# Patient Record
Sex: Female | Born: 1945
Health system: Southern US, Community
[De-identification: ages and names within clinical notes are randomized; demographics above are authoritative.]

## PROBLEM LIST (undated history)

## (undated) DIAGNOSIS — M5136 Other intervertebral disc degeneration, lumbar region: Secondary | ICD-10-CM

## (undated) DIAGNOSIS — E039 Hypothyroidism, unspecified: Secondary | ICD-10-CM

## (undated) DIAGNOSIS — G20 Parkinson's disease: Secondary | ICD-10-CM

## (undated) DIAGNOSIS — M51369 Other intervertebral disc degeneration, lumbar region without mention of lumbar back pain or lower extremity pain: Secondary | ICD-10-CM

## (undated) DIAGNOSIS — G20A1 Parkinson's disease without dyskinesia, without mention of fluctuations: Secondary | ICD-10-CM

## (undated) DIAGNOSIS — IMO0002 Reserved for concepts with insufficient information to code with codable children: Secondary | ICD-10-CM

## (undated) DIAGNOSIS — E785 Hyperlipidemia, unspecified: Secondary | ICD-10-CM

## (undated) DIAGNOSIS — R87629 Unspecified abnormal cytological findings in specimens from vagina: Secondary | ICD-10-CM

## (undated) DIAGNOSIS — I10 Essential (primary) hypertension: Secondary | ICD-10-CM

## (undated) DIAGNOSIS — K649 Unspecified hemorrhoids: Secondary | ICD-10-CM

## (undated) DIAGNOSIS — E079 Disorder of thyroid, unspecified: Secondary | ICD-10-CM

## (undated) DIAGNOSIS — R87619 Unspecified abnormal cytological findings in specimens from cervix uteri: Secondary | ICD-10-CM

## (undated) DIAGNOSIS — F419 Anxiety disorder, unspecified: Secondary | ICD-10-CM

## (undated) HISTORY — DX: Disorder of thyroid, unspecified: E07.9

## (undated) HISTORY — DX: Unspecified hemorrhoids: K64.9

## (undated) HISTORY — PX: OTHER SURGICAL HISTORY: SHX169

## (undated) HISTORY — PX: EYE SURGERY: SHX253

## (undated) HISTORY — DX: Reserved for concepts with insufficient information to code with codable children: IMO0002

## (undated) HISTORY — DX: Unspecified abnormal cytological findings in specimens from vagina: R87.629

## (undated) HISTORY — DX: Unspecified abnormal cytological findings in specimens from cervix uteri: R87.619

---

## 2000-10-20 ENCOUNTER — Encounter: Payer: Self-pay | Admitting: Obstetrics and Gynecology

## 2000-10-20 ENCOUNTER — Ambulatory Visit (HOSPITAL_COMMUNITY): Admission: RE | Admit: 2000-10-20 | Discharge: 2000-10-20 | Payer: Self-pay | Admitting: Obstetrics and Gynecology

## 2001-10-26 ENCOUNTER — Encounter: Payer: Self-pay | Admitting: Obstetrics and Gynecology

## 2001-10-26 ENCOUNTER — Ambulatory Visit (HOSPITAL_COMMUNITY): Admission: RE | Admit: 2001-10-26 | Discharge: 2001-10-26 | Payer: Self-pay | Admitting: Obstetrics and Gynecology

## 2002-11-01 ENCOUNTER — Encounter: Payer: Self-pay | Admitting: Obstetrics and Gynecology

## 2002-11-01 ENCOUNTER — Ambulatory Visit (HOSPITAL_COMMUNITY): Admission: RE | Admit: 2002-11-01 | Discharge: 2002-11-01 | Payer: Self-pay | Admitting: Obstetrics and Gynecology

## 2003-11-15 ENCOUNTER — Ambulatory Visit (HOSPITAL_COMMUNITY): Admission: RE | Admit: 2003-11-15 | Discharge: 2003-11-15 | Payer: Self-pay | Admitting: Obstetrics and Gynecology

## 2003-11-23 ENCOUNTER — Encounter: Admission: RE | Admit: 2003-11-23 | Discharge: 2003-11-23 | Payer: Self-pay | Admitting: Obstetrics and Gynecology

## 2004-11-26 ENCOUNTER — Encounter: Admission: RE | Admit: 2004-11-26 | Discharge: 2004-11-26 | Payer: Self-pay | Admitting: Obstetrics and Gynecology

## 2005-11-16 ENCOUNTER — Encounter (INDEPENDENT_AMBULATORY_CARE_PROVIDER_SITE_OTHER): Payer: Self-pay | Admitting: *Deleted

## 2005-11-16 ENCOUNTER — Ambulatory Visit (HOSPITAL_COMMUNITY): Admission: RE | Admit: 2005-11-16 | Discharge: 2005-11-16 | Payer: Self-pay | Admitting: General Surgery

## 2005-11-30 ENCOUNTER — Encounter: Admission: RE | Admit: 2005-11-30 | Discharge: 2005-11-30 | Payer: Self-pay | Admitting: Obstetrics and Gynecology

## 2006-01-19 HISTORY — PX: OTHER SURGICAL HISTORY: SHX169

## 2006-03-02 ENCOUNTER — Ambulatory Visit (HOSPITAL_COMMUNITY): Admission: RE | Admit: 2006-03-02 | Discharge: 2006-03-02 | Payer: Self-pay | Admitting: Obstetrics and Gynecology

## 2006-03-11 ENCOUNTER — Encounter: Admission: RE | Admit: 2006-03-11 | Discharge: 2006-03-11 | Payer: Self-pay | Admitting: Obstetrics and Gynecology

## 2006-05-03 ENCOUNTER — Ambulatory Visit: Payer: Self-pay | Admitting: Internal Medicine

## 2006-05-03 ENCOUNTER — Ambulatory Visit (HOSPITAL_COMMUNITY): Admission: RE | Admit: 2006-05-03 | Discharge: 2006-05-03 | Payer: Self-pay | Admitting: Internal Medicine

## 2006-11-08 ENCOUNTER — Encounter: Admission: RE | Admit: 2006-11-08 | Discharge: 2006-11-08 | Payer: Self-pay | Admitting: Obstetrics and Gynecology

## 2007-01-20 HISTORY — PX: COLONOSCOPY: SHX174

## 2007-09-23 ENCOUNTER — Other Ambulatory Visit: Admission: RE | Admit: 2007-09-23 | Discharge: 2007-09-23 | Payer: Self-pay | Admitting: Obstetrics and Gynecology

## 2007-11-09 ENCOUNTER — Encounter: Admission: RE | Admit: 2007-11-09 | Discharge: 2007-11-09 | Payer: Self-pay | Admitting: Obstetrics and Gynecology

## 2009-01-01 ENCOUNTER — Encounter: Admission: RE | Admit: 2009-01-01 | Discharge: 2009-01-01 | Payer: Self-pay | Admitting: Obstetrics and Gynecology

## 2009-04-26 ENCOUNTER — Other Ambulatory Visit: Admission: RE | Admit: 2009-04-26 | Discharge: 2009-04-26 | Payer: Self-pay | Admitting: Obstetrics and Gynecology

## 2010-01-31 ENCOUNTER — Encounter
Admission: RE | Admit: 2010-01-31 | Discharge: 2010-01-31 | Payer: Self-pay | Source: Home / Self Care | Attending: Obstetrics and Gynecology | Admitting: Obstetrics and Gynecology

## 2010-02-08 ENCOUNTER — Encounter: Payer: Self-pay | Admitting: Obstetrics and Gynecology

## 2010-02-08 ENCOUNTER — Encounter: Payer: Self-pay | Admitting: Internal Medicine

## 2010-02-09 ENCOUNTER — Encounter: Payer: Self-pay | Admitting: Obstetrics and Gynecology

## 2010-06-06 NOTE — Op Note (Signed)
NAMEJONAYA, Julie Farrell                 ACCOUNT NO.:  [id number]   MEDICAL RECORD NO.:  [id number]          PATIENT TYPE:  AMB   LOCATION:  DAY                           FACILITY:  [facility]   PHYSICIAN:  Lionel December, M.D.    DATE OF BIRTH:  [birth year]   DATE OF PROCEDURE:  05/03/2006  DATE OF DISCHARGE:                               OPERATIVE REPORT   PROCEDURE:  Colonoscopy.   INDICATIONS FOR PROCEDURE:  [name] is a 65 year old Caucasian female who  is undergoing evaluation screening colonoscopy.  Procedure risks were  reviewed with the patient and informed consent was obtained.   MEDICATIONS FOR CONSCIOUS SEDATION:  Demerol 50 mg IV and Versed 8 mg IV  in divided dose.   FINDINGS:  Procedure performed in endoscopy suite.  The patient's vital  signs and O2 sat were monitored during the procedure and remained  stable.  The patient was placed in the left lateral position and rectal  examination performed.  No abnormality noted on external or digital  exam.  The Pentax video scope was placed in the rectum and advanced  under direct vision into the sigmoid colon beyond.  Preparation was  excellent.  The scope was passed through the cecum which was identified  by the appendiceal orifice and ileocecal valve.  Short segment of TI was  also examined and was normal.  Colonic mucosa was examined carefully on  the way out.  No polyps or other mucosal abnormalities were noted.  Rectal mucosa similarly was normal.  The scope was retroflexed and  examined anorectal junction which is unremarkable.  The endoscope was  then withdrawn.  The patient tolerated the procedure well.   FINAL DIAGNOSIS:  Normal colonoscopy.   RECOMMENDATIONS:  1. Yearly Hemoccult.  2. Consider next screening exam in 10 years from now.      Lionel December, M.D.  Electronically Signed     NR/MEDQ  D:  05/03/2006  T:  05/03/2006  Job:  [id number]   cc:   Tilda Burrow, M.D.  Fax: [phone]

## 2010-06-06 NOTE — Op Note (Signed)
NAMESHATASHA, LAMBING                 ACCOUNT NO.:  [id number]   MEDICAL RECORD NO.:  [id number]          PATIENT TYPE:  AMB   LOCATION:  DAY                           FACILITY:  [facility]   PHYSICIAN:  Dalia Heading, M.D.  DATE OF BIRTH:  09-30-1945   DATE OF PROCEDURE:  11/16/2005  DATE OF DISCHARGE:                                 OPERATIVE REPORT   PREOPERATIVE DIAGNOSIS:  Neoplasm, left arm.   POSTOPERATIVE DIAGNOSIS:  Neoplasm, left arm.   PROCEDURE:  Excision of neoplasm, left arm.   SURGEON:  Dr. Franky Macho   ANESTHESIA:  General.   INDICATIONS:  The patient is a 65 year old white female who presents with an  enlarging subcutaneous mass in the left arm.  Risks and benefits of the  procedure including bleeding, infection, recurrence of the mass were fully  explained to the patient, who gave informed consent.   PROCEDURE NOTE:  The patient was placed in supine position.  The left arm  was prepped and draped in the usual sterile technique with Betadine.  Surgical site confirmation was performed.   A longitudinal incision was made on the lateral aspect of the left arm.  This was taken down to the mass.  The mass was excised without difficulty.  The appeared to be a lipoma, greater than 4 cm its greatest diameter.  The  specimen sent to pathology further examination.  Any bleeding was controlled  using Bovie electrocautery.  The subcutaneous layer was reapproximated using  a 3-0 Vicryl interrupted suture.  Skin was closed using a 4-0 Vicryl  subcuticular suture.  0.5% Sensorcaine was instilled in the surrounding  wound.  Dermabond was then applied.   All tape and needle counts correct the end the procedure.  The patient was  awakened and transferred to PACU in stable condition.   COMPLICATIONS:  None.   SPECIMEN:  Neoplasm, left arm.   BLOOD LOSS:  Minimal.      Dalia Heading, M.D.  Electronically Signed     MAJ/MEDQ  D:  11/16/2005  T:  11/16/2005  Job:   [id number]   cc:   Corrie Mckusick, M.D.  Fax: [phone]

## 2010-06-06 NOTE — H&P (Signed)
Julie Farrell, Julie Farrell                 ACCOUNT NO.:  [id number]   MEDICAL RECORD NO.:  [id number]          PATIENT TYPE:  AMB   LOCATION:                                FACILITY:  [facility]   PHYSICIAN:  Dalia Heading, M.D.  DATE OF BIRTH:  1945/09/02   DATE OF ADMISSION:  11/16/2005  DATE OF DISCHARGE:  LH                                HISTORY & PHYSICAL   CHIEF COMPLAINT:  Mass, left arm.   HISTORY OF PRESENT ILLNESS:  The patient is a 65 year old white female who  is referred for evaluation and treatment of a mass on the left arm.  It has  been present for some time, but is increasing in size and is causing her  discomfort.   PAST MEDICAL HISTORY:  Unremarkable.   PAST SURGICAL HISTORY:  Unremarkable.   CURRENT MEDICATIONS:  None.   ALLERGIES:  NO KNOWN DRUG ALLERGIES.   REVIEW OF SYSTEMS:  Noncontributory.   PHYSICAL EXAMINATION:  GENERAL:  The patient is a well-developed, well-  nourished white female in no acute distress.  LUNGS:  Clear to auscultation with equal breath sounds bilaterally.  HEART:  Examination reveals a regular rate and rhythm without S3, S4, or  murmurs.  EXTREMITIES:  Left arm exam reveals a 4-cm ovoid, deep, subcutaneous mass  present in the posterolateral aspect of the arm.   IMPRESSION:  Neoplasm, left arm, unspecified.   PLAN:  The patient is scheduled for an excision of the neoplasm, left arm,  on November 16, 2005.  The risks and benefits of the procedure including  bleeding, infection, and recurrence of the mass were fully explained to the  patient, who gave informed consent.      Dalia Heading, M.D.  Electronically Signed     MAJ/MEDQ  D:  11/10/2005  T:  11/11/2005  Job:  [id number]   cc:   Corrie Mckusick, M.D.  Fax: [phone]

## 2010-06-06 NOTE — Procedures (Signed)
NAMEJAKIYA, Julie Farrell                 ACCOUNT NO.:  [id number]   MEDICAL RECORD NO.:  [id number]          PATIENT TYPE:  AMB   LOCATION:  DAY                           FACILITY:  [facility]   PHYSICIAN:  Edward L. Juanetta Gosling, M.D.DATE OF BIRTH:  1945/01/29   DATE OF PROCEDURE:  11/12/2005  DATE OF DISCHARGE:                                EKG INTERPRETATION   The rhythm is sinus rhythm with a rate in the 80s.  There is left anterior  hemiblock and suggestion of a probable left bundle branch block.  There is  left atrial enlargement.  Q-waves were seen across the precordial leads that  is probably related to the bundle branch block which could indicate a  previous infarction.  Clinical correlation is suggested.  Abnormal  electrocardiogram.      Oneal Deputy. Juanetta Gosling, M.D.  Electronically Signed     ELH/MEDQ  D:  11/12/2005  T:  11/14/2005  Job:  [id number]

## 2010-08-21 ENCOUNTER — Other Ambulatory Visit (HOSPITAL_COMMUNITY)
Admission: RE | Admit: 2010-08-21 | Discharge: 2010-08-21 | Disposition: A | Discharge status: Home / Self Care | Payer: Self-pay | Source: Ambulatory Visit | Attending: Obstetrics and Gynecology | Admitting: Obstetrics and Gynecology

## 2010-08-21 ENCOUNTER — Other Ambulatory Visit: Payer: Self-pay | Admitting: Adult Health

## 2010-08-21 DIAGNOSIS — Z01419 Encounter for gynecological examination (general) (routine) without abnormal findings: Secondary | ICD-10-CM | POA: Insufficient documentation

## 2011-02-02 ENCOUNTER — Other Ambulatory Visit: Payer: Self-pay | Admitting: Obstetrics and Gynecology

## 2011-02-02 DIAGNOSIS — Z1231 Encounter for screening mammogram for malignant neoplasm of breast: Secondary | ICD-10-CM

## 2011-02-23 ENCOUNTER — Ambulatory Visit
Admission: RE | Admit: 2011-02-23 | Discharge: 2011-02-23 | Disposition: A | Discharge status: Home / Self Care | Payer: 59 | Source: Ambulatory Visit | Attending: Obstetrics and Gynecology | Admitting: Obstetrics and Gynecology

## 2011-02-23 DIAGNOSIS — Z1231 Encounter for screening mammogram for malignant neoplasm of breast: Secondary | ICD-10-CM

## 2011-11-10 ENCOUNTER — Other Ambulatory Visit (HOSPITAL_COMMUNITY): Payer: Self-pay | Admitting: Family Medicine

## 2011-11-10 DIAGNOSIS — Z139 Encounter for screening, unspecified: Secondary | ICD-10-CM

## 2011-11-13 ENCOUNTER — Other Ambulatory Visit (HOSPITAL_COMMUNITY): Payer: 59

## 2011-11-17 ENCOUNTER — Other Ambulatory Visit (HOSPITAL_COMMUNITY): Payer: 59

## 2011-11-24 ENCOUNTER — Ambulatory Visit (HOSPITAL_COMMUNITY)
Admission: RE | Admit: 2011-11-24 | Discharge: 2011-11-24 | Disposition: A | Discharge status: Home / Self Care | Payer: Medicare Other | Source: Ambulatory Visit | Attending: Family Medicine | Admitting: Family Medicine

## 2011-11-24 DIAGNOSIS — M899 Disorder of bone, unspecified: Secondary | ICD-10-CM | POA: Insufficient documentation

## 2011-11-24 DIAGNOSIS — Z780 Asymptomatic menopausal state: Secondary | ICD-10-CM | POA: Insufficient documentation

## 2011-11-24 DIAGNOSIS — Z139 Encounter for screening, unspecified: Secondary | ICD-10-CM

## 2011-11-24 DIAGNOSIS — M949 Disorder of cartilage, unspecified: Secondary | ICD-10-CM | POA: Insufficient documentation

## 2012-01-28 ENCOUNTER — Other Ambulatory Visit: Payer: Self-pay | Admitting: Obstetrics and Gynecology

## 2012-01-28 DIAGNOSIS — Z1231 Encounter for screening mammogram for malignant neoplasm of breast: Secondary | ICD-10-CM

## 2012-02-29 ENCOUNTER — Ambulatory Visit
Admission: RE | Admit: 2012-02-29 | Discharge: 2012-02-29 | Disposition: A | Discharge status: Home / Self Care | Payer: Medicare Other | Source: Ambulatory Visit | Attending: Obstetrics and Gynecology | Admitting: Obstetrics and Gynecology

## 2012-02-29 DIAGNOSIS — Z1231 Encounter for screening mammogram for malignant neoplasm of breast: Secondary | ICD-10-CM

## 2012-04-27 ENCOUNTER — Other Ambulatory Visit: Payer: Self-pay | Admitting: Adult Health

## 2012-05-10 ENCOUNTER — Encounter: Payer: Self-pay | Admitting: *Deleted

## 2012-05-10 ENCOUNTER — Other Ambulatory Visit: Payer: Self-pay

## 2012-05-10 ENCOUNTER — Encounter (HOSPITAL_COMMUNITY)
Admission: RE | Admit: 2012-05-10 | Discharge: 2012-05-10 | Disposition: A | Discharge status: Home / Self Care | Payer: Medicare Other | Source: Ambulatory Visit | Attending: Ophthalmology | Admitting: Ophthalmology

## 2012-05-10 ENCOUNTER — Encounter (HOSPITAL_COMMUNITY): Payer: Self-pay

## 2012-05-10 ENCOUNTER — Encounter (HOSPITAL_COMMUNITY): Payer: Self-pay | Admitting: Pharmacy Technician

## 2012-05-10 HISTORY — DX: Hyperlipidemia, unspecified: E78.5

## 2012-05-10 LAB — HEMOGLOBIN AND HEMATOCRIT, BLOOD: HCT: 39.3 % (ref 36.0–46.0)

## 2012-05-10 NOTE — Patient Instructions (Addendum)
Your procedure is scheduled on:  05/16/12  Report to [facility] at 8:00 AM.  Call this number if you have problems the morning of surgery: [phone]   Remember:   Do not eat or drink:After Midnight.  Take these medicines the morning of surgery with A SIP OF WATER: None   Do not wear jewelry, make-up or nail polish.  Do not wear lotions, powders, or perfumes. You may wear deodorant.  Do not shave 48 hours prior to surgery. Men may shave face and neck.  Do not bring valuables to the hospital.  Contacts, dentures or bridgework may not be worn into surgery.  Leave suitcase in the car. After surgery it may be brought to your room.  For patients admitted to the hospital, checkout time is 11:00 AM the day of discharge.   Patients discharged the day of surgery will not be allowed to drive home.    Special Instructions: Start using your eye drops before surgery as directed by your eye doctor.   Please read over the following fact sheets that you were given: Anesthesia Post-op Instructions    Cataract Surgery  A cataract is a clouding of the lens of the eye. When a lens becomes cloudy, vision is reduced based on the degree and nature of the clouding. Surgery may be needed to improve vision. Surgery removes the cloudy lens and usually replaces it with a substitute lens (intraocular lens, IOL). LET YOUR EYE DOCTOR KNOW ABOUT:  Allergies to food or medicine.  Medicines taken including herbs, eyedrops, over-the-counter medicines, and creams.  Use of steroids (by mouth or creams).  Previous problems with anesthetics or numbing medicine.  History of bleeding problems or blood clots.  Previous surgery.  Other health problems, including diabetes and kidney problems.  Possibility of pregnancy, if this applies. RISKS AND COMPLICATIONS  Infection.  Inflammation of the eyeball (endophthalmitis) that can spread to both eyes (sympathetic ophthalmia).  Poor wound healing.  If an IOL is  inserted, it can later fall out of proper position. This is very uncommon.  Clouding of the part of your eye that holds an IOL in place. This is called an "after-cataract." These are uncommon, but easily treated. BEFORE THE PROCEDURE  Do not eat or drink anything except small amounts of water for 8 to 12 before your surgery, or as directed by your caregiver.  Unless you are told otherwise, continue any eyedrops you have been prescribed.  Talk to your primary caregiver about all other medicines that you take (both prescription and non-prescription). In some cases, you may need to stop or change medicines near the time of your surgery. This is most important if you are taking blood-thinning medicine.Do not stop medicines unless you are told to do so.  Arrange for someone to drive you to and from the procedure.  Do not put contact lenses in either eye on the day of your surgery. PROCEDURE There is more than one method for safely removing a cataract. Your doctor can explain the differences and help determine which is best for you. Phacoemulsification surgery is the most common form of cataract surgery.  An injection is given behind the eye or eyedrops are given to make this a painless procedure.  A small cut (incision) is made on the edge of the clear, dome-shaped surface that covers the front of the eye (cornea).  A tiny probe is painlessly inserted into the eye. This device gives off ultrasound waves that soften and break up the cloudy  center of the lens. This makes it easier for the cloudy lens to be removed by suction.  An IOL may be implanted.  The normal lens of the eye is covered by a clear capsule. Part of that capsule is intentionally left in the eye to support the IOL.  Your surgeon may or may not use stitches to close the incision. There are other forms of cataract surgery that require a larger incision and stiches to close the eye. This approach is taken in cases where the  doctor feels that the cataract cannot be easily removed using phacoemulsification. AFTER THE PROCEDURE  When an IOL is implanted, it does not need care. It becomes a permanent part of your eye and cannot be seen or felt.  Your doctor will schedule follow-up exams to check on your progress.  Review your other medicines with your doctor to see which can be resumed after surgery.  Use eyedrops or take medicine as prescribed by your doctor. Document Released: 12/25/2010 Document Revised: 03/30/2011 Document Reviewed: 12/25/2010 [facility] Patient Information 2013 [place], [place].    PATIENT INSTRUCTIONS POST-ANESTHESIA  IMMEDIATELY FOLLOWING SURGERY:  Do not drive or operate machinery for the first twenty four hours after surgery.  Do not make any important decisions for twenty four hours after surgery or while taking narcotic pain medications or sedatives.  If you develop intractable nausea and vomiting or a severe headache please notify your doctor immediately.  FOLLOW-UP:  Please make an appointment with your surgeon as instructed. You do not need to follow up with anesthesia unless specifically instructed to do so.  WOUND CARE INSTRUCTIONS (if applicable):  Keep a dry clean dressing on the anesthesia/puncture wound site if there is drainage.  Once the wound has quit draining you may leave it open to air.  Generally you should leave the bandage intact for twenty four hours unless there is drainage.  If the epidural site drains for more than 36-48 hours please call the anesthesia department.  QUESTIONS?:  Please feel free to call your physician or the hospital operator if you have any questions, and they will be happy to assist you.

## 2012-05-11 ENCOUNTER — Other Ambulatory Visit (HOSPITAL_COMMUNITY)
Admission: RE | Admit: 2012-05-11 | Discharge: 2012-05-11 | Disposition: A | Discharge status: Home / Self Care | Payer: Medicare Other | Source: Ambulatory Visit | Attending: Adult Health | Admitting: Adult Health

## 2012-05-11 ENCOUNTER — Encounter (HOSPITAL_COMMUNITY): Payer: Self-pay | Admitting: Pharmacy Technician

## 2012-05-11 ENCOUNTER — Encounter: Payer: Self-pay | Admitting: Adult Health

## 2012-05-11 ENCOUNTER — Ambulatory Visit (INDEPENDENT_AMBULATORY_CARE_PROVIDER_SITE_OTHER): Payer: Medicare Other | Admitting: Adult Health

## 2012-05-11 VITALS — BP 130/82 | HR 76 | Ht 62.0 in | Wt 133.0 lb

## 2012-05-11 DIAGNOSIS — Z124 Encounter for screening for malignant neoplasm of cervix: Secondary | ICD-10-CM

## 2012-05-11 DIAGNOSIS — Z1212 Encounter for screening for malignant neoplasm of rectum: Secondary | ICD-10-CM

## 2012-05-11 DIAGNOSIS — Z01419 Encounter for gynecological examination (general) (routine) without abnormal findings: Secondary | ICD-10-CM | POA: Insufficient documentation

## 2012-05-11 DIAGNOSIS — Z0000 Encounter for general adult medical examination without abnormal findings: Secondary | ICD-10-CM

## 2012-05-11 DIAGNOSIS — R87810 Cervical high risk human papillomavirus (HPV) DNA test positive: Secondary | ICD-10-CM | POA: Insufficient documentation

## 2012-05-11 DIAGNOSIS — R5381 Other malaise: Secondary | ICD-10-CM

## 2012-05-11 DIAGNOSIS — Z1151 Encounter for screening for human papillomavirus (HPV): Secondary | ICD-10-CM | POA: Insufficient documentation

## 2012-05-11 DIAGNOSIS — H269 Unspecified cataract: Secondary | ICD-10-CM | POA: Insufficient documentation

## 2012-05-11 LAB — HEMOCCULT GUIAC POC 1CARD (OFFICE): Fecal Occult Blood, POC: NEGATIVE

## 2012-05-11 MED ORDER — PROVIDA OB 20-20-1.25 MG PO CAPS: 1.0000 | ORAL_CAPSULE | Freq: Every day | ORAL | Status: DC

## 2012-05-11 NOTE — Patient Instructions (Addendum)
Try vitamins mammogram yearly, colonoscopy in 2018 Follow up in 2 years  Sign up for my chart

## 2012-05-11 NOTE — Progress Notes (Signed)
Patient ID: Julie Farrell, female   DOB: [birth year], 67 y.o.   MRN: [id number] History of Present Illness: Julie Farrell is a 67 year old white female, divorced in for her pap and physical.She says she is tired, will get labs with Dr. Regino Schultze, declines today. Had pre op labs for cataracts to be done 05/16/12 at [facility], left eye.   Current Medications, Allergies, Past Medical History, Past Surgical History, Family History and Social History were reviewed in [employer] record.     Review of Systems:Patient denies any headaches, blurred vision, shortness of breath, chest pain, abdominal pain, problems with bowel movements, urination, she is not having sex. No musculoskeletal complaints or mood changes.She is tired at times and tried OTC geritol.    Physical Exam:Blood pressure 130/82, pulse 76, height 5\' 2"  (1.575 m), weight 133 lb (60.328 kg). General:  Well developed, well nourished, no acute distress Skin:  Warm and dry Neck:  Midline trachea, normal thyroid, no carotid bruits heard. Lungs; Clear to auscultation bilaterally Breast:  No dominant palpable mass, retraction, or nipple discharge(had 3D mammogram in [place]). Cardiovascular: Regular rate and rhythm Abdomen:  Soft, non tender, no hepatosplenomegaly Pelvic:  External genitalia is normal in appearance.  The vagina is atrophic. The cervix is atrophic. Pap done with HPV. Uterus is felt to be normal size, shape, and contour.  No adnexal masses or tenderness noted. Rectal: Good sphincter tone, no polyps, or hemorrhoids felt.  Hemoccult negative. Extremities:  No swelling or varicosities noted Psych:  No mood changes, alert and cooperative.   Impression: Yearly exam  Left cataract Fatigue  Plan: Try provida ob to see if feels better with vitamin Get labs with Dr. Regino Schultze Mammogram yearly Colonoscopy in 2018 Return in 2 years for pap and physical,prn problems

## 2012-05-13 MED ORDER — CYCLOPENTOLATE-PHENYLEPHRINE 0.2-1 % OP SOLN
OPHTHALMIC | Status: AC
  Filled 2012-05-13: qty 2

## 2012-05-13 MED ORDER — TETRACAINE HCL 0.5 % OP SOLN
OPHTHALMIC | Status: AC
  Filled 2012-05-13: qty 2

## 2012-05-13 MED ORDER — LIDOCAINE HCL 3.5 % OP GEL
OPHTHALMIC | Status: AC
  Filled 2012-05-13: qty 5

## 2012-05-13 MED ORDER — NEOMYCIN-POLYMYXIN-DEXAMETH 3.5-10000-0.1 OP OINT
TOPICAL_OINTMENT | OPHTHALMIC | Status: AC
  Filled 2012-05-13: qty 3.5

## 2012-05-13 MED ORDER — LIDOCAINE HCL (PF) 1 % IJ SOLN
INTRAMUSCULAR | Status: AC
  Filled 2012-05-13: qty 2

## 2012-05-13 MED ORDER — PHENYLEPHRINE HCL 2.5 % OP SOLN
OPHTHALMIC | Status: AC
  Filled 2012-05-13: qty 2

## 2012-05-16 ENCOUNTER — Encounter (HOSPITAL_COMMUNITY): Payer: Self-pay | Admitting: *Deleted

## 2012-05-16 ENCOUNTER — Encounter (HOSPITAL_COMMUNITY)
Admission: RE | Disposition: A | Discharge status: Home Health | Payer: Self-pay | Source: Ambulatory Visit | Attending: Ophthalmology

## 2012-05-16 ENCOUNTER — Encounter (HOSPITAL_COMMUNITY): Payer: Self-pay | Admitting: Anesthesiology

## 2012-05-16 ENCOUNTER — Ambulatory Visit (HOSPITAL_COMMUNITY): Payer: Medicare Other | Admitting: Anesthesiology

## 2012-05-16 ENCOUNTER — Ambulatory Visit (HOSPITAL_COMMUNITY)
Admission: RE | Admit: 2012-05-16 | Discharge: 2012-05-16 | Disposition: A | Discharge status: Home Health | Payer: Medicare Other | Source: Ambulatory Visit | Attending: Ophthalmology | Admitting: Ophthalmology

## 2012-05-16 DIAGNOSIS — H2510 Age-related nuclear cataract, unspecified eye: Secondary | ICD-10-CM | POA: Insufficient documentation

## 2012-05-16 DIAGNOSIS — Z01810 Encounter for preprocedural cardiovascular examination: Secondary | ICD-10-CM | POA: Insufficient documentation

## 2012-05-16 DIAGNOSIS — Z01812 Encounter for preprocedural laboratory examination: Secondary | ICD-10-CM | POA: Insufficient documentation

## 2012-05-16 DIAGNOSIS — H52209 Unspecified astigmatism, unspecified eye: Secondary | ICD-10-CM | POA: Insufficient documentation

## 2012-05-16 HISTORY — PX: CATARACT EXTRACTION W/PHACO: SHX586

## 2012-05-16 SURGERY — PHACOEMULSIFICATION, CATARACT, WITH IOL INSERTION
Anesthesia: Monitor Anesthesia Care | Site: Eye | Laterality: Left | Wound class: Clean

## 2012-05-16 MED ORDER — BSS IO SOLN
INTRAOCULAR | Status: DC | PRN
  Administered 2012-05-16: 15 mL via INTRAOCULAR

## 2012-05-16 MED ORDER — LACTATED RINGERS IV SOLN
INTRAVENOUS | Status: DC
  Administered 2012-05-16: 09:00:00 via INTRAVENOUS

## 2012-05-16 MED ORDER — PROVISC 10 MG/ML IO SOLN
INTRAOCULAR | Status: DC | PRN
  Administered 2012-05-16: 8.5 mg via INTRAOCULAR

## 2012-05-16 MED ORDER — MIDAZOLAM HCL 2 MG/2ML IJ SOLN
INTRAMUSCULAR | Status: AC
  Filled 2012-05-16: qty 2

## 2012-05-16 MED ORDER — MIDAZOLAM HCL 2 MG/2ML IJ SOLN
1.0000 mg | INTRAMUSCULAR | Status: DC | PRN
  Administered 2012-05-16 (×2): 2 mg via INTRAVENOUS

## 2012-05-16 MED ORDER — LACTATED RINGERS IV SOLN
INTRAVENOUS | Status: DC | PRN
  Administered 2012-05-16: 09:00:00 via INTRAVENOUS

## 2012-05-16 MED ORDER — LIDOCAINE HCL (PF) 1 % IJ SOLN
INTRAMUSCULAR | Status: DC | PRN
  Administered 2012-05-16: .7 mL

## 2012-05-16 MED ORDER — PHENYLEPHRINE HCL 2.5 % OP SOLN
1.0000 [drp] | OPHTHALMIC | Status: AC
  Administered 2012-05-16 (×3): 1 [drp] via OPHTHALMIC

## 2012-05-16 MED ORDER — POVIDONE-IODINE 5 % OP SOLN
OPHTHALMIC | Status: DC | PRN
  Administered 2012-05-16: 1 via OPHTHALMIC

## 2012-05-16 MED ORDER — CYCLOPENTOLATE-PHENYLEPHRINE 0.2-1 % OP SOLN
1.0000 [drp] | OPHTHALMIC | Status: AC
  Administered 2012-05-16 (×3): 1 [drp] via OPHTHALMIC

## 2012-05-16 MED ORDER — EPINEPHRINE HCL 1 MG/ML IJ SOLN
INTRAOCULAR | Status: DC | PRN
  Administered 2012-05-16: 10:00:00

## 2012-05-16 MED ORDER — EPINEPHRINE HCL 1 MG/ML IJ SOLN
INTRAMUSCULAR | Status: AC
  Filled 2012-05-16: qty 1

## 2012-05-16 MED ORDER — LIDOCAINE HCL 3.5 % OP GEL
1.0000 "application " | Freq: Once | OPHTHALMIC | Status: AC
  Administered 2012-05-16: 1 via OPHTHALMIC

## 2012-05-16 MED ORDER — TETRACAINE HCL 0.5 % OP SOLN
1.0000 [drp] | OPHTHALMIC | Status: AC
  Administered 2012-05-16 (×3): 1 [drp] via OPHTHALMIC

## 2012-05-16 MED ORDER — NEOMYCIN-POLYMYXIN-DEXAMETH 0.1 % OP OINT
TOPICAL_OINTMENT | OPHTHALMIC | Status: DC | PRN
  Administered 2012-05-16: 1 via OPHTHALMIC

## 2012-05-16 SURGICAL SUPPLY — 32 items
CAPSULAR TENSION RING-AMO (OPHTHALMIC RELATED) IMPLANT
CLOTH BEACON ORANGE TIMEOUT ST (SAFETY) ×2 IMPLANT
EYE SHIELD UNIVERSAL CLEAR (GAUZE/BANDAGES/DRESSINGS) ×2 IMPLANT
GLOVE BIO SURGEON STRL SZ 6.5 (GLOVE) IMPLANT
GLOVE BIOGEL PI IND STRL 6.5 (GLOVE) ×1 IMPLANT
GLOVE BIOGEL PI IND STRL 7.0 (GLOVE) ×1 IMPLANT
GLOVE BIOGEL PI IND STRL 7.5 (GLOVE) IMPLANT
GLOVE BIOGEL PI INDICATOR 6.5 (GLOVE) ×1
GLOVE BIOGEL PI INDICATOR 7.0 (GLOVE) ×1
GLOVE BIOGEL PI INDICATOR 7.5 (GLOVE)
GLOVE ECLIPSE 6.5 STRL STRAW (GLOVE) IMPLANT
GLOVE ECLIPSE 7.0 STRL STRAW (GLOVE) IMPLANT
GLOVE ECLIPSE 7.5 STRL STRAW (GLOVE) IMPLANT
GLOVE EXAM NITRILE LRG STRL (GLOVE) IMPLANT
GLOVE EXAM NITRILE MD LF STRL (GLOVE) IMPLANT
GLOVE SKINSENSE NS SZ6.5 (GLOVE)
GLOVE SKINSENSE NS SZ7.0 (GLOVE)
GLOVE SKINSENSE STRL SZ6.5 (GLOVE) IMPLANT
GLOVE SKINSENSE STRL SZ7.0 (GLOVE) IMPLANT
KIT VITRECTOMY (OPHTHALMIC RELATED) IMPLANT
LENS IOL ACRYSOF IQ TORIC 15.0 ×2 IMPLANT
PAD ARMBOARD 7.5X6 YLW CONV (MISCELLANEOUS) ×2 IMPLANT
PROC W NO LENS (INTRAOCULAR LENS)
PROC W SPEC LENS (INTRAOCULAR LENS) ×2
PROCESS W NO LENS (INTRAOCULAR LENS) IMPLANT
PROCESS W SPEC LENS (INTRAOCULAR LENS) ×1 IMPLANT
RING MALYGIN (MISCELLANEOUS) IMPLANT
SYR TB 1ML LL NO SAFETY (SYRINGE) ×2 IMPLANT
TAPE SURG TRANSPORE 1 IN (GAUZE/BANDAGES/DRESSINGS) IMPLANT
TAPE SURGICAL TRANSPORE 1 IN (GAUZE/BANDAGES/DRESSINGS) ×1
VISCOELASTIC ADDITIONAL (OPHTHALMIC RELATED) IMPLANT
WATER STERILE IRR 250ML POUR (IV SOLUTION) ×2 IMPLANT

## 2012-05-16 NOTE — Anesthesia Postprocedure Evaluation (Signed)
  Anesthesia Post-op Note  Patient: Julie Farrell  Procedure(s) Performed: Procedure(s) with comments: CATARACT EXTRACTION PHACO AND INTRAOCULAR LENS PLACEMENT (IOC) (Left) - CDE:15.20  Patient Location: Short Stay  Anesthesia Type:MAC  Level of Consciousness: awake, alert , oriented and patient cooperative  Airway and Oxygen Therapy: Patient Spontanous Breathing  Post-op Pain: none  Post-op Assessment: Post-op Vital signs reviewed, Patient's Cardiovascular Status Stable, Respiratory Function Stable, Patent Airway and No signs of Nausea or vomiting  Post-op Vital Signs: Reviewed and stable  Complications: No apparent anesthesia complications

## 2012-05-16 NOTE — Op Note (Signed)
Date of Admission: 05/16/12  Date of Surgery: 05/16/12  Pre-Op Dx: Cataract  Left Eye, Astigmatism 367.21  Post-Op Dx: Nuclear Cataract Left Eye, Dx Code 366.16, Astigmatism 367.21  Surgeon: Gemma Payor, M.D.  Assistants: None  Anesthesia: Topical with MAC  Indications: Painless, progressive loss of vision with compromise of daily activities.  Surgery: Cataract Extraction with Intraocular lens Implant Left Eye  Discription: The patient had dilating drops and viscous lidocaine placed into the left eye in the pre-op holding area. Marking of the horizontal meridian was performed. After transfer to the operating room, a time out was performed. The patient was then prepped and draped. Beginning with a 75 degree blade a paracentesis port was made at the surgeon's 2 o'clock position. The anterior chamber was then filled with 2% non-preserved lidocaine. This was followed by filling the anterior chamber with Provisc. A bent cystatome needle was used to create a continuous tear capsulotomy. Hydrodissection was performed with balanced salt solution on a Fine canula. The lens nucleus was then removed using the phacoemulsification handpiece. Residual cortex was removed with the I&A handpiece. The anterior chamber and capsular bag were refilled with Provisc. Marking on the 87/267 degree meridians were made. A posterior chamber intraocular lens was placed into the capsular bag with it's injector. The implant was positioned on the 87/267 meridians with the Kuglan hook. The Provisc was then removed from the anterior chamber and capsular bag with the I&A handpiece. Stromal hydration of the main incision and paracentesis port was performed with BSS on a Fine canula. The wounds were tested for leak which was negative. The patient tolerated the procedure well. There were no operative complications. The patient was then transferred to the recovery room in stable condition.  Prosthetic device:  STAAR Toric, [id number],  power 23.0/2.0D.  Specimen: None  EBL: None  Complications: None

## 2012-05-16 NOTE — H&P (Signed)
I have reviewed the H&P, the patient was re-examined, and I have identified no interval changes in medical condition and plan of care since the history and physical of record  

## 2012-05-16 NOTE — Anesthesia Procedure Notes (Signed)
Procedure Name: MAC Performed by: ANDRAZA, AMY L Pre-anesthesia Checklist: Patient identified, Timeout performed, Emergency Drugs available, Suction available and Patient being monitored Oxygen Delivery Method: Nasal cannula     

## 2012-05-16 NOTE — Preoperative (Signed)
Beta Blockers   Reason not to administer Beta Blockers:Not Applicable 

## 2012-05-16 NOTE — Transfer of Care (Signed)
Immediate Anesthesia Transfer of Care Note  Patient: Julie Farrell  Procedure(s) Performed: Procedure(s) with comments: CATARACT EXTRACTION PHACO AND INTRAOCULAR LENS PLACEMENT (IOC) (Left) - CDE:15.20  Patient Location: Short Stay  Anesthesia Type:MAC  Level of Consciousness: awake, alert , oriented and patient cooperative  Airway & Oxygen Therapy: Patient Spontanous Breathing  Post-op Assessment: Report given to PACU RN and Post -op Vital signs reviewed and stable  Post vital signs: Reviewed and stable  Complications: No apparent anesthesia complications

## 2012-05-16 NOTE — Anesthesia Preprocedure Evaluation (Signed)
Anesthesia Evaluation  Patient identified by MRN, date of birth, ID band Patient awake    Reviewed: Allergy & Precautions, H&P , NPO status , Patient's Chart, lab work & pertinent test results  History of Anesthesia Complications Negative for: history of anesthetic complications  Airway Mallampati: II TM Distance: >3 FB     Dental  (+) Teeth Intact   Pulmonary neg pulmonary ROS,  breath sounds clear to auscultation        Cardiovascular negative cardio ROS  Rhythm:Regular Rate:Normal     Neuro/Psych    GI/Hepatic negative GI ROS,   Endo/Other    Renal/GU      Musculoskeletal   Abdominal   Peds  Hematology   Anesthesia Other Findings   Reproductive/Obstetrics                           Anesthesia Physical Anesthesia Plan  ASA: I  Anesthesia Plan: MAC   Post-op Pain Management:    Induction: Intravenous  Airway Management Planned: Nasal Cannula  Additional Equipment:   Intra-op Plan:   Post-operative Plan:   Informed Consent: I have reviewed the patients History and Physical, chart, labs and discussed the procedure including the risks, benefits and alternatives for the proposed anesthesia with the patient or authorized representative who has indicated his/her understanding and acceptance.     Plan Discussed with:   Anesthesia Plan Comments:         Anesthesia Quick Evaluation

## 2012-05-19 ENCOUNTER — Encounter (HOSPITAL_COMMUNITY): Payer: Self-pay | Admitting: Ophthalmology

## 2012-05-19 ENCOUNTER — Telehealth: Payer: Self-pay | Admitting: Adult Health

## 2012-05-19 NOTE — Telephone Encounter (Signed)
Pt aware pap negative but +HPV will repeat pap next year

## 2012-05-20 ENCOUNTER — Telehealth: Payer: Self-pay | Admitting: Adult Health

## 2012-05-20 NOTE — Telephone Encounter (Signed)
Pt called back and wants to get colpo instead of waiting for pap repeat next year, Had + HPV, appt made for 05/23/12 with Dr. Despina Hidden

## 2012-05-23 ENCOUNTER — Encounter: Payer: Medicare Other | Admitting: Obstetrics & Gynecology

## 2012-05-30 ENCOUNTER — Encounter: Payer: Self-pay | Admitting: Obstetrics & Gynecology

## 2012-05-30 ENCOUNTER — Ambulatory Visit (INDEPENDENT_AMBULATORY_CARE_PROVIDER_SITE_OTHER): Payer: Medicare Other | Admitting: Obstetrics & Gynecology

## 2012-05-30 VITALS — BP 120/70 | Ht 63.0 in | Wt 133.0 lb

## 2012-05-30 DIAGNOSIS — R87810 Cervical high risk human papillomavirus (HPV) DNA test positive: Secondary | ICD-10-CM

## 2012-05-30 DIAGNOSIS — N879 Dysplasia of cervix uteri, unspecified: Secondary | ICD-10-CM

## 2012-05-30 NOTE — Progress Notes (Signed)
Patient ID: Julie Farrell, female   DOB: Sep 20, 1945, 67 y.o.   MRN: [id number] Patient had pap which was negative for cytology and positive for HR HPV  HPV id done today  Colposcopy was performed using acetic acid and was normal  No lesions No biopsy done  Follow up pap 1 year

## 2012-06-21 NOTE — Addendum Note (Signed)
Addended by: Criss Alvine on: 06/21/2012 04:52 PM   Modules accepted: Orders

## 2013-03-21 ENCOUNTER — Other Ambulatory Visit: Payer: Self-pay

## 2013-03-21 DIAGNOSIS — Z1231 Encounter for screening mammogram for malignant neoplasm of breast: Secondary | ICD-10-CM

## 2013-04-10 ENCOUNTER — Ambulatory Visit
Admission: RE | Admit: 2013-04-10 | Discharge: 2013-04-10 | Disposition: A | Discharge status: Home / Self Care | Payer: Self-pay | Source: Ambulatory Visit

## 2013-04-10 DIAGNOSIS — Z1231 Encounter for screening mammogram for malignant neoplasm of breast: Secondary | ICD-10-CM

## 2013-05-17 ENCOUNTER — Ambulatory Visit (INDEPENDENT_AMBULATORY_CARE_PROVIDER_SITE_OTHER): Payer: Commercial Managed Care - HMO | Admitting: Adult Health

## 2013-05-17 ENCOUNTER — Other Ambulatory Visit (HOSPITAL_COMMUNITY)
Admission: RE | Admit: 2013-05-17 | Discharge: 2013-05-17 | Disposition: A | Discharge status: Home / Self Care | Payer: Medicare Other | Source: Ambulatory Visit | Attending: Adult Health | Admitting: Adult Health

## 2013-05-17 ENCOUNTER — Encounter: Payer: Self-pay | Admitting: Adult Health

## 2013-05-17 ENCOUNTER — Other Ambulatory Visit (INDEPENDENT_AMBULATORY_CARE_PROVIDER_SITE_OTHER): Payer: Commercial Managed Care - HMO | Admitting: Adult Health

## 2013-05-17 VITALS — BP 144/88 | Ht 63.25 in | Wt 133.0 lb

## 2013-05-17 DIAGNOSIS — R87619 Unspecified abnormal cytological findings in specimens from cervix uteri: Secondary | ICD-10-CM | POA: Insufficient documentation

## 2013-05-17 DIAGNOSIS — R87810 Cervical high risk human papillomavirus (HPV) DNA test positive: Secondary | ICD-10-CM | POA: Insufficient documentation

## 2013-05-17 DIAGNOSIS — Z1151 Encounter for screening for human papillomavirus (HPV): Secondary | ICD-10-CM | POA: Insufficient documentation

## 2013-05-17 DIAGNOSIS — IMO0002 Reserved for concepts with insufficient information to code with codable children: Secondary | ICD-10-CM | POA: Insufficient documentation

## 2013-05-17 DIAGNOSIS — Z124 Encounter for screening for malignant neoplasm of cervix: Secondary | ICD-10-CM

## 2013-05-17 DIAGNOSIS — B977 Papillomavirus as the cause of diseases classified elsewhere: Secondary | ICD-10-CM

## 2013-05-17 HISTORY — DX: Reserved for concepts with insufficient information to code with codable children: IMO0002

## 2013-05-17 NOTE — Progress Notes (Signed)
Subjective:     Patient ID: Julie Farrell, female   DOB: 02-06-1945, 68 y.o.   MRN: [id number]  HPI Julie Farrell is a 68 year old white female,divoreced in for a  Pap.Her pap last year was negative but +HPV.  Review of Systems See HPI Reviewed past medical,surgical, social and family history. Reviewed medications and allergies.     Objective:   Physical Exam BP 144/88  Ht 5' 3.25" (1.607 m)  Wt 133 lb (60.328 kg)  BMI 23.36 kg/m2   Skin warm and dry.Pelvic: external genitalia is normal in appearance for age, vagina:atrophic with loss of moisture,color and rugae, cervix:smooth and atrophic,pap with HPV performed, uterus: normal size, shape and contour, non tender, no masses felt, adnexa: no masses or tenderness noted.   Assessment:    Pap smear only History of +HPV,    Plan:     Follow up in  1 year for pap   Physical and labs with PCP Call prn gyn problems

## 2013-05-17 NOTE — Patient Instructions (Signed)
Follow up in 1 year for pap

## 2013-05-24 ENCOUNTER — Telehealth: Payer: Self-pay | Admitting: Adult Health

## 2013-05-24 NOTE — Telephone Encounter (Signed)
Pt aware pap negative but +HPV repeat pap in 1 year

## 2013-11-20 ENCOUNTER — Encounter: Payer: Self-pay | Admitting: Adult Health

## 2013-11-27 ENCOUNTER — Other Ambulatory Visit (HOSPITAL_COMMUNITY): Payer: Self-pay | Admitting: Family Medicine

## 2013-11-27 ENCOUNTER — Ambulatory Visit (HOSPITAL_COMMUNITY)
Admission: RE | Admit: 2013-11-27 | Discharge: 2013-11-27 | Disposition: A | Discharge status: Home / Self Care | Payer: Medicare HMO | Source: Ambulatory Visit | Attending: Family Medicine | Admitting: Family Medicine

## 2013-11-27 DIAGNOSIS — R0781 Pleurodynia: Secondary | ICD-10-CM | POA: Diagnosis not present

## 2014-01-29 DIAGNOSIS — H2511 Age-related nuclear cataract, right eye: Secondary | ICD-10-CM | POA: Diagnosis not present

## 2014-01-29 DIAGNOSIS — H25811 Combined forms of age-related cataract, right eye: Secondary | ICD-10-CM | POA: Diagnosis not present

## 2014-01-29 DIAGNOSIS — Z961 Presence of intraocular lens: Secondary | ICD-10-CM | POA: Diagnosis not present

## 2014-02-06 DIAGNOSIS — H25811 Combined forms of age-related cataract, right eye: Secondary | ICD-10-CM | POA: Diagnosis not present

## 2014-02-06 DIAGNOSIS — Z961 Presence of intraocular lens: Secondary | ICD-10-CM | POA: Diagnosis not present

## 2014-03-06 ENCOUNTER — Other Ambulatory Visit: Payer: Self-pay

## 2014-03-06 DIAGNOSIS — Z1231 Encounter for screening mammogram for malignant neoplasm of breast: Secondary | ICD-10-CM

## 2014-04-16 ENCOUNTER — Ambulatory Visit
Admission: RE | Admit: 2014-04-16 | Discharge: 2014-04-16 | Disposition: A | Discharge status: Home / Self Care | Payer: Medicare HMO | Source: Ambulatory Visit

## 2014-04-16 DIAGNOSIS — Z1231 Encounter for screening mammogram for malignant neoplasm of breast: Secondary | ICD-10-CM

## 2014-04-30 DIAGNOSIS — E063 Autoimmune thyroiditis: Secondary | ICD-10-CM | POA: Diagnosis not present

## 2014-04-30 DIAGNOSIS — E782 Mixed hyperlipidemia: Secondary | ICD-10-CM | POA: Diagnosis not present

## 2014-04-30 DIAGNOSIS — Z6821 Body mass index (BMI) 21.0-21.9, adult: Secondary | ICD-10-CM | POA: Diagnosis not present

## 2014-04-30 DIAGNOSIS — I10 Essential (primary) hypertension: Secondary | ICD-10-CM | POA: Diagnosis not present

## 2014-04-30 DIAGNOSIS — F419 Anxiety disorder, unspecified: Secondary | ICD-10-CM | POA: Diagnosis not present

## 2014-05-21 DIAGNOSIS — N342 Other urethritis: Secondary | ICD-10-CM | POA: Diagnosis not present

## 2014-05-21 DIAGNOSIS — Z6822 Body mass index (BMI) 22.0-22.9, adult: Secondary | ICD-10-CM | POA: Diagnosis not present

## 2014-06-05 DIAGNOSIS — E039 Hypothyroidism, unspecified: Secondary | ICD-10-CM | POA: Diagnosis not present

## 2014-06-05 DIAGNOSIS — D649 Anemia, unspecified: Secondary | ICD-10-CM | POA: Diagnosis not present

## 2014-06-05 DIAGNOSIS — Z6822 Body mass index (BMI) 22.0-22.9, adult: Secondary | ICD-10-CM | POA: Diagnosis not present

## 2014-06-05 DIAGNOSIS — N342 Other urethritis: Secondary | ICD-10-CM | POA: Diagnosis not present

## 2014-06-12 ENCOUNTER — Other Ambulatory Visit (HOSPITAL_COMMUNITY)
Admission: RE | Admit: 2014-06-12 | Discharge: 2014-06-12 | Disposition: A | Discharge status: Home / Self Care | Payer: Commercial Managed Care - HMO | Source: Ambulatory Visit | Attending: Obstetrics and Gynecology | Admitting: Obstetrics and Gynecology

## 2014-06-12 ENCOUNTER — Ambulatory Visit (INDEPENDENT_AMBULATORY_CARE_PROVIDER_SITE_OTHER): Payer: Commercial Managed Care - HMO | Admitting: Adult Health

## 2014-06-12 ENCOUNTER — Encounter: Payer: Self-pay | Admitting: Adult Health

## 2014-06-12 VITALS — BP 140/78 | HR 68 | Ht 62.0 in | Wt 126.0 lb

## 2014-06-12 DIAGNOSIS — Z1151 Encounter for screening for human papillomavirus (HPV): Secondary | ICD-10-CM | POA: Insufficient documentation

## 2014-06-12 DIAGNOSIS — Z1212 Encounter for screening for malignant neoplasm of rectum: Secondary | ICD-10-CM

## 2014-06-12 DIAGNOSIS — IMO0002 Reserved for concepts with insufficient information to code with codable children: Secondary | ICD-10-CM

## 2014-06-12 DIAGNOSIS — Z124 Encounter for screening for malignant neoplasm of cervix: Secondary | ICD-10-CM | POA: Diagnosis not present

## 2014-06-12 DIAGNOSIS — Z139 Encounter for screening, unspecified: Secondary | ICD-10-CM

## 2014-06-12 DIAGNOSIS — R87810 Cervical high risk human papillomavirus (HPV) DNA test positive: Secondary | ICD-10-CM | POA: Insufficient documentation

## 2014-06-12 LAB — HEMOCCULT GUIAC POC 1CARD (OFFICE): Fecal Occult Blood, POC: NEGATIVE

## 2014-06-12 NOTE — Progress Notes (Signed)
Subjective:     Patient ID: Julie Farrell, female   DOB: 03-01-1945, 69 y.o.   MRN: [id number]  HPI Julie Farrell is a 69 year old white female in for pap, her last one was 05/17/13 and it was normal but had +HPV.She [facility] physical [facility]  Review of Systems Patient denies any headaches, hearing loss, fatigue, blurred vision, shortness of breath, chest pain, abdominal pain, problems with bowel movements(uses metamucil), urination, or intercourse. No joint pain or mood swings. Reviewed past medical,surgical, social and family history. Reviewed medications and allergies.     Objective:   Physical Exam BP 140/78 mmHg  Pulse 68  Ht 5\' 2"  (1.575 m)  Wt 126 lb (57.153 kg)  BMI 23.04 kg/m2   Skin warm and dry.Pelvic: external genitalia is normal in appearance no lesions, vagina: has decreased color, moisture and rugae,urethra has no lesions or masses noted, cervix:smooth and atrophic and stenotic at os, pap with HPV performed, uterus: normal size, shape and contour, non tender, no masses felt, adnexa: no masses or tenderness noted. Bladder is non tender and no masses felt, On rectal exam has good sphincter tone, no polyps or hemorrhoids felt and hemoccult was negative.  Assessment:     Pap smear History of +HPV    Plan:    OK to use miralax  Pap in 2-3 years if normal Physical and labs with PCP   mammogram yearly Colonoscopy as per GI

## 2014-06-12 NOTE — Patient Instructions (Signed)
Pap in 2 years

## 2014-06-15 LAB — CYTOLOGY - PAP

## 2014-06-19 ENCOUNTER — Telehealth: Payer: Self-pay | Admitting: Adult Health

## 2014-06-19 DIAGNOSIS — N390 Urinary tract infection, site not specified: Secondary | ICD-10-CM | POA: Diagnosis not present

## 2014-06-19 DIAGNOSIS — R319 Hematuria, unspecified: Secondary | ICD-10-CM | POA: Diagnosis not present

## 2014-06-19 NOTE — Telephone Encounter (Signed)
Pt aware of pap and +HPV will repeat pain in 1 year

## 2014-07-17 DIAGNOSIS — E063 Autoimmune thyroiditis: Secondary | ICD-10-CM | POA: Diagnosis not present

## 2014-10-10 DIAGNOSIS — Z1389 Encounter for screening for other disorder: Secondary | ICD-10-CM | POA: Diagnosis not present

## 2014-10-10 DIAGNOSIS — F419 Anxiety disorder, unspecified: Secondary | ICD-10-CM | POA: Diagnosis not present

## 2014-10-10 DIAGNOSIS — Z6821 Body mass index (BMI) 21.0-21.9, adult: Secondary | ICD-10-CM | POA: Diagnosis not present

## 2014-10-10 DIAGNOSIS — M546 Pain in thoracic spine: Secondary | ICD-10-CM | POA: Diagnosis not present

## 2014-11-01 DIAGNOSIS — Z23 Encounter for immunization: Secondary | ICD-10-CM | POA: Diagnosis not present

## 2014-12-05 DIAGNOSIS — R5383 Other fatigue: Secondary | ICD-10-CM | POA: Diagnosis not present

## 2014-12-05 DIAGNOSIS — R634 Abnormal weight loss: Secondary | ICD-10-CM | POA: Diagnosis not present

## 2014-12-05 DIAGNOSIS — Z79899 Other long term (current) drug therapy: Secondary | ICD-10-CM | POA: Diagnosis not present

## 2014-12-05 DIAGNOSIS — E063 Autoimmune thyroiditis: Secondary | ICD-10-CM | POA: Diagnosis not present

## 2014-12-05 DIAGNOSIS — Z1389 Encounter for screening for other disorder: Secondary | ICD-10-CM | POA: Diagnosis not present

## 2014-12-05 DIAGNOSIS — Z6820 Body mass index (BMI) 20.0-20.9, adult: Secondary | ICD-10-CM | POA: Diagnosis not present

## 2014-12-25 DIAGNOSIS — Z6820 Body mass index (BMI) 20.0-20.9, adult: Secondary | ICD-10-CM | POA: Diagnosis not present

## 2014-12-25 DIAGNOSIS — Z1389 Encounter for screening for other disorder: Secondary | ICD-10-CM | POA: Diagnosis not present

## 2014-12-25 DIAGNOSIS — N3281 Overactive bladder: Secondary | ICD-10-CM | POA: Diagnosis not present

## 2014-12-25 DIAGNOSIS — R3129 Other microscopic hematuria: Secondary | ICD-10-CM | POA: Diagnosis not present

## 2014-12-25 DIAGNOSIS — F419 Anxiety disorder, unspecified: Secondary | ICD-10-CM | POA: Diagnosis not present

## 2014-12-25 DIAGNOSIS — E063 Autoimmune thyroiditis: Secondary | ICD-10-CM | POA: Diagnosis not present

## 2014-12-25 DIAGNOSIS — I10 Essential (primary) hypertension: Secondary | ICD-10-CM | POA: Diagnosis not present

## 2015-01-01 DIAGNOSIS — R8290 Unspecified abnormal findings in urine: Secondary | ICD-10-CM | POA: Diagnosis not present

## 2015-01-07 DIAGNOSIS — I10 Essential (primary) hypertension: Secondary | ICD-10-CM | POA: Diagnosis not present

## 2015-01-07 DIAGNOSIS — E063 Autoimmune thyroiditis: Secondary | ICD-10-CM | POA: Diagnosis not present

## 2015-01-07 DIAGNOSIS — N390 Urinary tract infection, site not specified: Secondary | ICD-10-CM | POA: Diagnosis not present

## 2015-01-07 DIAGNOSIS — E782 Mixed hyperlipidemia: Secondary | ICD-10-CM | POA: Diagnosis not present

## 2015-01-07 DIAGNOSIS — Z6820 Body mass index (BMI) 20.0-20.9, adult: Secondary | ICD-10-CM | POA: Diagnosis not present

## 2015-02-17 ENCOUNTER — Emergency Department (HOSPITAL_COMMUNITY): Payer: Commercial Managed Care - HMO

## 2015-02-17 ENCOUNTER — Emergency Department (HOSPITAL_COMMUNITY)
Admission: EM | Admit: 2015-02-17 | Discharge: 2015-02-17 | Disposition: A | Discharge status: Home / Self Care | Payer: Commercial Managed Care - HMO | Attending: Emergency Medicine | Admitting: Emergency Medicine

## 2015-02-17 ENCOUNTER — Encounter (HOSPITAL_COMMUNITY): Payer: Self-pay

## 2015-02-17 DIAGNOSIS — Z79899 Other long term (current) drug therapy: Secondary | ICD-10-CM | POA: Insufficient documentation

## 2015-02-17 DIAGNOSIS — Z8619 Personal history of other infectious and parasitic diseases: Secondary | ICD-10-CM | POA: Diagnosis not present

## 2015-02-17 DIAGNOSIS — M549 Dorsalgia, unspecified: Secondary | ICD-10-CM | POA: Diagnosis not present

## 2015-02-17 DIAGNOSIS — E079 Disorder of thyroid, unspecified: Secondary | ICD-10-CM | POA: Insufficient documentation

## 2015-02-17 DIAGNOSIS — R0602 Shortness of breath: Secondary | ICD-10-CM | POA: Diagnosis not present

## 2015-02-17 DIAGNOSIS — R1013 Epigastric pain: Secondary | ICD-10-CM | POA: Insufficient documentation

## 2015-02-17 DIAGNOSIS — R079 Chest pain, unspecified: Secondary | ICD-10-CM | POA: Diagnosis not present

## 2015-02-17 DIAGNOSIS — R109 Unspecified abdominal pain: Secondary | ICD-10-CM | POA: Diagnosis not present

## 2015-02-17 DIAGNOSIS — Z7982 Long term (current) use of aspirin: Secondary | ICD-10-CM | POA: Diagnosis not present

## 2015-02-17 DIAGNOSIS — R911 Solitary pulmonary nodule: Secondary | ICD-10-CM | POA: Insufficient documentation

## 2015-02-17 DIAGNOSIS — E785 Hyperlipidemia, unspecified: Secondary | ICD-10-CM | POA: Insufficient documentation

## 2015-02-17 DIAGNOSIS — R1012 Left upper quadrant pain: Secondary | ICD-10-CM | POA: Diagnosis not present

## 2015-02-17 DIAGNOSIS — Z8719 Personal history of other diseases of the digestive system: Secondary | ICD-10-CM | POA: Insufficient documentation

## 2015-02-17 LAB — HEPATIC FUNCTION PANEL
ALK PHOS: 62 U/L (ref 38–126)
ALT: 20 U/L (ref 14–54)
AST: 24 U/L (ref 15–41)
Albumin: 4.6 g/dL (ref 3.5–5.0)
BILIRUBIN DIRECT: 0.1 mg/dL (ref 0.1–0.5)
BILIRUBIN INDIRECT: 0.5 mg/dL (ref 0.3–0.9)
Total Bilirubin: 0.6 mg/dL (ref 0.3–1.2)
Total Protein: 7.5 g/dL (ref 6.5–8.1)

## 2015-02-17 LAB — CBC
HEMATOCRIT: 38.8 % (ref 36.0–46.0)
HEMOGLOBIN: 13.3 g/dL (ref 12.0–15.0)
MCH: 31 pg (ref 26.0–34.0)
MCHC: 34.3 g/dL (ref 30.0–36.0)
MCV: 90.4 fL (ref 78.0–100.0)
Platelets: 273 10*3/uL (ref 150–400)
RBC: 4.29 MIL/uL (ref 3.87–5.11)
RDW: 12.3 % (ref 11.5–15.5)
WBC: 6.7 10*3/uL (ref 4.0–10.5)

## 2015-02-17 LAB — BASIC METABOLIC PANEL
ANION GAP: 7 (ref 5–15)
BUN: 20 mg/dL (ref 6–20)
CHLORIDE: 104 mmol/L (ref 101–111)
CO2: 28 mmol/L (ref 22–32)
Calcium: 9.9 mg/dL (ref 8.9–10.3)
Creatinine, Ser: 0.8 mg/dL (ref 0.44–1.00)
GFR calc non Af Amer: >60 mL/min (ref >60)
Glucose, Bld: 98 mg/dL (ref 65–99)
POTASSIUM: 3.6 mmol/L (ref 3.5–5.1)
SODIUM: 139 mmol/L (ref 135–145)

## 2015-02-17 LAB — LIPASE, BLOOD: Lipase: 31 U/L (ref 11–51)

## 2015-02-17 MED ORDER — SODIUM CHLORIDE 0.9 % IV SOLN
INTRAVENOUS | Status: DC
  Administered 2015-02-17: 13:00:00 via INTRAVENOUS

## 2015-02-17 MED ORDER — SODIUM CHLORIDE 0.9 % IV BOLUS (SEPSIS)
500.0000 mL | Freq: Once | INTRAVENOUS | Status: AC
  Administered 2015-02-17: 500 mL via INTRAVENOUS

## 2015-02-17 MED ORDER — FENTANYL CITRATE (PF) 100 MCG/2ML IJ SOLN
50.0000 ug | Freq: Once | INTRAMUSCULAR | Status: DC
  Filled 2015-02-17: qty 2

## 2015-02-17 MED ORDER — DIATRIZOATE MEGLUMINE & SODIUM 66-10 % PO SOLN
ORAL | Status: AC
  Administered 2015-02-17: 30 mL
  Filled 2015-02-17: qty 30

## 2015-02-17 MED ORDER — ONDANSETRON HCL 4 MG/2ML IJ SOLN
4.0000 mg | Freq: Once | INTRAMUSCULAR | Status: DC
  Filled 2015-02-17: qty 2

## 2015-02-17 MED ORDER — IOHEXOL 300 MG/ML  SOLN
100.0000 mL | Freq: Once | INTRAMUSCULAR | Status: AC | PRN
Start: 2015-02-17 — End: 2015-02-17
  Administered 2015-02-17: 100 mL via INTRAVENOUS

## 2015-02-17 MED ORDER — TRAMADOL HCL 50 MG PO TABS
50.0000 mg | ORAL_TABLET | Freq: Four times a day (QID) | ORAL | Status: DC | PRN
Start: 2015-02-17 — End: 2015-07-31

## 2015-02-17 NOTE — Discharge Instructions (Signed)
Workup without any significant findings. Recommend you make an appointment with your regular doctor for further evaluation of the abdominal pain. No evidence of any cardiac process here today. Chest x-ray did show evidence of a pulmonary nodule which will require close follow-up in your regular doctor can do that for you. Take the tramadol as needed for the abdominal pain. Return for any new or worse symptoms.

## 2015-02-17 NOTE — ED Notes (Signed)
Drinking po contrast. Tolerating well. 

## 2015-02-17 NOTE — ED Notes (Addendum)
Troponin i-stat result : 0.00

## 2015-02-17 NOTE — ED Provider Notes (Addendum)
CSN: [id number]     Arrival date & time 02/17/15  1049 History  By signing my name below, I, Gonzella Lex, attest that this documentation has been prepared under the direction and in the presence of Vanetta Mulders, MD. Electronically Signed: Gonzella Lex, Scribe. 02/17/2015. 12:25 PM.   Chief Complaint  Patient presents with  . Chest Pain   The history is provided by the patient and a relative. No language interpreter was used.   HPI Comments: Julie Farrell is a 70 y.o. female who presents to the Emergency Department complaining of gradualy worsening, intermittent, epigastric, LUQ abdominal pain with radiation into her back, which has persisted for the past two months. She states her pain that began today is worse than normal and she also reports associated SOB today. She states that her pain is currently a 5/10 but at its worst it is a 10/10. Pt notes that she mostly experiences this pain while lifting 35-40 lb boxes while at work. Once the pain begins at work, it is constant until she is able to apply a heating pad at home which relieves her pain within an hour. Two months ago pt was prescribed muscle relaxer which she states she does not take. Her oxygen saturation level is currently 100% on RA. Pt denies nasuea, vomiting, fever chills, cough, rhinorrhea, sore throat, diaphoresis, chest pain, leg swelling, nausea, vomiting, diarrhea, dysuria, rash, HA, and hx of bleeding easily.   Past Medical History  Diagnosis Date  . Hyperlipidemia   . Hemorrhoids   . Abnormal pap   . HPV test positive 05/17/2013  . Thyroid disease    Past Surgical History  Procedure Laterality Date  . Fatty tissue excision Left 2008    [facility]-Dr. Hilda Lias  . Colonoscopy  2009    [facility]-Dr. Karilyn Cota  . Right ankle Right   . Cataract extraction w/phaco Left 05/16/2012    Procedure: CATARACT EXTRACTION PHACO AND INTRAOCULAR LENS PLACEMENT (IOC);  Surgeon: Gemma Payor, MD;  Location: [facility];  Service:  Ophthalmology;  Laterality: Left;  CDE:15.20   Family History  Problem Relation Age of Onset  . Heart attack Mother   . Heart disease Mother     CAD  . Heart disease Brother   . Heart disease Brother   . Hypertension Father   . Stroke Father    Social History  Substance Use Topics  . Smoking status: Never Smoker   . Smokeless tobacco: Never Used  . Alcohol Use: No   OB History    Gravida Para Term Preterm AB TAB SAB Ectopic Multiple Living   1 1        1      Review of Systems  Constitutional: Negative for fever and chills.  HENT: Negative for rhinorrhea and sore throat.   Eyes: Negative for visual disturbance.  Respiratory: Positive for shortness of breath. Negative for cough.   Cardiovascular: Negative for chest pain and leg swelling.  Gastrointestinal: Positive for abdominal pain. Negative for nausea, vomiting and diarrhea.  Genitourinary: Negative for dysuria.  Musculoskeletal: Positive for back pain.  Skin: Negative for rash.  Neurological: Negative for headaches.  Hematological: Does not bruise/bleed easily.  Psychiatric/Behavioral: Negative for confusion.  All other systems reviewed and are negative.   Allergies  Review of patient's allergies indicates no known allergies.  Home Medications   Prior to Admission medications   Medication Sig Start Date End Date Taking? Authorizing Provider  amLODipine (NORVASC) 5 MG tablet Take 1 tablet by  mouth daily. 01/26/15  Yes Historical Provider, MD  aspirin EC 81 MG tablet Take 81 mg by mouth daily.   Yes Historical Provider, MD  ibuprofen (ADVIL,MOTRIN) 200 MG tablet Take 600 mg by mouth every 6 (six) hours as needed for moderate pain.   Yes Historical Provider, MD  levothyroxine (SYNTHROID, LEVOTHROID) 50 MCG tablet Take 50 mcg by mouth daily. 06/06/14  Yes Historical Provider, MD  simvastatin (ZOCOR) 20 MG tablet Take 20 mg by mouth at bedtime.   Yes Historical Provider, MD  traMADol (ULTRAM) 50 MG tablet Take 1 tablet  (50 mg total) by mouth every 6 (six) hours as needed. 02/17/15   Vanetta Mulders, MD   BP 124/56 mmHg  Pulse 78  Temp(Src) 98.3 F (36.8 C) (Oral)  Resp 12  Ht 5\' 2"  (1.575 m)  Wt 56.7 kg  BMI 22.86 kg/m2  SpO2 95% Physical Exam  Constitutional: She is oriented to person, place, and time. She appears well-developed and well-nourished. No distress.  HENT:  Head: Normocephalic and atraumatic.  Mouth/Throat: Oropharynx is clear and moist.  Mucous membranes are moist  Eyes: Conjunctivae and EOM are normal. Pupils are equal, round, and reactive to light. No scleral icterus.  Cardiovascular: Normal rate, regular rhythm and normal heart sounds.   Pulmonary/Chest: Effort normal and breath sounds normal.  Lungs clear bilaterally  Abdominal: Soft. Bowel sounds are normal. She exhibits no distension. There is no tenderness.  Non tender to palpation  Musculoskeletal: She exhibits no edema.  No ankle swelling  Neurological: She is alert and oriented to person, place, and time. She has normal reflexes. No cranial nerve deficit. She exhibits normal muscle tone. Coordination normal.  Skin: Skin is warm and dry.  Psychiatric: She has a normal mood and affect.  Nursing note and vitals reviewed.   ED Course  Procedures  DIAGNOSTIC STUDIES:    Oxygen Saturation is 100% on RA, normal by my interpretation.   COORDINATION OF CARE:  11:14 AM Will order CT scan of abdomen. Discussed treatment plan with pt at bedside and pt agreed to plan.   Results for orders placed or performed during the hospital encounter of 02/17/15  Basic metabolic panel  Result Value Ref Range   Sodium 139 135 - 145 mmol/L   Potassium 3.6 3.5 - 5.1 mmol/L   Chloride 104 101 - 111 mmol/L   CO2 28 22 - 32 mmol/L   Glucose, Bld 98 65 - 99 mg/dL   BUN 20 6 - 20 mg/dL   Creatinine, Ser 1.61 0.44 - 1.00 mg/dL   Calcium 9.9 8.9 - 09.6 mg/dL   GFR calc non Af Amer >60 >60 mL/min   GFR calc Af Amer >60 >60 mL/min   Anion  gap 7 5 - 15  CBC  Result Value Ref Range   WBC 6.7 4.0 - 10.5 K/uL   RBC 4.29 3.87 - 5.11 MIL/uL   Hemoglobin 13.3 12.0 - 15.0 g/dL   HCT 04.5 40.9 - 81.1 %   MCV 90.4 78.0 - 100.0 fL   MCH 31.0 26.0 - 34.0 pg   MCHC 34.3 30.0 - 36.0 g/dL   RDW 91.4 78.2 - 95.6 %   Platelets 273 150 - 400 K/uL  Hepatic function panel  Result Value Ref Range   Total Protein 7.5 6.5 - 8.1 g/dL   Albumin 4.6 3.5 - 5.0 g/dL   AST 24 15 - 41 U/L   ALT 20 14 - 54 U/L   Alkaline Phosphatase  62 38 - 126 U/L   Total Bilirubin 0.6 0.3 - 1.2 mg/dL   Bilirubin, Direct 0.1 0.1 - 0.5 mg/dL   Indirect Bilirubin 0.5 0.3 - 0.9 mg/dL  Lipase, blood  Result Value Ref Range   Lipase 31 11 - 51 U/L    Results for orders placed or performed during the hospital encounter of 02/17/15  Basic metabolic panel  Result Value Ref Range   Sodium 139 135 - 145 mmol/L   Potassium 3.6 3.5 - 5.1 mmol/L   Chloride 104 101 - 111 mmol/L   CO2 28 22 - 32 mmol/L   Glucose, Bld 98 65 - 99 mg/dL   BUN 20 6 - 20 mg/dL   Creatinine, Ser 0.45 0.44 - 1.00 mg/dL   Calcium 9.9 8.9 - 40.9 mg/dL   GFR calc non Af Amer >60 >60 mL/min   GFR calc Af Amer >60 >60 mL/min   Anion gap 7 5 - 15  CBC  Result Value Ref Range   WBC 6.7 4.0 - 10.5 K/uL   RBC 4.29 3.87 - 5.11 MIL/uL   Hemoglobin 13.3 12.0 - 15.0 g/dL   HCT 81.1 91.4 - 78.2 %   MCV 90.4 78.0 - 100.0 fL   MCH 31.0 26.0 - 34.0 pg   MCHC 34.3 30.0 - 36.0 g/dL   RDW 95.6 21.3 - 08.6 %   Platelets 273 150 - 400 K/uL  Hepatic function panel  Result Value Ref Range   Total Protein 7.5 6.5 - 8.1 g/dL   Albumin 4.6 3.5 - 5.0 g/dL   AST 24 15 - 41 U/L   ALT 20 14 - 54 U/L   Alkaline Phosphatase 62 38 - 126 U/L   Total Bilirubin 0.6 0.3 - 1.2 mg/dL   Bilirubin, Direct 0.1 0.1 - 0.5 mg/dL   Indirect Bilirubin 0.5 0.3 - 0.9 mg/dL  Lipase, blood  Result Value Ref Range   Lipase 31 11 - 51 U/L      Dg Chest 2 View  02/17/2015  CLINICAL DATA:  Left-sided chest pain started  about a month ago when is now bathroom worse. EXAM: CHEST  2 VIEW COMPARISON:  None. FINDINGS: Lungs are hyperexpanded. The lungs are clear wiithout focal pneumonia, edema, pneumothorax or pleural effusion. Nodular density projecting over the right lung base is probably a nipple shadow. The cardiopericardial silhouette is within normal limits for size. The visualized bony structures of the thorax are intact. Telemetry leads overlie the chest. IMPRESSION: Hyperexpansion suggests emphysema. 10 mm nodule projecting over the right lung base may be a nipple shadow. Repeat frontal radiograph with nipple markers recommended to confirm. Electronically Signed   By: Kennith Center M.D.   On: 02/17/2015 11:38   Ct Abdomen Pelvis W Contrast  02/17/2015  CLINICAL DATA:  Left upper quadrant intermittent abdominal pain tracking in the back over the last 2 months. Shortness of breath. EXAM: CT ABDOMEN AND PELVIS WITH CONTRAST TECHNIQUE: Multidetector CT imaging of the abdomen and pelvis was performed using the standard protocol following bolus administration of intravenous contrast. CONTRAST:  OMNIPAQUE IOHEXOL 300 MG/ML  SOLN COMPARISON:  Chest radiograph 02/17/2015 FINDINGS: Lower chest: Linear subsegmental atelectasis or scarring in the posterior basal segments of both lower lobes. Mild calcification of the visualized portion of the aortic valve. Hepatobiliary: Hypodense 5 mm lesion on image 22 series 2 in the right hepatic lobe, appears to persist as a sharply defined hypodensity on delayed images, likely a cyst, although technically nonspecific  due to small size. Gallbladder unremarkable. Pancreas: Unremarkable Spleen: Unremarkable Adrenals/Urinary Tract: Adrenal glands normal. Bilateral extrarenal pelvis. Punctate calcification near but not within the left distal ureter on image 68 series 2. Mild proximal right hydroureter extending down to the iliac vessel cross over region. Symmetric renal enhancement and excretion.  Urinary bladder mildly distended although this may be incidental. Stomach/Bowel: Prominent stool throughout the colon favors constipation. Prominence of stool in the rectal vault. Appendix normal. Vascular/Lymphatic: Aortoiliac atherosclerotic vascular disease. Reproductive: Unremarkable Other: No supplemental non-categorized findings. Musculoskeletal: Bony demineralization. 6 mm of degenerative anterolisthesis at L4-5 with disc uncovering, facet arthropathy, and right foraminal disc protrusion contributing to right greater than left foraminal stenosis and at least mild central narrowing of the thecal sac. IMPRESSION: 1. Prominent stool throughout the colon favors constipation. Prominence of stool in the rectal vault. 2. Bilateral extrarenal pelvis with mild right hydroureter extending down to the iliac vessel cross over, but no stones or obvious cause for obstruction identified. Symmetric enhancement and excretion of the kidneys argues against a high-grade obstruction. The urinary bladder is mildly distended although this may be incidental. 3. Other imaging findings of potential clinical significance: Bibasilar subsegmental atelectasis. Faintly calcified aortic valve. Aortoiliac atherosclerotic vascular disease. Impingement at L4-5 due to degenerative subluxation, degenerative disc disease, and facet arthropathy. Electronically Signed   By: Gaylyn Rong M.D.   On: 02/17/2015 15:14    Medications  0.9 %  sodium chloride infusion ( Intravenous New Bag/Given 02/17/15 1240)  ondansetron (ZOFRAN) injection 4 mg (4 mg Intravenous Not Given 02/17/15 1211)  fentaNYL (SUBLIMAZE) injection 50 mcg (50 mcg Intravenous Not Given 02/17/15 1211)  sodium chloride 0.9 % bolus 500 mL (0 mLs Intravenous Stopped 02/17/15 1240)  diatrizoate meglumine-sodium (GASTROGRAFIN) 66-10 % solution (30 mLs  Given 02/17/15 1442)  iohexol (OMNIPAQUE) 300 MG/ML solution 100 mL (100 mLs Intravenous Contrast Given 02/17/15 1442)   ED ECG  REPORT   Date: 02/17/2015  Rate: 92  Rhythm: normal sinus rhythm  QRS Axis: left  Intervals: normal  ST/T Wave abnormalities: nonspecific ST/T changes  Conduction Disutrbances:left bundle branch block  Narrative Interpretation:   Old EKG Reviewed: unchanged  I have personally reviewed the EKG tracing and agree with the computerized printout as noted.   MDM   Final diagnoses:  Left upper quadrant pain  Pulmonary nodule    Patient of presenting with a two-month history of intermittent left upper quadrant abdominal pain some epigastric pain. No true chest pain. It only occurs while at work when lifting objects that are about 40 pounds or heavier. She works at [employer]. Today had an episode that occurred started 2 hours prior to arrival. These typically last most of the day when they occur. Today's was associated with some shortness of breath feeling and also more intense pain than usual but otherwise it was exactly the same. Never associated with nausea vomiting or diarrhea. Has not had a workup for this.   Usually goes away with rest at home only to reoccur when having to lift heavy objects at work again.  Workup here negative for any acute cardiac findings.   Chest x-ray shows evidence of a nodule that'll need follow-up chest x-rays. Epigastric and abdominal pain labs no leukocytosis liver functions are normal lipase is normal basic chemistries are normal.  In addition point care troponin documented in the nursing notes is 0.00 showed no elevation.  Disposition will be based on the patient's CT scan which is pending and should be back  shortly. If CT scan does not show any significant abnormalities is most likely musculoskeletal in nature. May think looking for on CAT scan would be a hernia.  I personally performed the services described in this documentation, which was scribed in my presence. The recorded information has been reviewed and is accurate.      Vanetta Mulders,  MD 02/17/15 1508  CT scan without any acute processes. Patient made aware of the pulmonary nodule and close follow-up requirement. Will treat with tramadol have her follow-up with her primary care doctor. Seems to be a musculoskeletal type pain that is exacerbated by lifting. Patient given a work note.  Vanetta Mulders, MD 02/17/15 [phone]

## 2015-02-17 NOTE — ED Notes (Signed)
Pt reports that she has chest pain epigastric area that goes under left breast. Started 2 hrs ago. No N,V,D. Had similar episode on Thursday and it lasted 4 hrs

## 2015-02-17 NOTE — ED Notes (Signed)
Patient ambulatory to restroom with family at side without difficulty.

## 2015-02-18 DIAGNOSIS — R911 Solitary pulmonary nodule: Secondary | ICD-10-CM | POA: Diagnosis not present

## 2015-02-18 DIAGNOSIS — E063 Autoimmune thyroiditis: Secondary | ICD-10-CM | POA: Diagnosis not present

## 2015-02-18 DIAGNOSIS — Z6820 Body mass index (BMI) 20.0-20.9, adult: Secondary | ICD-10-CM | POA: Diagnosis not present

## 2015-02-18 DIAGNOSIS — Z1389 Encounter for screening for other disorder: Secondary | ICD-10-CM | POA: Diagnosis not present

## 2015-02-18 DIAGNOSIS — I10 Essential (primary) hypertension: Secondary | ICD-10-CM | POA: Diagnosis not present

## 2015-02-18 LAB — I-STAT TROPONIN, ED: Troponin i, poc: 0 ng/mL (ref 0.00–0.08)

## 2015-02-19 ENCOUNTER — Other Ambulatory Visit (HOSPITAL_COMMUNITY): Payer: Self-pay | Admitting: Internal Medicine

## 2015-02-19 DIAGNOSIS — I739 Peripheral vascular disease, unspecified: Secondary | ICD-10-CM

## 2015-02-20 ENCOUNTER — Ambulatory Visit (HOSPITAL_COMMUNITY)
Admission: RE | Admit: 2015-02-20 | Discharge: 2015-02-20 | Disposition: A | Discharge status: Home / Self Care | Payer: Commercial Managed Care - HMO | Source: Ambulatory Visit | Attending: Internal Medicine | Admitting: Internal Medicine

## 2015-02-20 ENCOUNTER — Other Ambulatory Visit (HOSPITAL_COMMUNITY): Payer: Self-pay | Admitting: Internal Medicine

## 2015-02-20 DIAGNOSIS — I739 Peripheral vascular disease, unspecified: Secondary | ICD-10-CM

## 2015-02-20 DIAGNOSIS — IMO0002 Reserved for concepts with insufficient information to code with codable children: Secondary | ICD-10-CM

## 2015-02-20 DIAGNOSIS — R918 Other nonspecific abnormal finding of lung field: Secondary | ICD-10-CM | POA: Diagnosis not present

## 2015-02-20 DIAGNOSIS — J439 Emphysema, unspecified: Secondary | ICD-10-CM | POA: Insufficient documentation

## 2015-02-20 DIAGNOSIS — I700 Atherosclerosis of aorta: Secondary | ICD-10-CM | POA: Diagnosis not present

## 2015-03-08 ENCOUNTER — Other Ambulatory Visit: Payer: Self-pay

## 2015-03-08 DIAGNOSIS — Z1231 Encounter for screening mammogram for malignant neoplasm of breast: Secondary | ICD-10-CM

## 2015-03-15 DIAGNOSIS — Z0000 Encounter for general adult medical examination without abnormal findings: Secondary | ICD-10-CM | POA: Diagnosis not present

## 2015-03-15 DIAGNOSIS — N342 Other urethritis: Secondary | ICD-10-CM | POA: Diagnosis not present

## 2015-03-15 DIAGNOSIS — Z0001 Encounter for general adult medical examination with abnormal findings: Secondary | ICD-10-CM | POA: Diagnosis not present

## 2015-03-15 DIAGNOSIS — Z6821 Body mass index (BMI) 21.0-21.9, adult: Secondary | ICD-10-CM | POA: Diagnosis not present

## 2015-03-15 DIAGNOSIS — Z1389 Encounter for screening for other disorder: Secondary | ICD-10-CM | POA: Diagnosis not present

## 2015-04-19 ENCOUNTER — Ambulatory Visit
Admission: RE | Admit: 2015-04-19 | Discharge: 2015-04-19 | Disposition: A | Discharge status: Home / Self Care | Payer: Commercial Managed Care - HMO | Source: Ambulatory Visit

## 2015-04-19 DIAGNOSIS — Z1231 Encounter for screening mammogram for malignant neoplasm of breast: Secondary | ICD-10-CM | POA: Diagnosis not present

## 2015-05-24 DIAGNOSIS — I10 Essential (primary) hypertension: Secondary | ICD-10-CM | POA: Diagnosis not present

## 2015-05-24 DIAGNOSIS — S39012S Strain of muscle, fascia and tendon of lower back, sequela: Secondary | ICD-10-CM | POA: Diagnosis not present

## 2015-05-24 DIAGNOSIS — Z6821 Body mass index (BMI) 21.0-21.9, adult: Secondary | ICD-10-CM | POA: Diagnosis not present

## 2015-05-24 DIAGNOSIS — Z1389 Encounter for screening for other disorder: Secondary | ICD-10-CM | POA: Diagnosis not present

## 2015-07-25 DIAGNOSIS — M47816 Spondylosis without myelopathy or radiculopathy, lumbar region: Secondary | ICD-10-CM | POA: Diagnosis not present

## 2015-07-25 DIAGNOSIS — M545 Low back pain: Secondary | ICD-10-CM | POA: Diagnosis not present

## 2015-07-25 DIAGNOSIS — Z6821 Body mass index (BMI) 21.0-21.9, adult: Secondary | ICD-10-CM | POA: Diagnosis not present

## 2015-07-25 DIAGNOSIS — E063 Autoimmune thyroiditis: Secondary | ICD-10-CM | POA: Diagnosis not present

## 2015-07-25 DIAGNOSIS — E782 Mixed hyperlipidemia: Secondary | ICD-10-CM | POA: Diagnosis not present

## 2015-07-25 DIAGNOSIS — I10 Essential (primary) hypertension: Secondary | ICD-10-CM | POA: Diagnosis not present

## 2015-07-31 ENCOUNTER — Encounter: Payer: Self-pay | Admitting: Adult Health

## 2015-07-31 ENCOUNTER — Ambulatory Visit (INDEPENDENT_AMBULATORY_CARE_PROVIDER_SITE_OTHER): Payer: Commercial Managed Care - HMO | Admitting: Adult Health

## 2015-07-31 ENCOUNTER — Other Ambulatory Visit (HOSPITAL_COMMUNITY)
Admission: RE | Admit: 2015-07-31 | Discharge: 2015-07-31 | Disposition: A | Discharge status: Home / Self Care | Payer: Commercial Managed Care - HMO | Source: Ambulatory Visit | Attending: Adult Health | Admitting: Adult Health

## 2015-07-31 VITALS — BP 132/80 | HR 78 | Ht 62.0 in | Wt 118.0 lb

## 2015-07-31 DIAGNOSIS — Z139 Encounter for screening, unspecified: Secondary | ICD-10-CM

## 2015-07-31 DIAGNOSIS — Z1151 Encounter for screening for human papillomavirus (HPV): Secondary | ICD-10-CM | POA: Insufficient documentation

## 2015-07-31 DIAGNOSIS — Z124 Encounter for screening for malignant neoplasm of cervix: Secondary | ICD-10-CM | POA: Diagnosis not present

## 2015-07-31 DIAGNOSIS — Z1211 Encounter for screening for malignant neoplasm of colon: Secondary | ICD-10-CM | POA: Diagnosis not present

## 2015-07-31 DIAGNOSIS — IMO0002 Reserved for concepts with insufficient information to code with codable children: Secondary | ICD-10-CM

## 2015-07-31 DIAGNOSIS — Z01419 Encounter for gynecological examination (general) (routine) without abnormal findings: Secondary | ICD-10-CM | POA: Diagnosis not present

## 2015-07-31 DIAGNOSIS — M545 Low back pain: Secondary | ICD-10-CM

## 2015-07-31 LAB — HEMOCCULT GUIAC POC 1CARD (OFFICE): FECAL OCCULT BLD: NEGATIVE

## 2015-07-31 NOTE — Progress Notes (Signed)
Subjective:     Patient ID: Julie Farrell, female   DOB: [birth year], 70 y.o.   MRN: [id number]  HPI Julie Farrell is a 70 year old white female in for pap and pelvic.Her last pap was 06/12/14 and it was normal but had +HPV. She is still working about 28 hours a week at [employer], but her back is bothering her now and she is thinking of cutting back her hours. PCP is Dr Sherwood GamblerFusco.   Review of Systems Patient denies any headaches, hearing loss, fatigue, blurred vision, shortness of breath, chest pain, abdominal pain, problems with bowel movements, urination, or intercourse(not having sex). No joint pain or mood swings.Has pain in back, and it affects left foot, and has seen Dr Lovell SheehanJenkins in [place].  Reviewed past medical,surgical, social and family history. Reviewed medications and allergies.     Objective:   Physical Exam BP 132/80 mmHg  Pulse 78  Ht 5\' 2"  (1.575 m)  Wt 118 lb (53.524 kg)  BMI 21.58 kg/m2   Skin warm and dry.Pelvic: external genitalia is normal in appearance no lesions, vagina;pale,thin and dry, and introitus is tiny,used pediatric speculum,urethra has no lesions or masses noted, cervix:atrophic and stenotic, uterus: normal size, shape and contour, non tender, no masses felt, adnexa: no masses or tenderness noted. Bladder is non tender and no masses felt. On rectal exam has good tone, no polyps or hemorrhoids felt, and hemoccult negative.Abdomen is soft and non tender. Face time 15 minutes.  Assessment:       Screening for malignant neoplasm of cervix Hx +HPV on pap    Plan:      Pap with HPV sent Pap in 3 years if normal Follow up prn

## 2015-07-31 NOTE — Patient Instructions (Signed)
Pap in 3 years if normal  

## 2015-08-01 LAB — CYTOLOGY - PAP

## 2015-09-02 ENCOUNTER — Ambulatory Visit (HOSPITAL_COMMUNITY)
Admission: RE | Admit: 2015-09-02 | Discharge: 2015-09-02 | Disposition: A | Discharge status: Home / Self Care | Payer: Commercial Managed Care - HMO | Source: Ambulatory Visit | Attending: Internal Medicine | Admitting: Internal Medicine

## 2015-09-02 ENCOUNTER — Other Ambulatory Visit (HOSPITAL_COMMUNITY): Payer: Self-pay | Admitting: Internal Medicine

## 2015-09-02 DIAGNOSIS — M549 Dorsalgia, unspecified: Secondary | ICD-10-CM | POA: Insufficient documentation

## 2015-09-02 DIAGNOSIS — M5440 Lumbago with sciatica, unspecified side: Secondary | ICD-10-CM

## 2015-09-02 DIAGNOSIS — E782 Mixed hyperlipidemia: Secondary | ICD-10-CM | POA: Diagnosis not present

## 2015-09-02 DIAGNOSIS — I10 Essential (primary) hypertension: Secondary | ICD-10-CM | POA: Diagnosis not present

## 2015-09-02 DIAGNOSIS — M546 Pain in thoracic spine: Secondary | ICD-10-CM | POA: Diagnosis not present

## 2015-09-02 DIAGNOSIS — Z6821 Body mass index (BMI) 21.0-21.9, adult: Secondary | ICD-10-CM | POA: Diagnosis not present

## 2015-09-06 ENCOUNTER — Encounter (HOSPITAL_COMMUNITY): Payer: Self-pay

## 2015-09-06 ENCOUNTER — Emergency Department (HOSPITAL_COMMUNITY)
Admission: EM | Admit: 2015-09-06 | Discharge: 2015-09-06 | Disposition: A | Discharge status: Home / Self Care | Payer: Commercial Managed Care - HMO | Attending: Emergency Medicine | Admitting: Emergency Medicine

## 2015-09-06 DIAGNOSIS — Z79899 Other long term (current) drug therapy: Secondary | ICD-10-CM | POA: Diagnosis not present

## 2015-09-06 DIAGNOSIS — M7061 Trochanteric bursitis, right hip: Secondary | ICD-10-CM | POA: Diagnosis not present

## 2015-09-06 DIAGNOSIS — Y939 Activity, unspecified: Secondary | ICD-10-CM | POA: Diagnosis not present

## 2015-09-06 DIAGNOSIS — Z7982 Long term (current) use of aspirin: Secondary | ICD-10-CM | POA: Diagnosis not present

## 2015-09-06 DIAGNOSIS — M25551 Pain in right hip: Secondary | ICD-10-CM | POA: Diagnosis present

## 2015-09-06 MED ORDER — NAPROXEN 250 MG PO TABS: ORAL_TABLET | ORAL | 0 refills | Status: DC

## 2015-09-06 MED ORDER — DEXAMETHASONE SODIUM PHOSPHATE 10 MG/ML IJ SOLN
10.0000 mg | Freq: Once | INTRAMUSCULAR | Status: AC
  Administered 2015-09-06: 10 mg via INTRAMUSCULAR
  Filled 2015-09-06: qty 1

## 2015-09-06 NOTE — ED Notes (Signed)
Pt alert & oriented x4, stable gait. Patient given discharge instructions, paperwork & prescription(s). Patient verbalized understanding. Pt left department in wheelchair escorted by staff. Pt left w/ no further questions. 

## 2015-09-06 NOTE — ED Provider Notes (Signed)
[facility] Provider Note   CSN: [id number] Arrival date & time: 09/06/15  0524     History   Chief Complaint Chief Complaint  Patient presents with  . Hip Pain   Time Seen 05:40 AM  HPI Julie Farrell is a 70 y.o. female.  HPI patient reports about 3 days ago she started getting pain on the outer aspect of her right hip that goes down to her knee. She states the pain is sharp and comes and goes and when it comes it last about 30 minutes. She denies any known injury. She states she had something similar a year ago and was diagnosed with sciatica. She denies any back pain today. Took 1 tramadol and hour ago without relief. She denies diabetes.  PCP Dr Sherwood Gambler   Past Medical History:  Diagnosis Date  . Abnormal pap   . Hemorrhoids   . HPV test positive 05/17/2013  . Hyperlipidemia   . Thyroid disease     Patient Active Problem List   Diagnosis Date Noted  . HPV test positive 05/17/2013  . Cataract 05/11/2012    Past Surgical History:  Procedure Laterality Date  . CATARACT EXTRACTION W/PHACO Left 05/16/2012   Procedure: CATARACT EXTRACTION PHACO AND INTRAOCULAR LENS PLACEMENT (IOC);  Surgeon: Gemma Payor, MD;  Location: [facility];  Service: Ophthalmology;  Laterality: Left;  CDE:15.20  . COLONOSCOPY  2009   [facility]-Dr. Karilyn Cota  . Fatty Tissue Excision Left 2008   [facility]-Dr. Hilda Lias  . right ankle Right     OB History    Gravida Para Term Preterm AB Living   1 1       1    SAB TAB Ectopic Multiple Live Births                   Home Medications    Prior to Admission medications   Medication Sig Start Date End Date Taking? Authorizing Provider  amLODipine (NORVASC) 5 MG tablet Take 1 tablet by mouth daily. 01/26/15  Yes Historical Provider, MD  aspirin EC 81 MG tablet Take 81 mg by mouth daily.   Yes Historical Provider, MD  Aspirin-Acetaminophen-Caffeine (GOODY HEADACHE PO) Take by mouth as needed.   Yes Historical Provider, MD  ibuprofen (ADVIL,MOTRIN) 200 MG tablet  Take 600 mg by mouth every 6 (six) hours as needed for moderate pain.   Yes Historical Provider, MD  levothyroxine (SYNTHROID, LEVOTHROID) 50 MCG tablet Take 50 mcg by mouth daily. 06/06/14  Yes Historical Provider, MD  simvastatin (ZOCOR) 20 MG tablet Take 20 mg by mouth at bedtime.   Yes Historical Provider, MD  traMADol (ULTRAM) 50 MG tablet Take by mouth every 6 (six) hours as needed.   Yes Historical Provider, MD  naproxen (NAPROSYN) 250 MG tablet Take 1 po BID with food prn pain 09/06/15   Devoria Albe, MD    Family History Family History  Problem Relation Age of Onset  . Heart attack Mother   . Heart disease Mother     CAD  . Heart disease Brother   . Heart disease Brother   . Hypertension Father   . Stroke Father     Social History Social History  Substance Use Topics  . Smoking status: Never Smoker  . Smokeless tobacco: Never Used  . Alcohol use No  employed   Allergies   Review of patient's allergies indicates no known allergies.   Review of Systems Review of Systems  All other systems reviewed and are negative.  Physical Exam Updated Vital Signs BP 155/63 (BP Location: Left Arm)   Pulse 95   Temp 98.6 F (37 C) (Oral)   Resp 18   Ht 5\' 2"  (1.575 m)   Wt 120 lb (54.4 kg)   SpO2 100%   BMI 21.95 kg/m   Vital signs normal except hypertension   Physical Exam  Constitutional: She is oriented to person, place, and time. She appears well-developed and well-nourished.  Non-toxic appearance. She does not appear ill. No distress.  Speaks softly, very hard to understand  HENT:  Head: Normocephalic and atraumatic.  Right Ear: External ear normal.  Left Ear: External ear normal.  Nose: Nose normal. No mucosal edema or rhinorrhea.  Mouth/Throat: Oropharynx is clear and moist and mucous membranes are normal. No dental abscesses or uvula swelling.  Eyes: Conjunctivae and EOM are normal. Pupils are equal, round, and reactive to light.  Neck: Normal range of  motion and full passive range of motion without pain. Neck supple.  Cardiovascular: Normal rate, regular rhythm and normal heart sounds.  Exam reveals no gallop and no friction rub.   No murmur heard. Pulmonary/Chest: Effort normal and breath sounds normal. No respiratory distress. She has no wheezes. She has no rhonchi. She has no rales. She exhibits no tenderness and no crepitus.  Abdominal: Normal appearance.  Musculoskeletal: Normal range of motion. She exhibits no edema or tenderness.       Legs: Moves all extremities well wiithout pain. Patient's whole spine is nontender to palpation, her SI joints are nontender bilaterally. She has some  tenderness to palpation over the greater trochanter of the right hip. This reproduces her complaints of pain.  Neurological: She is alert and oriented to person, place, and time. She has normal strength. No cranial nerve deficit.  Skin: Skin is warm, dry and intact. No rash noted. No erythema. No pallor.  Psychiatric: She has a normal mood and affect. Her behavior is normal. Her mood appears not anxious.  Nursing note and vitals reviewed.   Right Hip Exam   Tenderness  The patient is experiencing tenderness in the greater trochanter.  Range of Motion  Extension: normal  Flexion: normal   Other  Erythema: absent Pulse: present      ED Treatments / Results   Procedures Procedures (including critical care time)  Medications Ordered in ED Medications  dexamethasone (DECADRON) injection 10 mg (not administered)     Initial Impression / Assessment and Plan / ED Course  I have reviewed the triage vital signs and the nursing notes.  Pertinent labs & imaging results that were available during my care of the patient were reviewed by me and considered in my medical decision making (see chart for details).  Clinical Course   Patient was given Decadron IM for her bursitis. Her BUN and creatinine in January were 20 and 0.8 respectively. She  was discharged home on naproxen. She has a bottle of tramadol she can take at home. She is seeing Dr. Hilda LiasKeeling in the past, she was referred back to him if her pain is not improving.  Review of the [place] database shows patient got #30 tramadol on May 5 from her PCP, she still has almost a full bottle.   Final Clinical Impressions(s) / ED Diagnoses   Final diagnoses:  Trochanteric bursitis of right hip    New Prescriptions New Prescriptions   NAPROXEN (NAPROSYN) 250 MG TABLET    Take 1 po BID with food prn pain  Plan discharge  Devoria AlbeIva Edie Darley, MD, Concha PyoFACEP    Waymond Meador, MD 09/06/15 [phone]

## 2015-09-06 NOTE — ED Triage Notes (Signed)
Pt reports pain to right hip, states it is her "sciatic"   States she has had this pain before.  Pt denies injury or trauma

## 2015-09-06 NOTE — ED Notes (Signed)
Pain in right hip for the past few days, worse this morning when up moving. Pt says she took a pain pill before coming to the ER.

## 2015-09-06 NOTE — Discharge Instructions (Signed)
Use ice and heat on your hip for comfort. Take the naproxen for pain. Recheck with either your doctor or Dr Hilda LiasKeeling if you continue to have pain in your right hip.

## 2015-09-09 ENCOUNTER — Emergency Department (HOSPITAL_COMMUNITY)
Admission: EM | Admit: 2015-09-09 | Discharge: 2015-09-09 | Disposition: A | Discharge status: Home / Self Care | Payer: Commercial Managed Care - HMO | Attending: Emergency Medicine | Admitting: Emergency Medicine

## 2015-09-09 ENCOUNTER — Emergency Department (HOSPITAL_COMMUNITY)
Admission: EM | Admit: 2015-09-09 | Discharge: 2015-09-09 | Disposition: A | Discharge status: Home / Self Care | Payer: Commercial Managed Care - HMO | Source: Home / Self Care | Attending: Emergency Medicine | Admitting: Emergency Medicine

## 2015-09-09 ENCOUNTER — Encounter (HOSPITAL_COMMUNITY): Payer: Self-pay | Admitting: *Deleted

## 2015-09-09 ENCOUNTER — Encounter (HOSPITAL_COMMUNITY): Payer: Self-pay | Admitting: Emergency Medicine

## 2015-09-09 ENCOUNTER — Emergency Department (HOSPITAL_COMMUNITY): Payer: Commercial Managed Care - HMO

## 2015-09-09 DIAGNOSIS — M25559 Pain in unspecified hip: Secondary | ICD-10-CM

## 2015-09-09 DIAGNOSIS — R109 Unspecified abdominal pain: Secondary | ICD-10-CM | POA: Insufficient documentation

## 2015-09-09 DIAGNOSIS — M7061 Trochanteric bursitis, right hip: Secondary | ICD-10-CM | POA: Insufficient documentation

## 2015-09-09 DIAGNOSIS — Z6821 Body mass index (BMI) 21.0-21.9, adult: Secondary | ICD-10-CM | POA: Diagnosis not present

## 2015-09-09 DIAGNOSIS — Z7982 Long term (current) use of aspirin: Secondary | ICD-10-CM | POA: Insufficient documentation

## 2015-09-09 DIAGNOSIS — Z79899 Other long term (current) drug therapy: Secondary | ICD-10-CM

## 2015-09-09 DIAGNOSIS — M25551 Pain in right hip: Secondary | ICD-10-CM | POA: Diagnosis not present

## 2015-09-09 DIAGNOSIS — M79604 Pain in right leg: Secondary | ICD-10-CM | POA: Diagnosis not present

## 2015-09-09 DIAGNOSIS — E860 Dehydration: Secondary | ICD-10-CM | POA: Diagnosis not present

## 2015-09-09 DIAGNOSIS — M5136 Other intervertebral disc degeneration, lumbar region: Secondary | ICD-10-CM | POA: Diagnosis not present

## 2015-09-09 DIAGNOSIS — Y939 Activity, unspecified: Secondary | ICD-10-CM | POA: Insufficient documentation

## 2015-09-09 HISTORY — DX: Other intervertebral disc degeneration, lumbar region without mention of lumbar back pain or lower extremity pain: M51.369

## 2015-09-09 HISTORY — DX: Other intervertebral disc degeneration, lumbar region: M51.36

## 2015-09-09 LAB — BASIC METABOLIC PANEL
ANION GAP: 7 (ref 5–15)
BUN: 28 mg/dL — ABNORMAL HIGH (ref 6–20)
CALCIUM: 9.8 mg/dL (ref 8.9–10.3)
CO2: 27 mmol/L (ref 22–32)
Chloride: 102 mmol/L (ref 101–111)
Creatinine, Ser: 0.98 mg/dL (ref 0.44–1.00)
GFR calc Af Amer: >60 mL/min (ref >60)
GFR, EST NON AFRICAN AMERICAN: 58 mL/min — AB (ref >60)
GLUCOSE: 146 mg/dL — AB (ref 65–99)
POTASSIUM: 4 mmol/L (ref 3.5–5.1)
SODIUM: 136 mmol/L (ref 135–145)

## 2015-09-09 LAB — CBC WITH DIFFERENTIAL/PLATELET
BASOS ABS: 0 10*3/uL (ref 0.0–0.1)
BASOS PCT: 0 %
EOS PCT: 0 %
Eosinophils Absolute: 0 10*3/uL (ref 0.0–0.7)
HEMATOCRIT: 40.4 % (ref 36.0–46.0)
Hemoglobin: 13.8 g/dL (ref 12.0–15.0)
LYMPHS PCT: 4 %
Lymphs Abs: 0.3 10*3/uL — ABNORMAL LOW (ref 0.7–4.0)
MCH: 30.7 pg (ref 26.0–34.0)
MCHC: 34.2 g/dL (ref 30.0–36.0)
MCV: 89.8 fL (ref 78.0–100.0)
MONO ABS: 0 10*3/uL — AB (ref 0.1–1.0)
Monocytes Relative: 1 %
NEUTROS ABS: 5.9 10*3/uL (ref 1.7–7.7)
Neutrophils Relative %: 95 %
Platelets: 284 10*3/uL (ref 150–400)
RBC: 4.5 MIL/uL (ref 3.87–5.11)
RDW: 12.3 % (ref 11.5–15.5)
WBC: 6.2 10*3/uL (ref 4.0–10.5)

## 2015-09-09 LAB — URINE MICROSCOPIC-ADD ON
SQUAMOUS EPITHELIAL / LPF: NONE SEEN
WBC, UA: NONE SEEN WBC/hpf (ref 0–5)

## 2015-09-09 LAB — URINALYSIS, ROUTINE W REFLEX MICROSCOPIC
Bilirubin Urine: NEGATIVE
GLUCOSE, UA: NEGATIVE mg/dL
KETONES UR: 15 mg/dL — AB
Leukocytes, UA: NEGATIVE
Nitrite: NEGATIVE
PROTEIN: NEGATIVE mg/dL
Specific Gravity, Urine: 1.02 (ref 1.005–1.030)
pH: 5.5 (ref 5.0–8.0)

## 2015-09-09 MED ORDER — PREDNISONE 20 MG PO TABS: ORAL_TABLET | ORAL | 0 refills | Status: DC

## 2015-09-09 MED ORDER — OXYCODONE-ACETAMINOPHEN 5-325 MG PO TABS
2.0000 | ORAL_TABLET | Freq: Once | ORAL | Status: AC
  Administered 2015-09-09: 2 via ORAL
  Filled 2015-09-09: qty 2

## 2015-09-09 MED ORDER — FENTANYL CITRATE (PF) 100 MCG/2ML IJ SOLN
50.0000 ug | Freq: Once | INTRAMUSCULAR | Status: AC
  Administered 2015-09-09: 50 ug via INTRAMUSCULAR
  Filled 2015-09-09: qty 2

## 2015-09-09 MED ORDER — DEXAMETHASONE SODIUM PHOSPHATE 10 MG/ML IJ SOLN
10.0000 mg | Freq: Once | INTRAMUSCULAR | Status: AC
  Administered 2015-09-09: 10 mg via INTRAMUSCULAR
  Filled 2015-09-09: qty 1

## 2015-09-09 MED ORDER — OXYCODONE-ACETAMINOPHEN 5-325 MG PO TABS: ORAL_TABLET | ORAL | 0 refills | Status: DC

## 2015-09-09 NOTE — ED Triage Notes (Signed)
PT C/O right hip and leg pain that started a few days ago, was seen in er 09/06/2015, states that she is not getting any better, denies any injury,

## 2015-09-09 NOTE — Discharge Instructions (Signed)
Use ice and heat on the painful area. Take the prednisone as prescribed until gone. Please follow the instructions you were given last time.

## 2015-09-09 NOTE — ED Notes (Signed)
Pt alert & oriented x4. Patient given discharge instructions, paperwork & prescription(s). Patient verbalized understanding. Pt left department in wheelchair escorted by staff. Pt left w/ no further questions. 

## 2015-09-09 NOTE — ED Notes (Signed)
Pt taken to ct. nad 

## 2015-09-09 NOTE — ED Provider Notes (Signed)
[facility] Provider Note   CSN: [id number] Arrival date & time: 09/09/15  0035  Time Seen 01:42 AM   History   Chief Complaint Chief Complaint  Patient presents with  . Hip Pain    HPI Barbie HaggisBonnie F Dogan is a 70 y.o. female.  HPI patient was seen by me on August 18 for same complaint. She reports about 5 days ago she started getting pain in her right lateral hip that goes down to her knee. She states now the pain is a burning pain. She continues to deny low back pain. She has not had urinary or rectal incontinence except that she wet her self because she couldn't get to the bathroom in time because her leg hurt to move. She now states her feet started swelling a couple days ago. She states she's had swelling in her left foot before but did not see a doctor about it. She denies any abdominal swelling or distention, shortness of breath, chest pain, or dark urine. She has tramadol at home to take for pain. She was prescribed naproxen. She reports her pain is not improving. She was referred to orthopedics however she has not called to make that appointment. Patient does not have diabetes  [facility]   Past Medical History:  Diagnosis Date  . Abnormal pap   . Hemorrhoids   . HPV test positive 05/17/2013  . Hyperlipidemia   . Thyroid disease     Patient Active Problem List   Diagnosis Date Noted  . HPV test positive 05/17/2013  . Cataract 05/11/2012    Past Surgical History:  Procedure Laterality Date  . CATARACT EXTRACTION W/PHACO Left 05/16/2012   Procedure: CATARACT EXTRACTION PHACO AND INTRAOCULAR LENS PLACEMENT (IOC);  Surgeon: Gemma PayorKerry Hunt, MD;  Location: [facility];  Service: Ophthalmology;  Laterality: Left;  CDE:15.20  . COLONOSCOPY  2009   [facility]-Dr. Karilyn Cotaehman  . Fatty Tissue Excision Left 2008   [facility]-Dr. Hilda LiasKeeling  . right ankle Right     OB History    Gravida Para Term Preterm AB Living   1 1       1    SAB TAB Ectopic Multiple Live Births                    Home Medications    Prior to Admission medications   Medication Sig Start Date End Date Taking? Authorizing Provider  amLODipine (NORVASC) 5 MG tablet Take 1 tablet by mouth daily. 01/26/15  Yes Historical Provider, MD  aspirin EC 81 MG tablet Take 81 mg by mouth daily.   Yes Historical Provider, MD  Aspirin-Acetaminophen-Caffeine (GOODY HEADACHE PO) Take by mouth as needed.   Yes Historical Provider, MD  ibuprofen (ADVIL,MOTRIN) 200 MG tablet Take 600 mg by mouth every 6 (six) hours as needed for moderate pain.   Yes Historical Provider, MD  levothyroxine (SYNTHROID, LEVOTHROID) 50 MCG tablet Take 50 mcg by mouth daily. 06/06/14  Yes Historical Provider, MD  naproxen (NAPROSYN) 250 MG tablet Take 1 po BID with food prn pain 09/06/15  Yes Devoria AlbeIva Moody Robben, MD  simvastatin (ZOCOR) 20 MG tablet Take 20 mg by mouth at bedtime.   Yes Historical Provider, MD  traMADol (ULTRAM) 50 MG tablet Take by mouth every 6 (six) hours as needed.   Yes Historical Provider, MD  predniSONE (DELTASONE) 20 MG tablet Take 3 po QD x 3d , then 2 po QD x 3d then 1 po QD x 3d 09/09/15   Devoria AlbeIva Sirius Woodford, MD  Family History Family History  Problem Relation Age of Onset  . Heart attack Mother   . Heart disease Mother     CAD  . Heart disease Brother   . Heart disease Brother   . Hypertension Father   . Stroke Father     Social History Social History  Substance Use Topics  . Smoking status: Never Smoker  . Smokeless tobacco: Never Used  . Alcohol use No  employed   Allergies   Review of patient's allergies indicates no known allergies.   Review of Systems Review of Systems  All other systems reviewed and are negative.    Physical Exam Updated Vital Signs BP 147/67   Pulse 90   Temp 97.8 F (36.6 C) (Oral)   Resp 20   Ht 5\' 2"  (1.575 m)   Wt 120 lb (54.4 kg)   SpO2 98%   BMI 21.95 kg/m   Vital signs normal    Physical Exam  Constitutional: She is oriented to person, place, and time. She  appears well-developed and well-nourished.  Non-toxic appearance. She does not appear ill. No distress.  Patient speaks softly, and speaks very fast and rambles and mumbles making it hard to understand her.  HENT:  Head: Normocephalic and atraumatic.  Right Ear: External ear normal.  Left Ear: External ear normal.  Nose: Nose normal. No mucosal edema or rhinorrhea.  Mouth/Throat: Mucous membranes are normal. No dental abscesses or uvula swelling.  Eyes: Conjunctivae and EOM are normal.  Neck: Normal range of motion and full passive range of motion without pain.  Cardiovascular: Normal rate.   Pulmonary/Chest: Effort normal. No respiratory distress. She has no rhonchi. She exhibits no crepitus.  Abdominal: Normal appearance.  Musculoskeletal: Normal range of motion. She exhibits tenderness. She exhibits no edema.  Patient has possibly mild swelling of the dorsum of the left foot however there is no pain edema. Her right foot does not appear swollen and this is also verified by her daughter. Patient is very tender to palpation over the greater trochanter of her right hip. She has no pain in her back.  Neurological: She is alert and oriented to person, place, and time. She has normal strength. No cranial nerve deficit.  Skin: Skin is warm, dry and intact. No rash noted. No erythema. No pallor.  Psychiatric: She has a normal mood and affect. Her speech is normal and behavior is normal. Her mood appears not anxious.  Nursing note and vitals reviewed.     Procedures Procedures (including critical care time)  Medications Ordered in ED Medications  fentaNYL (SUBLIMAZE) injection 50 mcg (not administered)  dexamethasone (DECADRON) injection 10 mg (10 mg Intramuscular Given 09/09/15 0203)     Initial Impression / Assessment and Plan / ED Course  I have reviewed the triage vital signs and the nursing notes.  Pertinent labs & imaging results that were available during my care of the patient  were reviewed by me and considered in my medical decision making (see chart for details).  Clinical Course   We again discussed this appears to be trochanteric bursitis. She needs to follow-up with orthopedics which she has not done yet. Prednisone was added to her medical regimen. Should he has naproxen and tramadol for pain to take at home.    Final Clinical Impressions(s) / ED Diagnoses   Final diagnoses:  Trochanteric bursitis of right hip   New Prescriptions New Prescriptions   PREDNISONE (DELTASONE) 20 MG TABLET    Take 3  po QD x 3d , then 2 po QD x 3d then 1 po QD x 3d   Plan discharge  Devoria Albe, MD, Concha Pyo, MD 09/09/15 [phone]

## 2015-09-09 NOTE — ED Provider Notes (Signed)
[facility] Provider Note   CSN: [id number] Arrival date & time: 09/09/15  0806     History   Chief Complaint Chief Complaint  Patient presents with  . Hip Pain    HPI Julie Farrell is a 70 y.o. female.  HPI  Pt was seen at 0820. Per pt, c/o gradual onset and persistence of constant right hip "pain" for the past 1 week. Pt was evaluated in the ED x2 in the past 3 days, dx bursitis, rx steroid and tramadol. Pt states the medications are not helping her pain. States she has been sleeping in a chair and not drinking much "so I don't have to get up and go to the commode and sit down again because my hip hurts." Pain worsens with palpation of the area and body position changes. Pt states she is scheduled to see [place] next month and "get a MRI" of her lower back. Pt called her PMD today and was told to come back to the ED "for some xrays."  Denies dysuria/hematuria, no back/flank pain, no abd pain, no N/V/D, no fevers, no rash, no injury, no focal motor weakness, no tingling/numbness in extremities, no saddle anesthesia, no incont/retention of bowel or bladder.     Past Medical History:  Diagnosis Date  . Abnormal pap   . DDD (degenerative disc disease), lumbar   . Hemorrhoids   . HPV test positive 05/17/2013  . Hyperlipidemia   . Thyroid disease     Patient Active Problem List   Diagnosis Date Noted  . HPV test positive 05/17/2013  . Cataract 05/11/2012    Past Surgical History:  Procedure Laterality Date  . CATARACT EXTRACTION W/PHACO Left 05/16/2012   Procedure: CATARACT EXTRACTION PHACO AND INTRAOCULAR LENS PLACEMENT (IOC);  Surgeon: Gemma PayorKerry Hunt, MD;  Location: [facility];  Service: Ophthalmology;  Laterality: Left;  CDE:15.20  . COLONOSCOPY  2009   [facility]-Dr. Karilyn Cotaehman  . Fatty Tissue Excision Left 2008   [facility]-Dr. Hilda LiasKeeling  . right ankle Right     OB History    Gravida Para Term Preterm AB Living   1 1       1    SAB TAB Ectopic Multiple Live  Births                   Home Medications    Prior to Admission medications   Medication Sig Start Date End Date Taking? Authorizing Provider  amLODipine (NORVASC) 5 MG tablet Take 1 tablet by mouth daily. 01/26/15   Historical Provider, MD  aspirin EC 81 MG tablet Take 81 mg by mouth daily.    Historical Provider, MD  Aspirin-Acetaminophen-Caffeine (GOODY HEADACHE PO) Take by mouth as needed.    Historical Provider, MD  ibuprofen (ADVIL,MOTRIN) 200 MG tablet Take 600 mg by mouth every 6 (six) hours as needed for moderate pain.    Historical Provider, MD  levothyroxine (SYNTHROID, LEVOTHROID) 50 MCG tablet Take 50 mcg by mouth daily. 06/06/14   Historical Provider, MD  naproxen (NAPROSYN) 250 MG tablet Take 1 po BID with food prn pain 09/06/15   Devoria AlbeIva Knapp, MD  predniSONE (DELTASONE) 20 MG tablet Take 3 po QD x 3d , then 2 po QD x 3d then 1 po QD x 3d 09/09/15   Devoria AlbeIva Knapp, MD  simvastatin (ZOCOR) 20 MG tablet Take 20 mg by mouth at bedtime.    Historical Provider, MD  traMADol (ULTRAM) 50 MG tablet Take by mouth every 6 (six) hours  as needed.    Historical Provider, MD    Family History Family History  Problem Relation Age of Onset  . Heart attack Mother   . Heart disease Mother     CAD  . Heart disease Brother   . Heart disease Brother   . Hypertension Father   . Stroke Father     Social History Social History  Substance Use Topics  . Smoking status: Never Smoker  . Smokeless tobacco: Never Used  . Alcohol use No     Allergies   Review of patient's allergies indicates no known allergies.   Review of Systems Review of Systems ROS: Statement: All systems negative except as marked or noted in the HPI; Constitutional: Negative for fever and chills. ; ; Eyes: Negative for eye pain, redness and discharge. ; ; ENMT: Negative for ear pain, hoarseness, nasal congestion, sinus pressure and sore throat. ; ; Cardiovascular: Negative for chest pain, palpitations, diaphoresis, dyspnea  and peripheral edema. ; ; Respiratory: Negative for cough, wheezing and stridor. ; ; Gastrointestinal: Negative for nausea, vomiting, diarrhea, abdominal pain, blood in stool, hematemesis, jaundice and rectal bleeding. . ; ; Genitourinary: Negative for dysuria, flank pain and hematuria. ; ; Musculoskeletal: +right hip pain. Negative for back pain and neck pain. Negative for swelling and trauma.; ; Skin: Negative for pruritus, rash, abrasions, blisters, bruising and skin lesion.; ; Neuro: Negative for headache, lightheadedness and neck stiffness. Negative for weakness, altered level of consciousness, altered mental status, extremity weakness, paresthesias, involuntary movement, seizure and syncope.       Physical Exam Updated Vital Signs BP 148/60 (BP Location: Right Arm)   Pulse 96   Temp 98.4 F (36.9 C) (Oral)   Resp 18   Ht 5\' 2"  (1.575 m)   Wt 120 lb (54.4 kg)   SpO2 100%   BMI 21.95 kg/m   Physical Exam 0825: Physical examination:  Nursing notes reviewed; Vital signs and O2 SAT reviewed;  Constitutional: Well developed, Well nourished, Well hydrated, In no acute distress; Head:  Normocephalic, atraumatic; Eyes: EOMI, PERRL, No scleral icterus; ENMT: Mouth and pharynx normal, Mucous membranes moist; Neck: Supple, Full range of motion, No lymphadenopathy; Cardiovascular: Regular rate and rhythm, No gallop; Respiratory: Breath sounds clear & equal bilaterally, No wheezes.  Speaking full sentences with ease, Normal respiratory effort/excursion; Chest: Nontender, Movement normal; Abdomen: Soft, Nontender, Nondistended, Normal bowel sounds; Genitourinary: No CVA tenderness; Spine:  No midline CS, TS, LS tenderness.;; Extremities: Pulses normal, Pelvis stable. +right hip tenderness to palp. NT right knee/ankle/foot. No edema, No calf edema or asymmetry.; Neuro: AA&Ox3, Major CN grossly intact.  Speech soft and mumbling. No gross focal motor or sensory deficits in extremities. Strength 5/5 equal  bilat UE's and LE's, including great toe dorsiflexion.  DTR 2/4 equal bilat UE's and LE's.  No gross sensory deficits.  Neg straight leg raises bilat.; Skin: Color normal, Warm, Dry.   ED Treatments / Results  Labs (all labs ordered are listed, but only abnormal results are displayed)   EKG  EKG Interpretation None       Radiology   Procedures Procedures (including critical care time)  Medications Ordered in ED Medications  fentaNYL (SUBLIMAZE) injection 50 mcg (not administered)     Initial Impression / Assessment and Plan / ED Course  I have reviewed the triage vital signs and the nursing notes.  Pertinent labs & imaging results that were available during my care of the patient were reviewed by me and considered in  my medical decision making (see chart for details).  MDM Reviewed: previous chart, nursing note and vitals Reviewed previous: labs and CT scan Interpretation: labs, CT scan and x-ray   Results for orders placed or performed during the hospital encounter of 09/09/15  Urinalysis, Routine w reflex microscopic  Result Value Ref Range   Color, Urine YELLOW YELLOW   APPearance CLEAR CLEAR   Specific Gravity, Urine 1.020 1.005 - 1.030   pH 5.5 5.0 - 8.0   Glucose, UA NEGATIVE NEGATIVE mg/dL   Hgb urine dipstick MODERATE (A) NEGATIVE   Bilirubin Urine NEGATIVE NEGATIVE   Ketones, ur 15 (A) NEGATIVE mg/dL   Protein, ur NEGATIVE NEGATIVE mg/dL   Nitrite NEGATIVE NEGATIVE   Leukocytes, UA NEGATIVE NEGATIVE  Basic metabolic panel  Result Value Ref Range   Sodium 136 135 - 145 mmol/L   Potassium 4.0 3.5 - 5.1 mmol/L   Chloride 102 101 - 111 mmol/L   CO2 27 22 - 32 mmol/L   Glucose, Bld 146 (H) 65 - 99 mg/dL   BUN 28 (H) 6 - 20 mg/dL   Creatinine, Ser 1.61 0.44 - 1.00 mg/dL   Calcium 9.8 8.9 - 09.6 mg/dL   GFR calc non Af Amer 58 (L) >60 mL/min   GFR calc Af Amer >60 >60 mL/min   Anion gap 7 5 - 15  CBC with Differential  Result Value Ref Range   WBC  6.2 4.0 - 10.5 K/uL   RBC 4.50 3.87 - 5.11 MIL/uL   Hemoglobin 13.8 12.0 - 15.0 g/dL   HCT 04.5 40.9 - 81.1 %   MCV 89.8 78.0 - 100.0 fL   MCH 30.7 26.0 - 34.0 pg   MCHC 34.2 30.0 - 36.0 g/dL   RDW 91.4 78.2 - 95.6 %   Platelets 284 150 - 400 K/uL   Neutrophils Relative % 95 %   Neutro Abs 5.9 1.7 - 7.7 K/uL   Lymphocytes Relative 4 %   Lymphs Abs 0.3 (L) 0.7 - 4.0 K/uL   Monocytes Relative 1 %   Monocytes Absolute 0.0 (L) 0.1 - 1.0 K/uL   Eosinophils Relative 0 %   Eosinophils Absolute 0.0 0.0 - 0.7 K/uL   Basophils Relative 0 %   Basophils Absolute 0.0 0.0 - 0.1 K/uL  Urine microscopic-add on  Result Value Ref Range   Squamous Epithelial / LPF NONE SEEN NONE SEEN   WBC, UA NONE SEEN 0 - 5 WBC/hpf   RBC / HPF 6-30 0 - 5 RBC/hpf   Bacteria, UA RARE (A) NONE SEEN    Ct Renal Stone Study Result Date: 09/09/2015 CLINICAL DATA:  Right-sided flank pain for 6 days. EXAM: CT ABDOMEN AND PELVIS WITHOUT CONTRAST TECHNIQUE: Multidetector CT imaging of the abdomen and pelvis was performed following the standard protocol without IV contrast. COMPARISON:  CT scan 02/17/2015 FINDINGS: Lower chest: The lung bases are clear of acute process. No pleural effusion or pulmonary lesions. The heart is normal in size. No pericardial effusion. The distal esophagus and aorta are unremarkable. Hepatobiliary: Stable small low-attenuation right hepatic lobe lesion, likely benign cysts. No worrisome hepatic lesions or intrahepatic biliary dilatation. The gallbladder is normal. No common bile duct dilatation. Pancreas: No mass, inflammation or ductal dilatation. Spleen: Normal size.  No focal lesions. Adrenals/Urinary Tract: The adrenal glands are normal in stable. No renal, ureteral or bladder calculi or mass. Stomach/Bowel: The stomach, duodenum, small bowel and colon are grossly normal without oral contrast. No inflammatory changes, mass lesions  or obstructive findings. The terminal ileum is normal. The appendix is  normal. Vascular/Lymphatic: Scattered aortic calcifications. No aneurysm. No mesenteric or retroperitoneal mass or adenopathy. Reproductive: The uterus and ovaries are unremarkable. Small amount of free pelvic fluid is noted. Other: No pelvic mass or adenopathy. No inguinal mass or adenopathy. No abdominal wall hernia or subcutaneous lesions. Musculoskeletal: Non isthmic spondylolisthesis at L4. There is a broad-based disc protrusion along with uncovering of the disc. There is significant spinal and bilateral foraminal stenosis which could be symptomatic. MRI lumbar spine may be helpful for further evaluation. IMPRESSION: 1. No acute abdominal/pelvic findings, mass lesions or adenopathy. 2. No renal or obstructing ureteral calculi. 3. Stable small low-attenuation liver lesion consistent with a benign cyst. 4. Non isthmic spondylolisthesis at L4 with a broad-based disc protrusion, uncovered disc and facet disease contributing to significant spinal and bilateral foraminal stenosis. Electronically Signed   By: Rudie MeyerP.  Gallerani M.D.   On: 09/09/2015 09:52   Dg Hip Unilat With Pelvis 2-3 Views Right Result Date: 09/09/2015 CLINICAL DATA:  Right hip pain EXAM: DG HIP (WITH OR WITHOUT PELVIS) 2-3V RIGHT COMPARISON:  None. FINDINGS: Degenerative changes lumbar spine both hips. Diffuse osteopenia. No acute soft tissue bony abnormality identified. IMPRESSION: Diffuse osteopenia and degenerative change. No acute abnormality. No evidence of fracture or dislocation. Electronically Signed   By: Maisie Fushomas  Register   On: 09/09/2015 09:32    1035:  Per Rads MD:  LS on CT today appears similar to CT scan 02/17/2015. EPIC chart reviewed: pt with normal ABI's on 02/20/2015 Arterial Vascular study. [facility] to [facility], case discussed, including:  HPI, pertinent PM/SHx, VS/PE, dx testing, ED course and treatment:  Pt was seen in office 3 months ago, c/o LBP, MRI not deemed necessary at that time, and referral was  given to Neurosurgeon Dr. Lovell SheehanJenkins. No indication for MRI LS today (CT scan similar to 7 months ago, neuro exam intact) or repeat vascular study (strong pedal pulses on exam). Pt requesting a dose of percocet; will order.   1100:  Pt has climbed off the stretcher by herself and ambulated with a walker without distress, resps easy, gait steady. Pt's family in the exam room on the telephone, states they have talked with [place] "[place]" and "they [facility] No indication for this given pt's unchanged CT scan from 7 months ago and reassuring dx testing today, intact neurological exam, and she is able to ambulate.  T/C to Neurosurgeon Dr. Bevely Palmeritty, case discussed, including:  HPI, pertinent PM/SHx, VS/PE, dx testing, ED course and treatment:  He has viewed the CT images, states no acute neurosurgical emergency at this time, no need to [facility], pt can be seen in office next week as scheduled with Dr. Lovell SheehanJenkins, agrees with prednisone rx, pain control, walker.  Dx and testing, as well as d/w Neurosurgeon, d/w pt and family.  Questions answered.  Verb understanding, agreeable to d/c home with outpt f/u.       Final Clinical Impressions(s) / ED Diagnoses   Final diagnoses:  Hip pain  DDD (degenerative disc disease), lumbar    New Prescriptions New Prescriptions   No medications on file     Samuel JesterKathleen Jaydn Moscato, DO 09/13/15 1458

## 2015-09-09 NOTE — ED Triage Notes (Signed)
Patient sent by Dr Phillips OdorGolding. Complaining of right hip pain x 6 days. States she was seen here x 2 this weekend. States patient is now not eating or drinking and has little urine output.

## 2015-09-09 NOTE — ED Notes (Signed)
Pt states burning pain down right leg. Was seen in the ER for the same 3 days ago. Says she is taking medications w/o relief.

## 2015-09-09 NOTE — ED Notes (Signed)
Pt ambulated weill with steady gait wit hwalker. Pt reuqested walker and percocet rx. edp aware. No obivous ss of pain noted.

## 2015-09-09 NOTE — Discharge Instructions (Signed)
Take the prescription as directed. Continue to take the prednisone prescription given to you during your last ED visit.  Apply moist heat or ice to the area(s) of discomfort, for 15 minutes at a time, several times per day for the next few days.  Do not fall asleep on a heating or ice pack.  Call your regular medical doctor today to schedule a follow up appointment this week. Call the Neurosurgeon today to confirm your previously scheduled appointment for next week. Return to the Emergency Department immediately if worsening.

## 2015-09-09 NOTE — ED Notes (Signed)
Fentanyl 50mg  wasted in needle box. Witnessed by Drinda Buttsindy Bortz, RN

## 2015-09-10 DIAGNOSIS — M4316 Spondylolisthesis, lumbar region: Secondary | ICD-10-CM | POA: Diagnosis not present

## 2015-09-10 DIAGNOSIS — M5416 Radiculopathy, lumbar region: Secondary | ICD-10-CM | POA: Diagnosis not present

## 2015-09-10 DIAGNOSIS — R292 Abnormal reflex: Secondary | ICD-10-CM | POA: Diagnosis not present

## 2015-09-10 DIAGNOSIS — M4806 Spinal stenosis, lumbar region: Secondary | ICD-10-CM | POA: Diagnosis not present

## 2015-09-11 LAB — URINE CULTURE

## 2015-09-20 DIAGNOSIS — R292 Abnormal reflex: Secondary | ICD-10-CM | POA: Diagnosis not present

## 2015-09-20 DIAGNOSIS — M4316 Spondylolisthesis, lumbar region: Secondary | ICD-10-CM | POA: Diagnosis not present

## 2015-09-20 DIAGNOSIS — M47812 Spondylosis without myelopathy or radiculopathy, cervical region: Secondary | ICD-10-CM | POA: Diagnosis not present

## 2015-09-20 DIAGNOSIS — M4806 Spinal stenosis, lumbar region: Secondary | ICD-10-CM | POA: Diagnosis not present

## 2015-09-27 DIAGNOSIS — M4806 Spinal stenosis, lumbar region: Secondary | ICD-10-CM | POA: Diagnosis not present

## 2015-09-27 DIAGNOSIS — M4316 Spondylolisthesis, lumbar region: Secondary | ICD-10-CM | POA: Diagnosis not present

## 2015-09-30 ENCOUNTER — Other Ambulatory Visit: Payer: Self-pay | Admitting: Neurosurgery

## 2015-10-18 ENCOUNTER — Other Ambulatory Visit (HOSPITAL_COMMUNITY): Payer: Self-pay | Admitting: *Deleted

## 2015-10-18 ENCOUNTER — Encounter (HOSPITAL_COMMUNITY): Payer: Self-pay

## 2015-10-18 ENCOUNTER — Encounter (HOSPITAL_COMMUNITY)
Admission: RE | Admit: 2015-10-18 | Discharge: 2015-10-18 | Disposition: A | Discharge status: Home / Self Care | Payer: Commercial Managed Care - HMO | Source: Ambulatory Visit | Attending: Neurosurgery | Admitting: Neurosurgery

## 2015-10-18 DIAGNOSIS — Z01818 Encounter for other preprocedural examination: Secondary | ICD-10-CM | POA: Diagnosis not present

## 2015-10-18 HISTORY — DX: Anxiety disorder, unspecified: F41.9

## 2015-10-18 HISTORY — DX: Essential (primary) hypertension: I10

## 2015-10-18 HISTORY — DX: Hypothyroidism, unspecified: E03.9

## 2015-10-18 LAB — BASIC METABOLIC PANEL
Anion gap: 5 (ref 5–15)
BUN: 17 mg/dL (ref 6–20)
CALCIUM: 10.2 mg/dL (ref 8.9–10.3)
CO2: 26 mmol/L (ref 22–32)
CREATININE: 0.89 mg/dL (ref 0.44–1.00)
Chloride: 110 mmol/L (ref 101–111)
GFR calc non Af Amer: >60 mL/min (ref >60)
GLUCOSE: 110 mg/dL — AB (ref 65–99)
Potassium: 4.1 mmol/L (ref 3.5–5.1)
Sodium: 141 mmol/L (ref 135–145)

## 2015-10-18 LAB — CBC
HCT: 42.5 % (ref 36.0–46.0)
Hemoglobin: 13.7 g/dL (ref 12.0–15.0)
MCH: 29.9 pg (ref 26.0–34.0)
MCHC: 32.2 g/dL (ref 30.0–36.0)
MCV: 92.8 fL (ref 78.0–100.0)
PLATELETS: 280 10*3/uL (ref 150–400)
RBC: 4.58 MIL/uL (ref 3.87–5.11)
RDW: 12.7 % (ref 11.5–15.5)
WBC: 6.6 10*3/uL (ref 4.0–10.5)

## 2015-10-18 LAB — TYPE AND SCREEN
ABO/RH(D): A POS
Antibody Screen: NEGATIVE

## 2015-10-18 LAB — SURGICAL PCR SCREEN
MRSA, PCR: NEGATIVE
Staphylococcus aureus: NEGATIVE

## 2015-10-18 LAB — ABO/RH: ABO/RH(D): A POS

## 2015-10-18 NOTE — Pre-Procedure Instructions (Addendum)
Barbie HaggisBonnie F Hauk  10/18/2015      [id number] - [place], [place] - [id number] [place] [id number] Phone: [phone] Fax: [phone]    Your procedure is scheduled on Thursday, October 31, 2015  AM.   [facility] at 5:30 AM.   Call this number if you have problems the morning of surgery: [phone]   Questions prior to day of surgery, please call [phone] between 8 & 4 PM.   Remember:  Do not eat food or drink liquids after midnight Wednesday, 10/30/15.  Take these medicines the morning of surgery with A SIP OF WATER: Amlodipine (Norvasc), Levothyroxine (Synthroid), Alprazolam (Xanax) - if needed, oxycodone if needed  Stop all Aspirin products and NSAIDS (Ibuprofen, Naprosyn, Aleve, etc.) 7 days prior to surgery(10/24/15).also goody's, vitamins   Do not wear jewelry, make-up or nail polish.  Do not wear lotions, powders, or perfumes.  Do not shave 48 hours prior to surgery.   Do not bring valuables to the hospital.  [facility] is not responsible for any belongings or valuables.  Contacts, dentures or bridgework may not be worn into surgery.  Leave your suitcase in the car.  After surgery it may be brought to your room.  For patients admitted to the hospital, discharge time will be determined by your treatment team.  Special instructions:  [place] - Preparing for Surgery  Before surgery, you can play an important role.  Because skin is not sterile, your skin needs to be as free of germs as possible.  You can reduce the number of germs on you skin by washing with CHG (chlorahexidine gluconate) soap before surgery.  CHG is an antiseptic cleaner which kills germs and bonds with the skin to continue killing germs even after washing.  Please DO NOT use if you have an allergy to CHG or antibacterial soaps.  If your skin becomes reddened/irritated stop using the CHG and inform  your nurse when you arrive at Short Stay.  Do not shave (including legs and underarms) for at least 48 hours prior to the first CHG shower.  You may shave your face.  Please follow these instructions carefully:   1.  Shower with CHG Soap the night before surgery and the                    morning of Surgery.  2.  If you choose to wash your hair, wash your hair first as usual with your       normal shampoo.  3.  After you shampoo, rinse your hair and body thoroughly to remove the shampoo.  4.  Use CHG as you would any other liquid soap.  You can apply chg directly       to the skin and wash gently with scrungie or a clean washcloth.  5.  Apply the CHG Soap to your body ONLY FROM THE NECK DOWN.        Do not use on open wounds or open sores.  Avoid contact with your eyes, ears, mouth and genitals (private parts).  Wash genitals (private parts) with your normal soap.  6.  Wash thoroughly, paying special attention to the area where your surgery        will be performed.  7.  Thoroughly rinse your body with warm water from the neck down.  8.  DO NOT shower/wash with your normal soap after using  and rinsing off       the CHG Soap.  9.  Pat yourself dry with a clean towel.            10.  Wear clean pajamas.            11.  Place clean sheets on your bed the night of your first shower and do not        sleep with pets.  Day of Surgery  Do not apply any lotions the morning of surgery.  Please wear clean clothes to the hospital.   Please read over the  fact sheets that you were given.

## 2015-10-20 HISTORY — PX: BACK SURGERY: SHX140

## 2015-10-29 ENCOUNTER — Encounter (HOSPITAL_COMMUNITY): Payer: Self-pay | Admitting: Emergency Medicine

## 2015-10-29 ENCOUNTER — Emergency Department (HOSPITAL_COMMUNITY): Payer: Commercial Managed Care - HMO

## 2015-10-29 ENCOUNTER — Emergency Department (HOSPITAL_COMMUNITY)
Admission: EM | Admit: 2015-10-29 | Discharge: 2015-10-29 | Disposition: A | Discharge status: Home / Self Care | Payer: Commercial Managed Care - HMO | Source: Home / Self Care | Attending: Emergency Medicine | Admitting: Emergency Medicine

## 2015-10-29 DIAGNOSIS — R1110 Vomiting, unspecified: Secondary | ICD-10-CM | POA: Insufficient documentation

## 2015-10-29 DIAGNOSIS — E039 Hypothyroidism, unspecified: Secondary | ICD-10-CM | POA: Diagnosis present

## 2015-10-29 DIAGNOSIS — Z8249 Family history of ischemic heart disease and other diseases of the circulatory system: Secondary | ICD-10-CM | POA: Diagnosis not present

## 2015-10-29 DIAGNOSIS — R1084 Generalized abdominal pain: Secondary | ICD-10-CM | POA: Diagnosis not present

## 2015-10-29 DIAGNOSIS — M4326 Fusion of spine, lumbar region: Secondary | ICD-10-CM | POA: Diagnosis not present

## 2015-10-29 DIAGNOSIS — Z823 Family history of stroke: Secondary | ICD-10-CM | POA: Diagnosis not present

## 2015-10-29 DIAGNOSIS — M5136 Other intervertebral disc degeneration, lumbar region: Secondary | ICD-10-CM | POA: Diagnosis present

## 2015-10-29 DIAGNOSIS — I10 Essential (primary) hypertension: Secondary | ICD-10-CM

## 2015-10-29 DIAGNOSIS — Z961 Presence of intraocular lens: Secondary | ICD-10-CM | POA: Diagnosis present

## 2015-10-29 DIAGNOSIS — E785 Hyperlipidemia, unspecified: Secondary | ICD-10-CM | POA: Diagnosis present

## 2015-10-29 DIAGNOSIS — Z79899 Other long term (current) drug therapy: Secondary | ICD-10-CM | POA: Insufficient documentation

## 2015-10-29 DIAGNOSIS — M79606 Pain in leg, unspecified: Secondary | ICD-10-CM | POA: Diagnosis present

## 2015-10-29 DIAGNOSIS — M4316 Spondylolisthesis, lumbar region: Secondary | ICD-10-CM | POA: Diagnosis present

## 2015-10-29 DIAGNOSIS — Z7982 Long term (current) use of aspirin: Secondary | ICD-10-CM

## 2015-10-29 DIAGNOSIS — R079 Chest pain, unspecified: Secondary | ICD-10-CM

## 2015-10-29 DIAGNOSIS — K5900 Constipation, unspecified: Secondary | ICD-10-CM

## 2015-10-29 DIAGNOSIS — Z791 Long term (current) use of non-steroidal anti-inflammatories (NSAID): Secondary | ICD-10-CM | POA: Insufficient documentation

## 2015-10-29 DIAGNOSIS — M48062 Spinal stenosis, lumbar region with neurogenic claudication: Secondary | ICD-10-CM | POA: Diagnosis not present

## 2015-10-29 DIAGNOSIS — M5416 Radiculopathy, lumbar region: Secondary | ICD-10-CM | POA: Diagnosis present

## 2015-10-29 DIAGNOSIS — Z9842 Cataract extraction status, left eye: Secondary | ICD-10-CM | POA: Diagnosis not present

## 2015-10-29 DIAGNOSIS — R109 Unspecified abdominal pain: Secondary | ICD-10-CM | POA: Diagnosis not present

## 2015-10-29 LAB — URINALYSIS, ROUTINE W REFLEX MICROSCOPIC
Bilirubin Urine: NEGATIVE
GLUCOSE, UA: NEGATIVE mg/dL
Ketones, ur: NEGATIVE mg/dL
LEUKOCYTES UA: NEGATIVE
Nitrite: NEGATIVE
PH: 6 (ref 5.0–8.0)
Protein, ur: NEGATIVE mg/dL
Specific Gravity, Urine: 1.01 (ref 1.005–1.030)

## 2015-10-29 LAB — COMPREHENSIVE METABOLIC PANEL
ALT: 19 U/L (ref 14–54)
ANION GAP: 7 (ref 5–15)
AST: 24 U/L (ref 15–41)
Albumin: 4.4 g/dL (ref 3.5–5.0)
Alkaline Phosphatase: 52 U/L (ref 38–126)
BUN: 9 mg/dL (ref 6–20)
CHLORIDE: 103 mmol/L (ref 101–111)
CO2: 27 mmol/L (ref 22–32)
Calcium: 10.3 mg/dL (ref 8.9–10.3)
Creatinine, Ser: 0.81 mg/dL (ref 0.44–1.00)
Glucose, Bld: 154 mg/dL — ABNORMAL HIGH (ref 65–99)
POTASSIUM: 3.6 mmol/L (ref 3.5–5.1)
Sodium: 137 mmol/L (ref 135–145)
Total Bilirubin: 0.5 mg/dL (ref 0.3–1.2)
Total Protein: 7.2 g/dL (ref 6.5–8.1)

## 2015-10-29 LAB — CBC
HEMATOCRIT: 40.5 % (ref 36.0–46.0)
HEMOGLOBIN: 13.8 g/dL (ref 12.0–15.0)
MCH: 31.2 pg (ref 26.0–34.0)
MCHC: 34.1 g/dL (ref 30.0–36.0)
MCV: 91.6 fL (ref 78.0–100.0)
PLATELETS: 286 10*3/uL (ref 150–400)
RBC: 4.42 MIL/uL (ref 3.87–5.11)
RDW: 12.7 % (ref 11.5–15.5)
WBC: 7.8 10*3/uL (ref 4.0–10.5)

## 2015-10-29 LAB — LIPASE, BLOOD: LIPASE: 19 U/L (ref 11–51)

## 2015-10-29 LAB — URINE MICROSCOPIC-ADD ON

## 2015-10-29 MED ORDER — LACTATED RINGERS IV BOLUS (SEPSIS)
1000.0000 mL | Freq: Once | INTRAVENOUS | Status: AC
  Administered 2015-10-29: 1000 mL via INTRAVENOUS

## 2015-10-29 MED ORDER — LACTATED RINGERS IV BOLUS (SEPSIS)
1000.0000 mL | Freq: Once | INTRAVENOUS | Status: AC
Start: 2015-10-29 — End: 2015-10-29
  Administered 2015-10-29: 1000 mL via INTRAVENOUS

## 2015-10-29 MED ORDER — ONDANSETRON 4 MG PO TBDP: 4.0000 mg | ORAL_TABLET | Freq: Three times a day (TID) | ORAL | 0 refills | Status: DC | PRN

## 2015-10-29 MED ORDER — LORAZEPAM 2 MG/ML IJ SOLN
1.0000 mg | Freq: Once | INTRAMUSCULAR | Status: AC
  Administered 2015-10-29: 1 mg via INTRAVENOUS

## 2015-10-29 MED ORDER — LORAZEPAM 2 MG/ML IJ SOLN
INTRAMUSCULAR | Status: AC
  Filled 2015-10-29: qty 1

## 2015-10-29 NOTE — ED Provider Notes (Signed)
[facility]-EMER[facility]Y DEPT Provider Note   CSN: [id number] Arrival date & time: 10/29/15  1234  By signing my name below, I, Majel Homer, attest that this documentation has been prepared under the direction and in the presence of Gerhard Munch, MD . Electronically Signed: Majel Homer, Scribe. 10/29/2015. 2:00 PM.  History   Chief Complaint Chief Complaint  Patient presents with  . Abdominal Pain   The history is provided by the patient. No language interpreter was used.   HPI Comments: Julie Farrell is a 70 y.o. female with PMHx of HTN and HLD, who presents to the Emergency Department complaining of gradually worsening, constipation and upper abdominal pain that began 4 days ago. Pt reports she used an enema 4 days ago and had a small BM soon after but has not had another BM since then. She states she has not eaten any food due to pain for the past 4 days and experienced 1 episode of vomiting yesterday. She notes she has taken codeine for her pain with no relief. Pt also reports she is scheduled to have back surgery on 10/12 and is nervous for this. She states she experienced mild chest pain earlier today secondary to a "panic attack" which is normal for her. She denies any new weakness in her extremities, hx of cancer and PSHx to her abdomen.   Past Medical History:  Diagnosis Date  . Abnormal pap   . Anxiety   . DDD (degenerative disc disease), lumbar   . Hemorrhoids   . HPV test positive 05/17/2013  . Hyperlipidemia   . Hypertension   . Hypothyroidism   . Thyroid disease     Patient Active Problem List   Diagnosis Date Noted  . HPV test positive 05/17/2013  . Cataract 05/11/2012    Past Surgical History:  Procedure Laterality Date  . CATARACT EXTRACTION W/PHACO Left 05/16/2012   Procedure: CATARACT EXTRACTION PHACO AND INTRAOCULAR LENS PLACEMENT (IOC);  Surgeon: Gemma Payor, MD;  Location: [facility] ORS;  Service: Ophthalmology;  Laterality: Left;  CDE:15.20  . COLONOSCOPY  2009   [facility]-Dr. Karilyn Cota  . EYE SURGERY Bilateral 14,15   cataracts  . Fatty Tissue Excision Left 2008   [facility]-Dr. Hilda Lias arm  . right ankle Right    fx    OB History    Gravida Para Term Preterm AB Living   1 1       1    SAB TAB Ectopic Multiple Live Births                 Home Medications    Prior to Admission medications   Medication Sig Start Date End Date Taking? Authorizing Provider  ALPRAZolam Prudy Feeler) 0.5 MG tablet Take 0.25-0.5 mg by mouth 3 (three) times daily as needed for anxiety.  10/08/15   Historical Provider, MD  amLODipine (NORVASC) 5 MG tablet Take 5 mg by mouth daily.  01/26/15   Historical Provider, MD  aspirin EC 81 MG tablet Take 81 mg by mouth daily.    Historical Provider, MD  Aspirin-Acetaminophen-Caffeine (GOODY HEADACHE PO) Take 1 each by mouth as needed (for headache).     Historical Provider, MD  ibuprofen (ADVIL,MOTRIN) 200 MG tablet Take 200 mg by mouth every 6 (six) hours as needed for moderate pain.     Historical Provider, MD  levothyroxine (SYNTHROID, LEVOTHROID) 50 MCG tablet Take 50 mcg by mouth daily. 06/06/14   Historical Provider, MD  naproxen (NAPROSYN) 250 MG tablet Take 1 po BID with food prn  pain Patient not taking: Reported on 10/16/2015 09/06/15   Devoria AlbeIva Knapp, MD  oxyCODONE-acetaminophen (PERCOCET/ROXICET) 5-325 MG tablet 1 or 2 tabs PO q12h prn pain Patient taking differently: Take 1 tablet by mouth every 12 (twelve) hours as needed for moderate pain.  09/09/15   Samuel JesterKathleen McManus, DO  predniSONE (DELTASONE) 20 MG tablet Take 3 po QD x 3d , then 2 po QD x 3d then 1 po QD x 3d Patient not taking: Reported on 10/16/2015 09/09/15   Devoria AlbeIva Knapp, MD  simvastatin (ZOCOR) 20 MG tablet Take 20 mg by mouth at bedtime.    Historical Provider, MD    Family History Family History  Problem Relation Age of Onset  . Heart attack Mother   . Heart disease Mother     CAD  . Heart disease Brother   . Heart disease Brother   . Hypertension Father   . Stroke Father      Social History Social History  Substance Use Topics  . Smoking status: Never Smoker  . Smokeless tobacco: Never Used  . Alcohol use No     Allergies   Review of patient's allergies indicates no known allergies.   Review of Systems Review of Systems  Constitutional: Positive for appetite change.  Cardiovascular: Positive for chest pain.  Gastrointestinal: Positive for abdominal pain, constipation and vomiting.  Neurological: Negative for weakness.   Physical Exam Updated Vital Signs BP 143/80 (BP Location: Left Arm)   Pulse 87   Temp 98.2 F (36.8 C) (Oral)   Resp 20   Ht 5\' 2"  (1.575 m)   Wt 115 lb (52.2 kg)   SpO2 100%   BMI 21.03 kg/m   Physical Exam  Constitutional: She is oriented to person, place, and time. She has a sickly appearance. No distress.  HENT:  Head: Normocephalic and atraumatic.  Eyes: Conjunctivae and EOM are normal.  Cardiovascular: Normal rate and regular rhythm.   Pulmonary/Chest: Effort normal and breath sounds normal. No stridor. No respiratory distress.  Abdominal: She exhibits no distension.  Minimal discomfort anywhere with palpation  Musculoskeletal: She exhibits no edema.  Neurological: She is alert and oriented to person, place, and time. She displays atrophy. No cranial nerve deficit.  Skin: Skin is warm and dry.  Psychiatric: She has a normal mood and affect.  Nursing note and vitals reviewed.    ED Treatments / Results  Labs (all labs ordered are listed, but only abnormal results are displayed) Labs Reviewed  COMPREHENSIVE METABOLIC PANEL - Abnormal; Notable for the following:       Result Value   Glucose, Bld 154 (*)    All other components within normal limits  URINALYSIS, ROUTINE W REFLEX MICROSCOPIC (NOT AT [facility]) - Abnormal; Notable for the following:    Hgb urine dipstick SMALL (*)    All other components within normal limits  URINE MICROSCOPIC-ADD ON - Abnormal; Notable for the following:    Squamous Epithelial  / LPF 0-5 (*)    Bacteria, UA RARE (*)    All other components within normal limits  LIPASE, BLOOD  CBC    Radiology Dg Abd Acute W/chest  Result Date: 10/29/2015 CLINICAL DATA:  Abdominal pain. EXAM: DG ABDOMEN ACUTE W/ 1V CHEST COMPARISON:  09/09/2015 CT FINDINGS: Chronic borderline cardiomegaly. Mild aortic tortuosity. There is no edema, consolidation, effusion, or pneumothorax. Nonobstructive bowel gas pattern. No concerning gas collection or mass effect. No evidence of urolithiasis. Osteopenia. IMPRESSION: No acute finding in the chest or abdomen. Electronically  Signed   By: Marnee Spring M.D.   On: 10/29/2015 14:49    Procedures Procedures (including critical care time)  Medications Ordered in ED Medications  lactated ringers bolus 1,000 mL (0 mLs Intravenous Stopped 10/29/15 1517)  LORazepam (ATIVAN) injection 1 mg (1 mg Intravenous Given 10/29/15 1527)  lactated ringers bolus 1,000 mL (0 mLs Intravenous Stopped 10/29/15 1716)    DIAGNOSTIC STUDIES:  Oxygen Saturation is 100% on RA, normal by my interpretation.    COORDINATION OF CARE:  1:56 PM Discussed treatment plan with pt at bedside and pt agreed to plan.  Initial Impression / Assessment and Plan / ED Course  I have reviewed the triage vital signs and the nursing notes.  Pertinent labs & imaging results that were available during my care of the patient were reviewed by me and considered in my medical decision making (see chart for details).  Clinical Course    3:26 PM Patient awake and alert, states that she feels better after initial fluid resuscitation.   I personally performed the services described in this documentation, which was scribed in my presence. The recorded information has been reviewed and is accurate.   Final Clinical Impressions(s) / ED Diagnoses  Patient presents with concern of ongoing abdominal pain, constipation. Notably, the patient uses narcotics for ongoing chronic pain issues, for  which she is scheduled for outpatient surgery in 2 days. Patient is awake alert, though listless initially. She improved substantially fluid resuscitation. Labs largely reassuring.  Patient has been able to have bowel movements as well. Patient encouraged to stay well hydrated, minimize narcotic use, and be sure to follow-up with her primary care physician. No evidence for obstruction, infection, substantial abnormalities.    Gerhard Munch, MD 10/29/15 1725

## 2015-10-29 NOTE — ED Notes (Signed)
Pt returned from X-ray.  

## 2015-10-29 NOTE — Discharge Instructions (Signed)
As discussed, your evaluation today has been largely reassuring.  But, it is important that you monitor your condition carefully, and do not hesitate to return to the ED if you develop new, or concerning changes in your condition. ? ?Otherwise, please follow-up with your physician for appropriate ongoing care. ? ?

## 2015-10-29 NOTE — ED Notes (Signed)
Patient transported to X-ray 

## 2015-10-29 NOTE — ED Triage Notes (Signed)
Pt reports constipation assoc with narcotic dependency. Pt scheduled for surgery on her back on 9/12. Pt has been unable to eat since Fri. Reports some emesis.

## 2015-10-31 ENCOUNTER — Inpatient Hospital Stay (HOSPITAL_COMMUNITY): Payer: Commercial Managed Care - HMO

## 2015-10-31 ENCOUNTER — Encounter (HOSPITAL_COMMUNITY): Payer: Self-pay | Admitting: *Deleted

## 2015-10-31 ENCOUNTER — Encounter (HOSPITAL_COMMUNITY)
Admission: RE | Disposition: A | Discharge status: Home / Self Care | Payer: Self-pay | Source: Ambulatory Visit | Attending: Neurosurgery

## 2015-10-31 ENCOUNTER — Inpatient Hospital Stay (HOSPITAL_COMMUNITY): Payer: Commercial Managed Care - HMO | Admitting: Anesthesiology

## 2015-10-31 ENCOUNTER — Inpatient Hospital Stay (HOSPITAL_COMMUNITY)
Admission: RE | Admit: 2015-10-31 | Discharge: 2015-11-01 | DRG: 460 | Disposition: A | Discharge status: Home / Self Care | Payer: Commercial Managed Care - HMO | Source: Ambulatory Visit | Attending: Neurosurgery | Admitting: Neurosurgery

## 2015-10-31 DIAGNOSIS — Z8249 Family history of ischemic heart disease and other diseases of the circulatory system: Secondary | ICD-10-CM

## 2015-10-31 DIAGNOSIS — M79606 Pain in leg, unspecified: Secondary | ICD-10-CM | POA: Diagnosis present

## 2015-10-31 DIAGNOSIS — Z961 Presence of intraocular lens: Secondary | ICD-10-CM | POA: Diagnosis present

## 2015-10-31 DIAGNOSIS — Z419 Encounter for procedure for purposes other than remedying health state, unspecified: Secondary | ICD-10-CM

## 2015-10-31 DIAGNOSIS — Z7982 Long term (current) use of aspirin: Secondary | ICD-10-CM

## 2015-10-31 DIAGNOSIS — M4316 Spondylolisthesis, lumbar region: Secondary | ICD-10-CM | POA: Diagnosis present

## 2015-10-31 DIAGNOSIS — M5136 Other intervertebral disc degeneration, lumbar region: Principal | ICD-10-CM | POA: Diagnosis present

## 2015-10-31 DIAGNOSIS — Z9842 Cataract extraction status, left eye: Secondary | ICD-10-CM | POA: Diagnosis not present

## 2015-10-31 DIAGNOSIS — E039 Hypothyroidism, unspecified: Secondary | ICD-10-CM | POA: Diagnosis present

## 2015-10-31 DIAGNOSIS — M5416 Radiculopathy, lumbar region: Secondary | ICD-10-CM | POA: Diagnosis present

## 2015-10-31 DIAGNOSIS — I10 Essential (primary) hypertension: Secondary | ICD-10-CM | POA: Diagnosis present

## 2015-10-31 DIAGNOSIS — E785 Hyperlipidemia, unspecified: Secondary | ICD-10-CM | POA: Diagnosis present

## 2015-10-31 DIAGNOSIS — Z823 Family history of stroke: Secondary | ICD-10-CM | POA: Diagnosis not present

## 2015-10-31 DIAGNOSIS — M4326 Fusion of spine, lumbar region: Secondary | ICD-10-CM | POA: Diagnosis not present

## 2015-10-31 SURGERY — POSTERIOR LUMBAR FUSION 1 LEVEL
Anesthesia: General | Site: Spine Lumbar

## 2015-10-31 MED ORDER — HYDROMORPHONE HCL 1 MG/ML IJ SOLN
INTRAMUSCULAR | Status: AC
  Administered 2015-10-31: 0.5 mg via INTRAVENOUS
  Filled 2015-10-31: qty 1

## 2015-10-31 MED ORDER — ACETAMINOPHEN 325 MG PO TABS: 650.0000 mg | ORAL_TABLET | ORAL | Status: DC | PRN

## 2015-10-31 MED ORDER — VANCOMYCIN HCL 1000 MG IV SOLR
INTRAVENOUS | Status: AC
  Filled 2015-10-31: qty 1000

## 2015-10-31 MED ORDER — THROMBIN 20000 UNITS EX SOLR
CUTANEOUS | Status: AC
  Filled 2015-10-31: qty 20000

## 2015-10-31 MED ORDER — THROMBIN 20000 UNITS EX SOLR
CUTANEOUS | Status: DC | PRN
  Administered 2015-10-31: 20 mL via TOPICAL

## 2015-10-31 MED ORDER — METOCLOPRAMIDE HCL 5 MG/ML IJ SOLN: 10.0000 mg | Freq: Once | INTRAMUSCULAR | Status: DC | PRN

## 2015-10-31 MED ORDER — MORPHINE SULFATE (PF) 2 MG/ML IV SOLN
1.0000 mg | INTRAVENOUS | Status: DC | PRN
  Filled 2015-10-31: qty 1

## 2015-10-31 MED ORDER — ALPRAZOLAM 0.25 MG PO TABS
0.2500 mg | ORAL_TABLET | Freq: Three times a day (TID) | ORAL | Status: DC | PRN
  Administered 2015-10-31 – 2015-11-01 (×2): 0.5 mg via ORAL
  Filled 2015-10-31 (×2): qty 2

## 2015-10-31 MED ORDER — ONDANSETRON 4 MG PO TBDP
4.0000 mg | ORAL_TABLET | Freq: Three times a day (TID) | ORAL | Status: DC | PRN
  Filled 2015-10-31: qty 1

## 2015-10-31 MED ORDER — PROPOFOL 10 MG/ML IV BOLUS
INTRAVENOUS | Status: DC | PRN
  Administered 2015-10-31: 100 mg via INTRAVENOUS

## 2015-10-31 MED ORDER — DOCUSATE SODIUM 100 MG PO CAPS
100.0000 mg | ORAL_CAPSULE | Freq: Two times a day (BID) | ORAL | Status: DC
  Administered 2015-10-31 – 2015-11-01 (×3): 100 mg via ORAL
  Filled 2015-10-31 (×3): qty 1

## 2015-10-31 MED ORDER — SIMVASTATIN 20 MG PO TABS
20.0000 mg | ORAL_TABLET | Freq: Every day | ORAL | Status: DC
  Administered 2015-10-31: 20 mg via ORAL
  Filled 2015-10-31: qty 1

## 2015-10-31 MED ORDER — FENTANYL CITRATE (PF) 100 MCG/2ML IJ SOLN
INTRAMUSCULAR | Status: AC
  Filled 2015-10-31: qty 4

## 2015-10-31 MED ORDER — HYDROMORPHONE HCL 1 MG/ML IJ SOLN
0.2500 mg | INTRAMUSCULAR | Status: DC | PRN
  Administered 2015-10-31 (×3): 0.5 mg via INTRAVENOUS

## 2015-10-31 MED ORDER — DEXAMETHASONE SODIUM PHOSPHATE 10 MG/ML IJ SOLN
INTRAMUSCULAR | Status: AC
  Filled 2015-10-31: qty 1

## 2015-10-31 MED ORDER — HYDROCODONE-ACETAMINOPHEN 5-325 MG PO TABS: 1.0000 | ORAL_TABLET | ORAL | Status: DC | PRN

## 2015-10-31 MED ORDER — CEFAZOLIN SODIUM-DEXTROSE 2-3 GM-% IV SOLR
INTRAVENOUS | Status: DC | PRN
  Administered 2015-10-31: 2 g via INTRAVENOUS

## 2015-10-31 MED ORDER — VANCOMYCIN HCL 1000 MG IV SOLR
INTRAVENOUS | Status: DC | PRN
  Administered 2015-10-31: 1000 mg via TOPICAL

## 2015-10-31 MED ORDER — MIDAZOLAM HCL 2 MG/2ML IJ SOLN
INTRAMUSCULAR | Status: AC
  Filled 2015-10-31: qty 2

## 2015-10-31 MED ORDER — OXYCODONE-ACETAMINOPHEN 5-325 MG PO TABS
1.0000 | ORAL_TABLET | ORAL | Status: DC | PRN
  Administered 2015-10-31 – 2015-11-01 (×8): 2 via ORAL
  Filled 2015-10-31 (×7): qty 2

## 2015-10-31 MED ORDER — MENTHOL 3 MG MT LOZG: 1.0000 | LOZENGE | OROMUCOSAL | Status: DC | PRN

## 2015-10-31 MED ORDER — CEFAZOLIN SODIUM-DEXTROSE 2-4 GM/100ML-% IV SOLN
2.0000 g | Freq: Three times a day (TID) | INTRAVENOUS | Status: AC
Start: 2015-10-31 — End: 2015-10-31
  Administered 2015-10-31 (×2): 2 g via INTRAVENOUS
  Filled 2015-10-31 (×2): qty 100

## 2015-10-31 MED ORDER — BISACODYL 10 MG RE SUPP: 10.0000 mg | Freq: Every day | RECTAL | Status: DC | PRN

## 2015-10-31 MED ORDER — AMLODIPINE BESYLATE 5 MG PO TABS
5.0000 mg | ORAL_TABLET | Freq: Every day | ORAL | Status: DC
  Administered 2015-11-01: 5 mg via ORAL
  Filled 2015-10-31: qty 1

## 2015-10-31 MED ORDER — ONDANSETRON HCL 4 MG/2ML IJ SOLN: 4.0000 mg | INTRAMUSCULAR | Status: DC | PRN

## 2015-10-31 MED ORDER — MIDAZOLAM HCL 5 MG/5ML IJ SOLN
INTRAMUSCULAR | Status: DC | PRN
  Administered 2015-10-31: 2 mg via INTRAVENOUS

## 2015-10-31 MED ORDER — BACITRACIN ZINC 500 UNIT/GM EX OINT
TOPICAL_OINTMENT | CUTANEOUS | Status: AC
  Filled 2015-10-31: qty 28.35

## 2015-10-31 MED ORDER — LIDOCAINE 2% (20 MG/ML) 5 ML SYRINGE
INTRAMUSCULAR | Status: DC | PRN
  Administered 2015-10-31: 80 mg via INTRAVENOUS

## 2015-10-31 MED ORDER — 0.9 % SODIUM CHLORIDE (POUR BTL) OPTIME
TOPICAL | Status: DC | PRN
  Administered 2015-10-31: 1000 mL

## 2015-10-31 MED ORDER — SUGAMMADEX SODIUM 200 MG/2ML IV SOLN
INTRAVENOUS | Status: AC
  Filled 2015-10-31: qty 2

## 2015-10-31 MED ORDER — CYCLOBENZAPRINE HCL 5 MG PO TABS
5.0000 mg | ORAL_TABLET | Freq: Three times a day (TID) | ORAL | Status: DC | PRN
  Administered 2015-10-31 – 2015-11-01 (×2): 5 mg via ORAL
  Filled 2015-10-31 (×2): qty 1

## 2015-10-31 MED ORDER — ROCURONIUM BROMIDE 10 MG/ML (PF) SYRINGE
PREFILLED_SYRINGE | INTRAVENOUS | Status: AC
  Filled 2015-10-31: qty 10

## 2015-10-31 MED ORDER — FENTANYL CITRATE (PF) 100 MCG/2ML IJ SOLN
INTRAMUSCULAR | Status: DC | PRN
  Administered 2015-10-31 (×4): 50 ug via INTRAVENOUS

## 2015-10-31 MED ORDER — DEXAMETHASONE SODIUM PHOSPHATE 10 MG/ML IJ SOLN
INTRAMUSCULAR | Status: DC | PRN
  Administered 2015-10-31: 10 mg via INTRAVENOUS

## 2015-10-31 MED ORDER — ACETAMINOPHEN 650 MG RE SUPP: 650.0000 mg | RECTAL | Status: DC | PRN

## 2015-10-31 MED ORDER — ONDANSETRON HCL 4 MG/2ML IJ SOLN
INTRAMUSCULAR | Status: AC
  Filled 2015-10-31: qty 2

## 2015-10-31 MED ORDER — LACTATED RINGERS IV SOLN
INTRAVENOUS | Status: DC | PRN
  Administered 2015-10-31: 07:00:00 via INTRAVENOUS

## 2015-10-31 MED ORDER — LIDOCAINE 2% (20 MG/ML) 5 ML SYRINGE
INTRAMUSCULAR | Status: AC
  Filled 2015-10-31: qty 5

## 2015-10-31 MED ORDER — BACITRACIN ZINC 500 UNIT/GM EX OINT
TOPICAL_OINTMENT | CUTANEOUS | Status: DC | PRN
  Administered 2015-10-31: 1 via TOPICAL

## 2015-10-31 MED ORDER — CEFAZOLIN SODIUM-DEXTROSE 2-4 GM/100ML-% IV SOLN
INTRAVENOUS | Status: AC
  Filled 2015-10-31: qty 100

## 2015-10-31 MED ORDER — PHENYLEPHRINE HCL 10 MG/ML IJ SOLN
INTRAVENOUS | Status: DC | PRN
  Administered 2015-10-31: 15 ug/min via INTRAVENOUS

## 2015-10-31 MED ORDER — BUPIVACAINE-EPINEPHRINE (PF) 0.5% -1:200000 IJ SOLN
INTRAMUSCULAR | Status: AC
  Filled 2015-10-31: qty 30

## 2015-10-31 MED ORDER — SUGAMMADEX SODIUM 200 MG/2ML IV SOLN
INTRAVENOUS | Status: DC | PRN
  Administered 2015-10-31: 200 mg via INTRAVENOUS

## 2015-10-31 MED ORDER — ROCURONIUM BROMIDE 10 MG/ML (PF) SYRINGE
PREFILLED_SYRINGE | INTRAVENOUS | Status: DC | PRN
  Administered 2015-10-31: 60 mg via INTRAVENOUS
  Administered 2015-10-31: 40 mg via INTRAVENOUS

## 2015-10-31 MED ORDER — ONDANSETRON HCL 4 MG/2ML IJ SOLN
INTRAMUSCULAR | Status: DC | PRN
  Administered 2015-10-31: 4 mg via INTRAVENOUS

## 2015-10-31 MED ORDER — ALUM & MAG HYDROXIDE-SIMETH 200-200-20 MG/5ML PO SUSP: 30.0000 mL | Freq: Four times a day (QID) | ORAL | Status: DC | PRN

## 2015-10-31 MED ORDER — LACTATED RINGERS IV SOLN: INTRAVENOUS | Status: DC

## 2015-10-31 MED ORDER — MEPERIDINE HCL 25 MG/ML IJ SOLN: 6.2500 mg | INTRAMUSCULAR | Status: DC | PRN

## 2015-10-31 MED ORDER — PROPOFOL 10 MG/ML IV BOLUS
INTRAVENOUS | Status: AC
  Filled 2015-10-31: qty 20

## 2015-10-31 MED ORDER — OXYCODONE-ACETAMINOPHEN 5-325 MG PO TABS
ORAL_TABLET | ORAL | Status: AC
  Filled 2015-10-31: qty 2

## 2015-10-31 MED ORDER — BUPIVACAINE LIPOSOME 1.3 % IJ SUSP
20.0000 mL | INTRAMUSCULAR | Status: AC
  Administered 2015-10-31: 20 mL
  Filled 2015-10-31: qty 20

## 2015-10-31 MED ORDER — PHENOL 1.4 % MT LIQD: 1.0000 | OROMUCOSAL | Status: DC | PRN

## 2015-10-31 MED ORDER — SODIUM CHLORIDE 0.9 % IR SOLN
Status: DC | PRN
  Administered 2015-10-31: 500 mL

## 2015-10-31 MED ORDER — BUPIVACAINE-EPINEPHRINE (PF) 0.5% -1:200000 IJ SOLN
INTRAMUSCULAR | Status: DC | PRN
  Administered 2015-10-31: 10 mL

## 2015-10-31 MED ORDER — LEVOTHYROXINE SODIUM 100 MCG PO TABS
50.0000 ug | ORAL_TABLET | Freq: Every day | ORAL | Status: DC
  Administered 2015-11-01: 50 ug via ORAL
  Filled 2015-10-31: qty 1

## 2015-10-31 SURGICAL SUPPLY — 65 items
APL SKNCLS STERI-STRIP NONHPOA (GAUZE/BANDAGES/DRESSINGS) ×1
BAG DECANTER FOR FLEXI CONT (MISCELLANEOUS) ×3 IMPLANT
BENZOIN TINCTURE PRP APPL 2/3 (GAUZE/BANDAGES/DRESSINGS) ×3 IMPLANT
BLADE CLIPPER SURG (BLADE) IMPLANT
BUR MATCHSTICK NEURO 3.0 LAGG (BURR) ×3 IMPLANT
BUR PRECISION FLUTE 6.0 (BURR) ×3 IMPLANT
CAGE ALTERA 10X31X9-13 15D (Cage) ×2 IMPLANT
CAGE ALTERA 9-13-15-31MM (Cage) ×1 IMPLANT
CANISTER SUCT 3000ML PPV (MISCELLANEOUS) ×3 IMPLANT
CAP REVERE LOCKING (Cap) ×8 IMPLANT
CLOSURE WOUND 1/2 X4 (GAUZE/BANDAGES/DRESSINGS) ×1
CONT SPEC 4OZ CLIKSEAL STRL BL (MISCELLANEOUS) ×3 IMPLANT
COVER BACK TABLE 60X90IN (DRAPES) ×3 IMPLANT
COVER TABLE BACK 60X90 (DRAPES) ×3 IMPLANT
DRAPE C-ARM 42X72 X-RAY (DRAPES) ×6 IMPLANT
DRAPE HALF SHEET 40X57 (DRAPES) ×3 IMPLANT
DRAPE LAPAROTOMY 100X72X124 (DRAPES) ×3 IMPLANT
DRAPE POUCH INSTRU U-SHP 10X18 (DRAPES) ×3 IMPLANT
DRAPE SURG 17X23 STRL (DRAPES) ×12 IMPLANT
ELECT BLADE 4.0 EZ CLEAN MEGAD (MISCELLANEOUS) ×3
ELECT REM PT RETURN 9FT ADLT (ELECTROSURGICAL) ×3
ELECTRODE BLDE 4.0 EZ CLN MEGD (MISCELLANEOUS) ×1 IMPLANT
ELECTRODE REM PT RTRN 9FT ADLT (ELECTROSURGICAL) ×1 IMPLANT
EVACUATOR 1/8 PVC DRAIN (DRAIN) IMPLANT
GAUZE SPONGE 4X4 12PLY STRL (GAUZE/BANDAGES/DRESSINGS) ×3 IMPLANT
GAUZE SPONGE 4X4 16PLY XRAY LF (GAUZE/BANDAGES/DRESSINGS) IMPLANT
GLOVE BIO SURGEON STRL SZ8 (GLOVE) ×9 IMPLANT
GLOVE BIO SURGEON STRL SZ8.5 (GLOVE) ×6 IMPLANT
GLOVE BIOGEL PI IND STRL 7.0 (GLOVE) IMPLANT
GLOVE BIOGEL PI IND STRL 8.5 (GLOVE) ×1 IMPLANT
GLOVE BIOGEL PI INDICATOR 7.0 (GLOVE) ×6
GLOVE BIOGEL PI INDICATOR 8.5 (GLOVE) ×2
GLOVE EXAM NITRILE LRG STRL (GLOVE) IMPLANT
GLOVE EXAM NITRILE XL STR (GLOVE) IMPLANT
GLOVE EXAM NITRILE XS STR PU (GLOVE) IMPLANT
GLOVE SURG SS PI 6.5 STRL IVOR (GLOVE) ×4 IMPLANT
GOWN STRL REUS W/ TWL LRG LVL3 (GOWN DISPOSABLE) ×1 IMPLANT
GOWN STRL REUS W/ TWL XL LVL3 (GOWN DISPOSABLE) ×3 IMPLANT
GOWN STRL REUS W/TWL 2XL LVL3 (GOWN DISPOSABLE) IMPLANT
GOWN STRL REUS W/TWL LRG LVL3 (GOWN DISPOSABLE) ×3
GOWN STRL REUS W/TWL XL LVL3 (GOWN DISPOSABLE) ×9
KIT BASIN OR (CUSTOM PROCEDURE TRAY) ×3 IMPLANT
KIT ROOM TURNOVER OR (KITS) ×3 IMPLANT
NEEDLE HYPO 21X1.5 SAFETY (NEEDLE) ×3 IMPLANT
NEEDLE HYPO 22GX1.5 SAFETY (NEEDLE) ×3 IMPLANT
NS IRRIG 1000ML POUR BTL (IV SOLUTION) ×3 IMPLANT
PACK LAMINECTOMY NEURO (CUSTOM PROCEDURE TRAY) ×3 IMPLANT
PAD ARMBOARD 7.5X6 YLW CONV (MISCELLANEOUS) ×15 IMPLANT
PATTIES SURGICAL .5 X1 (DISPOSABLE) IMPLANT
ROD REVERE 6.35 45MM (Rod) ×4 IMPLANT
SCREW 7.5X45MM (Screw) ×4 IMPLANT
SCREW 7.5X50MM (Screw) ×6 IMPLANT
SPONGE LAP 4X18 X RAY DECT (DISPOSABLE) IMPLANT
SPONGE NEURO XRAY DETECT 1X3 (DISPOSABLE) IMPLANT
SPONGE SURGIFOAM ABS GEL 100 (HEMOSTASIS) ×3 IMPLANT
STRIP BIOACTIVE 20CC 25X100X8 (Miscellaneous) ×3 IMPLANT
STRIP CLOSURE SKIN 1/2X4 (GAUZE/BANDAGES/DRESSINGS) ×2 IMPLANT
SUT VIC AB 1 CT1 18XBRD ANBCTR (SUTURE) ×2 IMPLANT
SUT VIC AB 1 CT1 8-18 (SUTURE) ×6
SUT VIC AB 2-0 CP2 18 (SUTURE) ×6 IMPLANT
TAPE CLOTH SURG 4X10 WHT LF (GAUZE/BANDAGES/DRESSINGS) ×2 IMPLANT
TOWEL OR 17X24 6PK STRL BLUE (TOWEL DISPOSABLE) ×3 IMPLANT
TOWEL OR 17X26 10 PK STRL BLUE (TOWEL DISPOSABLE) ×3 IMPLANT
TRAY FOLEY W/METER SILVER 16FR (SET/KITS/TRAYS/PACK) ×3 IMPLANT
WATER STERILE IRR 1000ML POUR (IV SOLUTION) ×3 IMPLANT

## 2015-10-31 NOTE — Transfer of Care (Signed)
Immediate Anesthesia Transfer of Care Note  Patient: Julie Farrell  Procedure(s) Performed: Procedure(s): POSTERIOR LUMBAR INTERBODY FUSION, INTERBODY PROSTHESIS, POSTERIOR NON-SEGMENTAL INSTRUMENTATION,POSTERIOR LATERAL ARTHRODESIS, LUMBAR FOUR-FIVE (N/A)  Patient Location: PACU  Anesthesia Type:General  Level of Consciousness: awake, alert  and oriented  Airway & Oxygen Therapy: Patient Spontanous Breathing and Patient connected to nasal cannula oxygen  Post-op Assessment: Report given to RN, Post -op Vital signs reviewed and stable and Patient moving all extremities X 4  Post vital signs: Reviewed and stable  Last Vitals:  Vitals:   10/31/15 0705  BP: (!) 141/61  Pulse: 70  Resp: 18  Temp: 36.9 C    Last Pain:  Vitals:   10/31/15 0705  TempSrc: Oral  PainSc:       Patients Stated Pain Goal: 6 (10/31/15 0654)  Complications: No apparent anesthesia complications

## 2015-10-31 NOTE — H&P (Signed)
Subjective: The patient is a 70 year old white female who has complained of back, buttock and leg pain consistent with neurogenic claudication. She has failed medical management and was worked up with lumbar x-rays in the lumbar MRI. This demonstrated an L4-5 spondylolisthesis with spinal stenosis. I discussed the various treatment option with the patient. She has decided to proceed with surgery.   Past Medical History:  Diagnosis Date  . Abnormal pap   . Anxiety   . DDD (degenerative disc disease), lumbar   . Hemorrhoids   . HPV test positive 05/17/2013  . Hyperlipidemia   . Hypertension   . Hypothyroidism   . Thyroid disease     Past Surgical History:  Procedure Laterality Date  . CATARACT EXTRACTION W/PHACO Left 05/16/2012   Procedure: CATARACT EXTRACTION PHACO AND INTRAOCULAR LENS PLACEMENT (IOC);  Surgeon: Gemma Payor, MD;  Location: [facility];  Service: Ophthalmology;  Laterality: Left;  CDE:15.20  . COLONOSCOPY  2009   [facility]-Dr. Karilyn Cota  . EYE SURGERY Bilateral 14,15   cataracts  . Fatty Tissue Excision Left 2008   [facility]-Dr. Hilda Lias arm  . right ankle Right    fx    Allergies  Allergen Reactions  . No Known Allergies   . Vicodin [Hydrocodone-Acetaminophen] Other (See Comments)    Upset stomach    Social History  Substance Use Topics  . Smoking status: Never Smoker  . Smokeless tobacco: Never Used  . Alcohol use No    Family History  Problem Relation Age of Onset  . Heart attack Mother   . Heart disease Mother     CAD  . Heart disease Brother   . Heart disease Brother   . Hypertension Father   . Stroke Father    Prior to Admission medications   Medication Sig Start Date End Date Taking? Authorizing Provider  ALPRAZolam Prudy Feeler) 0.5 MG tablet Take 0.25-0.5 mg by mouth 3 (three) times daily as needed for anxiety.  10/08/15  Yes Historical Provider, MD  amLODipine (NORVASC) 5 MG tablet Take 5 mg by mouth daily.  01/26/15  Yes Historical Provider, MD  aspirin EC 81 MG  tablet Take 81 mg by mouth daily.   Yes Historical Provider, MD  levothyroxine (SYNTHROID, LEVOTHROID) 50 MCG tablet Take 50 mcg by mouth daily. 06/06/14  Yes Historical Provider, MD  ondansetron (ZOFRAN ODT) 4 MG disintegrating tablet Take 1 tablet (4 mg total) by mouth every 8 (eight) hours as needed for nausea or vomiting. 10/29/15  Yes Gerhard Munch, MD  oxyCODONE-acetaminophen (PERCOCET/ROXICET) 5-325 MG tablet 1 or 2 tabs PO q12h prn pain Patient taking differently: Take 1 tablet by mouth every 12 (twelve) hours as needed for moderate pain.  09/09/15  Yes Samuel Jester, DO  simvastatin (ZOCOR) 20 MG tablet Take 20 mg by mouth at bedtime.   Yes Historical Provider, MD  predniSONE (DELTASONE) 20 MG tablet Take 3 po QD x 3d , then 2 po QD x 3d then 1 po QD x 3d Patient not taking: Reported on 10/16/2015 09/09/15   Devoria Albe, MD     Review of Systems  Positive ROS: As above  All other systems have been reviewed and were otherwise negative with the exception of those mentioned in the HPI and as above.  Objective: Vital signs in last 24 hours: Temp:  [98.4 F (36.9 C)] 98.4 F (36.9 C) (10/12 0705) Pulse Rate:  [70] 70 (10/12 0705) Resp:  [18] 18 (10/12 0705) BP: (141)/(61) 141/61 (10/12 0705) SpO2:  [100 %]  100 % (10/12 0705) Weight:  [52.2 kg (115 lb)] 52.2 kg (115 lb) (10/12 0654)  General Appearance: Alert, cooperative, no distress, Head: Normocephalic, without obvious abnormality, atraumatic Eyes: PERRL, conjunctiva/corneas clear, EOM's intact,    Ears: Normal  Throat: Normal  Neck: Supple, symmetrical, trachea midline, no adenopathy; thyroid: No enlargement/tenderness/nodules; no carotid bruit or JVD Back: Symmetric, no curvature, ROM normal, no CVA tenderness Lungs: Clear to auscultation bilaterally, respirations unlabored Heart: Regular rate and rhythm, no murmur, rub or gallop Abdomen: Soft, non-tender,, no masses, no organomegaly Extremities: Extremities normal,  atraumatic, no cyanosis or edema Pulses: 2+ and symmetric all extremities Skin: Skin color, texture, turgor normal, no rashes or lesions  NEUROLOGIC:   Mental status: alert and oriented, no aphasia, good attention span, Fund of knowledge/ memory ok Motor Exam - grossly normal Sensory Exam - grossly normal Reflexes:  Coordination - grossly normal Gait - grossly normal Balance - grossly normal Cranial Nerves: I: smell Not tested  II: visual acuity  OS: Normal  OD: Normal   II: visual fields Full to confrontation  II: pupils Equal, round, reactive to light  III,VII: ptosis None  III,IV,VI: extraocular muscles  Full ROM  V: mastication Normal  V: facial light touch sensation  Normal  V,VII: corneal reflex  Present  VII: facial muscle function - upper  Normal  VII: facial muscle function - lower Normal  VIII: hearing Not tested  IX: soft palate elevation  Normal  IX,X: gag reflex Present  XI: trapezius strength  5/5  XI: sternocleidomastoid strength 5/5  XI: neck flexion strength  5/5  XII: tongue strength  Normal    Data Review Lab Results  Component Value Date   WBC 7.8 10/29/2015   HGB 13.8 10/29/2015   HCT 40.5 10/29/2015   MCV 91.6 10/29/2015   PLT 286 10/29/2015   Lab Results  Component Value Date   NA 137 10/29/2015   K 3.6 10/29/2015   CL 103 10/29/2015   CO2 27 10/29/2015   BUN 9 10/29/2015   CREATININE 0.81 10/29/2015   GLUCOSE 154 (H) 10/29/2015   No results found for: INR, PROTIME  Assessment/Plan: L4-5 spondylolisthesis, spinal stenosis, lumbar radiculopathy, neurogenic claudication: I discussed the situation with the patient and her family. I reviewed her imaging studies with him and pointed out the abnormalities. We have discussed the various treatment options including surgery. I described the surgical treatment option of an L4-5 decompression, instrumentation, and fusion. I have shown her surgical models. We have discussed the risks, benefits,  alternatives, likely postoperative course, and likelihood of achieving her goals with surgery. I have answered all the patient's questions. She has decided to proceed with surgery.   Damarrion Mimbs D 10/31/2015 7:25 AM

## 2015-10-31 NOTE — Op Note (Signed)
Brief history: The patient is a 70 year old white female who has complained of back and leg pain consistent with neurogenic claudication. She has failed medical management and was worked up with a lumbar MRI and lumbar x-rays. These demonstrated an L4-5 spondylolisthesis with spinal stenosis. I discussed the situation with the patient and her family. She has weighed the risks, benefits, and alternatives to surgery and decided to proceed with an L4-5 decompression, instrumentation, and fusion.  Preoperative diagnosis: L4-5 spondylolisthesis, facet arthropathy, spinal stenosis compressing both the L4 and the L5 nerve roots; lumbago; lumbar radiculopathy ; Neurogenic claudication  Postoperative diagnosis:The same   Procedure: Bilateral L4-5 Laminotomy/foraminotomies to decompress the bilateral L4 and L5 nerve roots(the work required to do this was in addition to the work required to do the posterior lumbar interbody fusion because of the patient's spinal stenosis, facet arthropathy. Etc. requiring a wide decompression of the nerve roots.); L4-5 transforaminal lumbar interbody fusion with local morselized autograft bone and Kinnex graft extender; insertion of interbody prosthesis at L4-5 (globus peek interbody prosthesis); posterior nonsegmental instrumentation from L4 to L5 with globus titanium pedicle screws and rods; posterior lateral arthrodesis at L4-5 with local morselized autograft bone and Kinnex bone graft extender.  Surgeon: Dr. Delma OfficerJeff Karalyn Kadel  Asst.: Dr. Maeola HarmanJoseph Stern  Anesthesia: Gen. endotracheal  Estimated blood loss: 150 mL  Drains: None  Complications: None  Description of procedure: The patient was brought to the operating room by the anesthesia team. General endotracheal anesthesia was induced. The patient was turned to the prone position on the Wilson frame. The patient's lumbosacral region was then prepared with Betadine scrub and Betadine solution. Sterile drapes were applied.  I  then injected the area to be incised with Marcaine with epinephrine solution. I then used the scalpel to make a linear midline incision over the L4-5 interspace. I then used electrocautery to perform a bilateral subperiosteal dissection exposing the spinous process and lamina of L4 and L5. We then obtained intraoperative radiograph to confirm our location. We then inserted the Verstrac retractor to provide exposure.  I began the decompression by using the high speed drill to perform laminotomies at L4-5 bilaterally. We then used the Kerrison punches to widen the laminotomy and removed the ligamentum flavum at L4-5 bilaterally. We used the Kerrison punches to remove the medial facets at L4-5 l bilaterally. We performed wide foraminotomies about the bilateral L4 and L5 nerve roots completing the decompression.  We now turned our attention to the posterior lumbar interbody fusion. I used a scalpel to incise the intervertebral disc at L4-5 bilaterally. I then performed a partial intervertebral discectomy at L4-5 bilaterally using the pituitary forceps. We prepared the vertebral endplates at L4-5 bilaterally for the fusion by removing the soft tissues with the curettes. We then used the trial spacers to pick the appropriate sized interbody prosthesis. We prefilled his prosthesis with a combination of local morselized autograft bone that we obtained during the decompression as well as Kinnex bone graft extender. We inserted the prefilled prosthesis into the interspace at L4-5 from the left, we then expanded the prosthesis. There was a good snug fit of the prosthesis in the interspace. We then filled and the remainder of the intervertebral disc space with local morselized autograft bone and Kinnex. This completed the posterior lumbar interbody arthrodesis.  We now turned attention to the instrumentation. Under fluoroscopic guidance we cannulated the bilateral L4 and L5 pedicles with the bone probe. We then removed  the bone probe. We then tapped the  pedicle with a 6.5 millimeter tap. We then removed the tap. We probed inside the tapped pedicle with a ball probe to rule out cortical breaches. We then inserted a 7.5 x 45 and 50 millimeter pedicle screw into the L4 and L5 pedicles bilaterally under fluoroscopic guidance. We then palpated along the medial aspect of the pedicles to rule out cortical breaches. There were none. The nerve roots were not injured. We then connected the unilateral pedicle screws with a lordotic rod. We compressed the construct and secured the rod in place with the caps. We then tightened the caps appropriately. This completed the instrumentation from L4-5.  We now turned our attention to the posterior lateral arthrodesis at L4-5 bilaterally. We used the high-speed drill to decorticate the remainder of the facets, pars, transverse process at L4-5 bilaterally. We then applied a combination of local morselized autograft bone and Kinnex bone graft extender over these decorticated posterior lateral structures. This completed the posterior lateral arthrodesis.  We then obtained hemostasis using bipolar electrocautery. We irrigated the wound out with bacitracin solution. We inspected the thecal sac and nerve roots and noted they were well decompressed. We then removed the retractor. We placed vancomycin powder in the wound. We reapproximated patient's thoracolumbar fascia with interrupted #1 Vicryl suture. We reapproximated patient's subcutaneous tissue with interrupted 2-0 Vicryl suture. The reapproximated patient's skin with Steri-Strips and benzoin. The wound was then coated with bacitracin ointment. A sterile dressing was applied. The drapes were removed. The patient was subsequently returned to the supine position where they were extubated by the anesthesia team. He was then transported to the post anesthesia care unit in stable condition. All sponge instrument and needle counts were reportedly correct  at the end of this case.

## 2015-10-31 NOTE — Anesthesia Postprocedure Evaluation (Signed)
Anesthesia Post Note  Patient: Julie HaggisBonnie F Farrell  Procedure(s) Performed: Procedure(s) (LRB): POSTERIOR LUMBAR INTERBODY FUSION, INTERBODY PROSTHESIS, POSTERIOR NON-SEGMENTAL INSTRUMENTATION,POSTERIOR LATERAL ARTHRODESIS, LUMBAR FOUR-FIVE (N/A)  Patient location during evaluation: PACU Anesthesia Type: General Level of consciousness: awake and alert and oriented Pain management: pain level controlled Vital Signs Assessment: post-procedure vital signs reviewed and stable Respiratory status: spontaneous breathing, nonlabored ventilation, respiratory function stable and patient connected to nasal cannula oxygen Cardiovascular status: blood pressure returned to baseline and stable Postop Assessment: no signs of nausea or vomiting Anesthetic complications: no    Last Vitals:  Vitals:   10/31/15 1220 10/31/15 1235  BP: 131/65   Pulse: 90   Resp: (!) 9   Temp:  36.5 C    Last Pain:  Vitals:   10/31/15 1205  TempSrc:   PainSc: 9                  Klinton Candelas A.

## 2015-10-31 NOTE — Anesthesia Procedure Notes (Signed)
Procedure Name: Intubation Date/Time: 10/31/2015 7:41 AM Performed by: Quentin OreWALKER, Matvey Llanas E Pre-anesthesia Checklist: Patient identified, Emergency Drugs available, Suction available and Patient being monitored Patient Re-evaluated:Patient Re-evaluated prior to inductionOxygen Delivery Method: Circle system utilized Preoxygenation: Pre-oxygenation with 100% oxygen Intubation Type: IV induction Ventilation: Mask ventilation without difficulty Laryngoscope Size: Mac and 3 Grade View: Grade I Tube type: Oral Tube size: 7.0 mm Number of attempts: 1 Airway Equipment and Method: Patient positioned with wedge pillow and Stylet Placement Confirmation: ETT inserted through vocal cords under direct vision,  positive ETCO2 and breath sounds checked- equal and bilateral Secured at: 21 cm Tube secured with: Tape Dental Injury: Teeth and Oropharynx as per pre-operative assessment

## 2015-10-31 NOTE — Anesthesia Preprocedure Evaluation (Addendum)
Anesthesia Evaluation  Patient identified by MRN, date of birth, ID band Patient awake    Reviewed: Allergy & Precautions, NPO status , Patient's Chart, lab work & pertinent test results  Airway Mallampati: III       Dental  (+) Chipped, Teeth Intact,    Pulmonary neg pulmonary ROS,    Pulmonary exam normal breath sounds clear to auscultation       Cardiovascular hypertension, Pt. on medications  Rhythm:Regular Rate:Normal     Neuro/Psych Anxiety negative neurological ROS     GI/Hepatic negative GI ROS, Neg liver ROS,   Endo/Other  Hypothyroidism Hyperlipidemia  Renal/GU negative Renal ROS  negative genitourinary   Musculoskeletal  (+) Arthritis , Spondylolisthesis - Lumbar DDD lumbar spine   Abdominal   Peds  Hematology negative hematology ROS (+)   Anesthesia Other Findings   Reproductive/Obstetrics HPV                           Lab Results  Component Value Date   WBC 7.8 10/29/2015   HGB 13.8 10/29/2015   HCT 40.5 10/29/2015   MCV 91.6 10/29/2015   PLT 286 10/29/2015     Chemistry      Component Value Date/Time   NA 137 10/29/2015 1254   K 3.6 10/29/2015 1254   CL 103 10/29/2015 1254   CO2 27 10/29/2015 1254   BUN 9 10/29/2015 1254   CREATININE 0.81 10/29/2015 1254      Component Value Date/Time   CALCIUM 10.3 10/29/2015 1254   ALKPHOS 52 10/29/2015 1254   AST 24 10/29/2015 1254   ALT 19 10/29/2015 1254   BILITOT 0.5 10/29/2015 1254      Anesthesia Physical Anesthesia Plan  ASA: III  Anesthesia Plan: General   Post-op Pain Management:    Induction: Intravenous  Airway Management Planned: Oral ETT  Additional Equipment:   Intra-op Plan:   Post-operative Plan: Extubation in OR  Informed Consent: I have reviewed the patients History and Physical, chart, labs and discussed the procedure including the risks, benefits and alternatives for the proposed  anesthesia with the patient or authorized representative who has indicated his/her understanding and acceptance.   Dental advisory given  Plan Discussed with: Anesthesiologist, CRNA and Surgeon  Anesthesia Plan Comments:         Anesthesia Quick Evaluation

## 2015-11-01 LAB — CBC
HCT: 32.3 % — ABNORMAL LOW (ref 36.0–46.0)
Hemoglobin: 10.9 g/dL — ABNORMAL LOW (ref 12.0–15.0)
MCH: 30.5 pg (ref 26.0–34.0)
MCHC: 33.7 g/dL (ref 30.0–36.0)
MCV: 90.5 fL (ref 78.0–100.0)
PLATELETS: 207 10*3/uL (ref 150–400)
RBC: 3.57 MIL/uL — ABNORMAL LOW (ref 3.87–5.11)
RDW: 12.8 % (ref 11.5–15.5)
WBC: 9.4 10*3/uL (ref 4.0–10.5)

## 2015-11-01 LAB — BASIC METABOLIC PANEL
Anion gap: 6 (ref 5–15)
BUN: 7 mg/dL (ref 6–20)
CALCIUM: 9.7 mg/dL (ref 8.9–10.3)
CO2: 29 mmol/L (ref 22–32)
CREATININE: 1.09 mg/dL — AB (ref 0.44–1.00)
Chloride: 105 mmol/L (ref 101–111)
GFR calc Af Amer: 59 mL/min — ABNORMAL LOW (ref >60)
GFR calc non Af Amer: 51 mL/min — ABNORMAL LOW (ref >60)
GLUCOSE: 95 mg/dL (ref 65–99)
Potassium: 3.6 mmol/L (ref 3.5–5.1)
Sodium: 140 mmol/L (ref 135–145)

## 2015-11-01 MED ORDER — CYCLOBENZAPRINE HCL 5 MG PO TABS: 5.0000 mg | ORAL_TABLET | Freq: Three times a day (TID) | ORAL | 1 refills | Status: DC | PRN

## 2015-11-01 MED ORDER — DOCUSATE SODIUM 100 MG PO CAPS: 100.0000 mg | ORAL_CAPSULE | Freq: Two times a day (BID) | ORAL | 0 refills | Status: DC

## 2015-11-01 MED ORDER — OXYCODONE-ACETAMINOPHEN 5-325 MG PO TABS: 1.0000 | ORAL_TABLET | ORAL | 0 refills | Status: DC | PRN

## 2015-11-01 MED FILL — Heparin Sodium (Porcine) Inj 1000 Unit/ML: INTRAMUSCULAR | Qty: 30 | Status: AC

## 2015-11-01 MED FILL — Sodium Chloride IV Soln 0.9%: INTRAVENOUS | Qty: 1000 | Status: AC

## 2015-11-01 NOTE — Discharge Summary (Signed)
  Physician Discharge Summary  Patient ID: Julie Farrell MRN: [id number] DOB/AGE: 1945-09-07 70 y.o.  Admit date: 10/31/2015 Discharge date: 11/01/2015  Admission Diagnoses:L4-5 spondylolisthesis, facet arthropathy, spinal stenosis, lumbago, lumbar radiculopathy, neurogenic claudication  Discharge Diagnoses: The same Active Problems:   Spondylolisthesis of lumbar region   Discharged Condition: good  Hospital Course: I performed an L4-5 decompression, instrumentation, and fusion on the patient on 10/31/2015. The surgery went well.  The plan is to mobilize her. She may go home later on today. The patient, and her daughter, were given written and oral discharge instructions. All their questions were answered.  Consults: Physical therapy Significant Diagnostic Studies: None Treatments: L4-5 decompression, instrumentation, and fusion. Discharge Exam: Blood pressure (!) 111/57, pulse 82, temperature 98.1 F (36.7 C), temperature source Oral, resp. rate 18, height 5\' 2"  (1.575 m), weight 52.2 kg (115 lb), SpO2 100 %. The patient is alert and pleasant. Her strength is grossly normal in her lower extremities. She looks well.  Disposition: Home     Medication List    STOP taking these medications   ALPRAZolam 0.5 MG tablet Commonly known as:  XANAX   predniSONE 20 MG tablet Commonly known as:  DELTASONE     TAKE these medications   amLODipine 5 MG tablet Commonly known as:  NORVASC Take 5 mg by mouth daily.   aspirin EC 81 MG tablet Take 81 mg by mouth daily.   cyclobenzaprine 5 MG tablet Commonly known as:  FLEXERIL Take 1 tablet (5 mg total) by mouth 3 (three) times daily as needed for muscle spasms.   docusate sodium 100 MG capsule Commonly known as:  COLACE Take 1 capsule (100 mg total) by mouth 2 (two) times daily.   levothyroxine 50 MCG tablet Commonly known as:  SYNTHROID, LEVOTHROID Take 50 mcg by mouth daily.   ondansetron 4 MG disintegrating  tablet Commonly known as:  ZOFRAN ODT Take 1 tablet (4 mg total) by mouth every 8 (eight) hours as needed for nausea or vomiting.   oxyCODONE-acetaminophen 5-325 MG tablet Commonly known as:  PERCOCET/ROXICET Take 1-2 tablets by mouth every 4 (four) hours as needed for moderate pain. What changed:  how much to take  how to take this  when to take this  reasons to take this  additional instructions   simvastatin 20 MG tablet Commonly known as:  ZOCOR Take 20 mg by mouth at bedtime.        SignedCristi Loron: Rainbow Salman D 11/01/2015, 7:43 AM

## 2015-11-01 NOTE — Progress Notes (Signed)
Patient alert and oriented, mae's well, voiding adequate amount of urine, swallowing without difficulty, c/o mild pain and meds given prior to discharged for ride and discomfort. Patient discharged home with family. Script and discharged instructions given to patient. Patient and family stated understanding of instructions given. 

## 2015-11-01 NOTE — Evaluation (Signed)
Physical Therapy Evaluation Patient Details Name: Barbie HaggisBonnie F Reigle MRN: [id number] DOB: 11-08-1945 Today's Date: 11/01/2015   History of Present Illness  pt presents post L4-5 Lami and Fusion.  pt with hx of Anxiety, HTN, and R ankle fx.    Clinical Impression  Pt with rigidity throughout mobility and maintains a somewhat flexed posture.  Pt needs cues to facilitate movement at times as pt freezes during turns or transitions.  Pt with festinating style gait and needs cues for increasing step length and speed of gait.  Pt with somewhat flat expression throughout session and communication can be difficult to understand.  This seems to be baseline for pt and unclear if pt has followed up with her primary MD or a Neurologist for these symptoms.  Daughter present throughout session and will be primary caregiver for pt at home.  Feel pt will need OPPT for balance and strengthening once cleared by MD to do so.      Follow Up Recommendations Outpatient PT;Supervision/Assistance - 24 hour (OPPT for balance and strengthening in future)    Equipment Recommendations  None recommended by PT    Recommendations for Other Services       Precautions / Restrictions Precautions Precautions: Fall;Back Precaution Booklet Issued: No Precaution Comments: Reviewed back precautions.  RN to provide handout with D/C paperwork. Required Braces or Orthoses: Spinal Brace Spinal Brace: Lumbar corset;Applied in sitting position Restrictions Weight Bearing Restrictions: No      Mobility  Bed Mobility               General bed mobility comments: pt sitting EOB.  Transfers Overall transfer level: Needs assistance Equipment used: Rolling walker (2 wheeled) Transfers: Sit to/from Stand Sit to Stand: Min guard         General transfer comment: cues for UE use and scooting hips to EOB prior to coming to standing.    Ambulation/Gait Ambulation/Gait assistance: Min guard Ambulation Distance (Feet): 200  Feet Assistive device: Rolling walker (2 wheeled) Gait Pattern/deviations: Step-through pattern;Decreased stride length;Festinating;Trunk flexed     General Gait Details: pt with very short step length and with festinating gait pattern at any thresholds, turns, or doorways.  pt with very rigid posture during mobility and difficulty obtaining and maintaining more upright posture.  pt also with small freezing episodes during turns needing cues to A with continuing movement.    Stairs            Wheelchair Mobility    Modified Rankin (Stroke Patients Only)       Balance Overall balance assessment: Needs assistance Sitting-balance support: No upper extremity supported;Feet supported Sitting balance-Leahy Scale: Fair     Standing balance support: Bilateral upper extremity supported;Single extremity supported;During functional activity Standing balance-Leahy Scale: Poor                               Pertinent Vitals/Pain Pain Assessment: 0-10 Pain Score: 5  Pain Location: back Pain Descriptors / Indicators: Aching;Grimacing Pain Intervention(s): Monitored during session;Premedicated before session;Repositioned    Home Living Family/patient expects to be discharged to:: Private residence Living Arrangements: Children Available Help at Discharge: Family;Available 24 hours/day (Daughter to stay 1st week and others to help 2nd week) Type of Home: House Home Access: Stairs to enter Entrance Stairs-Rails: None Entrance Stairs-Number of Steps: 1 Home Layout: One level Home Equipment: Walker - 2 wheels;Bedside commode      Prior Function Level of Independence: Independent  Hand Dominance        Extremity/Trunk Assessment   Upper Extremity Assessment: Generalized weakness           Lower Extremity Assessment: Generalized weakness      Cervical / Trunk Assessment: Kyphotic  Communication   Communication:  (Difficult to  understand)  Cognition Arousal/Alertness: Awake/alert Behavior During Therapy: WFL for tasks assessed/performed Overall Cognitive Status: Within Functional Limits for tasks assessed                      General Comments      Exercises     Assessment/Plan    PT Assessment Patient needs continued PT services  PT Problem List Decreased strength;Decreased activity tolerance;Decreased balance;Decreased mobility;Decreased knowledge of use of DME;Decreased coordination;Decreased knowledge of precautions;Pain          PT Treatment Interventions DME instruction;Gait training;Stair training;Functional mobility training;Therapeutic activities;Therapeutic exercise;Balance training;Neuromuscular re-education;Patient/family education    PT Goals (Current goals can be found in the Care Plan section)  Acute Rehab PT Goals Patient Stated Goal: Home PT Goal Formulation: With patient/family Time For Goal Achievement: 11/08/15 Potential to Achieve Goals: Good    Frequency Min 5X/week   Barriers to discharge        Co-evaluation               End of Session Equipment Utilized During Treatment: Gait belt;Back brace Activity Tolerance: Patient tolerated treatment well Patient left: in chair;with call bell/phone within reach;with family/visitor present Nurse Communication: Mobility status         Time: 1610-9604 PT Time Calculation (min) (ACUTE ONLY): 23 min   Charges:   PT Evaluation $PT Eval Moderate Complexity: 1 Procedure PT Treatments $Gait Training: 8-22 mins   PT G CodesSunny Schlein, [place] [phone] 11/01/2015, 9:52 AM

## 2015-11-26 DIAGNOSIS — M5416 Radiculopathy, lumbar region: Secondary | ICD-10-CM | POA: Diagnosis not present

## 2015-12-18 DIAGNOSIS — H6122 Impacted cerumen, left ear: Secondary | ICD-10-CM | POA: Diagnosis not present

## 2015-12-18 DIAGNOSIS — Z1389 Encounter for screening for other disorder: Secondary | ICD-10-CM | POA: Diagnosis not present

## 2016-02-21 ENCOUNTER — Encounter: Payer: Self-pay | Admitting: Internal Medicine

## 2016-02-21 DIAGNOSIS — M4316 Spondylolisthesis, lumbar region: Secondary | ICD-10-CM | POA: Diagnosis not present

## 2016-02-21 DIAGNOSIS — I10 Essential (primary) hypertension: Secondary | ICD-10-CM | POA: Diagnosis not present

## 2016-02-21 DIAGNOSIS — Z681 Body mass index (BMI) 19 or less, adult: Secondary | ICD-10-CM | POA: Diagnosis not present

## 2016-02-25 DIAGNOSIS — Z681 Body mass index (BMI) 19 or less, adult: Secondary | ICD-10-CM | POA: Diagnosis not present

## 2016-02-25 DIAGNOSIS — R350 Frequency of micturition: Secondary | ICD-10-CM | POA: Diagnosis not present

## 2016-02-25 DIAGNOSIS — Z1389 Encounter for screening for other disorder: Secondary | ICD-10-CM | POA: Diagnosis not present

## 2016-02-25 DIAGNOSIS — E039 Hypothyroidism, unspecified: Secondary | ICD-10-CM | POA: Diagnosis not present

## 2016-03-05 ENCOUNTER — Other Ambulatory Visit: Payer: Self-pay | Admitting: Adult Health

## 2016-03-05 DIAGNOSIS — Z1231 Encounter for screening mammogram for malignant neoplasm of breast: Secondary | ICD-10-CM

## 2016-03-06 DIAGNOSIS — I10 Essential (primary) hypertension: Secondary | ICD-10-CM | POA: Diagnosis not present

## 2016-03-06 DIAGNOSIS — N390 Urinary tract infection, site not specified: Secondary | ICD-10-CM | POA: Diagnosis not present

## 2016-03-06 DIAGNOSIS — T50905D Adverse effect of unspecified drugs, medicaments and biological substances, subsequent encounter: Secondary | ICD-10-CM | POA: Diagnosis not present

## 2016-03-06 DIAGNOSIS — R5383 Other fatigue: Secondary | ICD-10-CM | POA: Diagnosis not present

## 2016-03-06 DIAGNOSIS — Z681 Body mass index (BMI) 19 or less, adult: Secondary | ICD-10-CM | POA: Diagnosis not present

## 2016-03-06 DIAGNOSIS — E063 Autoimmune thyroiditis: Secondary | ICD-10-CM | POA: Diagnosis not present

## 2016-03-06 DIAGNOSIS — E748 Other specified disorders of carbohydrate metabolism: Secondary | ICD-10-CM | POA: Diagnosis not present

## 2016-03-06 DIAGNOSIS — F329 Major depressive disorder, single episode, unspecified: Secondary | ICD-10-CM | POA: Diagnosis not present

## 2016-03-20 ENCOUNTER — Ambulatory Visit (INDEPENDENT_AMBULATORY_CARE_PROVIDER_SITE_OTHER): Payer: Medicare HMO | Admitting: Cardiovascular Disease

## 2016-03-20 ENCOUNTER — Encounter: Payer: Self-pay | Admitting: Cardiovascular Disease

## 2016-03-20 VITALS — BP 120/64 | HR 85 | Ht 62.0 in | Wt 106.0 lb

## 2016-03-20 DIAGNOSIS — R0609 Other forms of dyspnea: Secondary | ICD-10-CM

## 2016-03-20 DIAGNOSIS — R9431 Abnormal electrocardiogram [ECG] [EKG]: Secondary | ICD-10-CM

## 2016-03-20 DIAGNOSIS — R011 Cardiac murmur, unspecified: Secondary | ICD-10-CM | POA: Diagnosis not present

## 2016-03-20 NOTE — Patient Instructions (Addendum)
Your physician recommends that you schedule a follow-up appointment in: 3 weeks     Your physician has requested that you have an echocardiogram. Echocardiography is a painless test that uses sound waves to create images of your heart. It provides your doctor with information about the size and shape of your heart and how well your heart's chambers and valves are working. This procedure takes approximately one hour. There are no restrictions for this procedure.    Your physician has requested that you have a lexiscan myoview. For further information please visit [email or web address]. Please follow instruction sheet, as given.       Your physician recommends that you continue on your current medications as directed. Please refer to the Current Medication list given to you today.     Thank you for choosing [facility] !

## 2016-03-20 NOTE — Progress Notes (Signed)
CARDIOLOGY CONSULT NOTE  Patient ID: Julie Farrell MRN: [id number] DOB/AGE: [birth year] 71 y.o.  Admit date: (Not on file) [facility] Referring Physician: Sherwood GamblerFusco MD  Reason for Consultation: murmur  HPI: The patient is a 71 year old woman with a history of hypertension, hyperlipidemia, and hypothyroidism who is referred for the evaluation of a murmur.  She is here with her daughter, Aggie Cosierheresa.  For the past one month she has noticed increasing exertional dyspnea and fatigue with minimal exertion such as changing her clothes. She denies chest pain, orthopnea, and paroxysmal nocturnal dyspnea. She has felt left leg numbness and tingling down to her toes as well as left arm tingling and numbness. She has had some swelling of the left ankle. She underwent back surgery in October 2017. Afterwards she used to walk a lot but has been unable to do so in the past one month. She does complain of palpitations at rest but denies syncope.  She denies a history of smoking.   Review of labs performed on 03/06/16 showed hemoglobin 13.3, platelets 276, BUN 18, creatinine 0.93, sodium 140, potassium 3.8.  ECG performed in the office today demonstrates normal sinus rhythm with right bundle branch block and left anterior fascicular block.  Family history: One brother died of heart disease at age 71 and another brother died of heart disease at age 71. Mother died at age 71.    Allergies  Allergen Reactions  . No Known Allergies   . Vicodin [Hydrocodone-Acetaminophen] Other (See Comments)    Upset stomach    Current Outpatient Prescriptions  Medication Sig Dispense Refill  . amLODipine (NORVASC) 5 MG tablet Take 5 mg by mouth daily.   1  . aspirin EC 81 MG tablet Take 81 mg by mouth daily.    Marland Kitchen. levothyroxine (SYNTHROID, LEVOTHROID) 50 MCG tablet Take 50 mcg by mouth daily.  0  . simvastatin (ZOCOR) 20 MG tablet Take 20 mg by mouth at bedtime.     No  current facility-administered medications for this visit.     Past Medical History:  Diagnosis Date  . Abnormal pap   . Anxiety   . DDD (degenerative disc disease), lumbar   . Hemorrhoids   . HPV test positive 05/17/2013  . Hyperlipidemia   . Hypertension   . Hypothyroidism   . Thyroid disease     Past Surgical History:  Procedure Laterality Date  . CATARACT EXTRACTION W/PHACO Left 05/16/2012   Procedure: CATARACT EXTRACTION PHACO AND INTRAOCULAR LENS PLACEMENT (IOC);  Surgeon: Gemma PayorKerry Hunt, MD;  Location: [facility];  Service: Ophthalmology;  Laterality: Left;  CDE:15.20  . COLONOSCOPY  2009   [facility]-Dr. Karilyn Cotaehman  . EYE SURGERY Bilateral 14,15   cataracts  . Fatty Tissue Excision Left 2008   [facility]-Dr. Hilda LiasKeeling arm  . right ankle Right    fx    Social History   Social History  . Marital status: Divorced    Spouse name: N/A  . Number of children: N/A  . Years of education: N/A   Occupational History  . Not on file.   Social History Main Topics  . Smoking status: Never Smoker  . Smokeless tobacco: Never Used  . Alcohol use No  . Drug use: No  . Sexual activity: Not Currently    Birth control/ protection: Post-menopausal   Other Topics Concern  . Not on file   Social History Narrative  . No narrative on file  Prior to Admission medications   Medication Sig Start Date End Date Taking? Authorizing Provider  amLODipine (NORVASC) 5 MG tablet Take 5 mg by mouth daily.  01/26/15  Yes Historical Provider, MD  aspirin EC 81 MG tablet Take 81 mg by mouth daily.   Yes Historical Provider, MD  levothyroxine (SYNTHROID, LEVOTHROID) 50 MCG tablet Take 50 mcg by mouth daily. 06/06/14  Yes Historical Provider, MD  simvastatin (ZOCOR) 20 MG tablet Take 20 mg by mouth at bedtime.   Yes Historical Provider, MD     Review of systems complete and found to be negative unless listed above in HPI     Physical exam Blood pressure 120/64, pulse 85, height 5\' 2"  (1.575 m), weight 106  lb (48.1 kg), SpO2 99 %. General: NAD Neck: No JVD, no thyromegaly or thyroid nodule.  Lungs: Clear to auscultation bilaterally with normal respiratory effort. CV: Nondisplaced PMI. Regular rate and rhythm, normal S1/S2, no S3/S4, soft 1/6 systolic murmur loudest at RUSB but heard along LSB as well.  Trace left ankle edema.  No carotid bruit.   Abdomen: Soft, nontender, no distention.  Skin: Intact without lesions or rashes.  Neurologic: Alert and oriented x 3.  Psych: Anxious affect. Extremities: No clubbing or cyanosis.  HEENT: Normal.   ECG: Most recent ECG reviewed.  Telemetry: Independently reviewed.  Labs:   Lab Results  Component Value Date   WBC 9.4 11/01/2015   HGB 10.9 (L) 11/01/2015   HCT 32.3 (L) 11/01/2015   MCV 90.5 11/01/2015   PLT 207 11/01/2015   No results for input(s): NA, K, CL, CO2, BUN, CREATININE, CALCIUM, PROT, BILITOT, ALKPHOS, ALT, AST, GLUCOSE in the last 168 hours.  Invalid input(s): LABALBU No results found for: CKTOTAL, CKMB, CKMBINDEX, TROPONINI No results found for: CHOL No results found for: HDL No results found for: LDLCALC No results found for: TRIG No results found for: CHOLHDL No results found for: LDLDIRECT       Studies: No results found.  ASSESSMENT AND PLAN:  1. Exertional dyspnea and fatigue: Risk factors for ischemic heart disease include hypertension, hyperlipidemia, and family history of premature coronary artery disease. Bifascicular block noted on ECG. I will proceed with a nuclear myocardial perfusion imaging study to evaluate for ischemic heart disease (Lexiscan Myoview). I will order a 2-D echocardiogram with Doppler to evaluate cardiac structure, function, and regional wall motion.  2. Murmur: I will order a 2-D echocardiogram with Doppler to evaluate cardiac structure, function, and regional wall motion.   Dispo: fu 3 weeks   Signed: Prentice Docker, M.D., F.A.C.C.  03/20/2016, 9:31 AM

## 2016-03-25 ENCOUNTER — Inpatient Hospital Stay (HOSPITAL_COMMUNITY): Admission: RE | Admit: 2016-03-25 | Payer: Commercial Managed Care - HMO | Source: Ambulatory Visit

## 2016-03-25 ENCOUNTER — Telehealth: Payer: Self-pay | Admitting: Cardiology

## 2016-03-25 ENCOUNTER — Encounter (HOSPITAL_COMMUNITY): Payer: Self-pay

## 2016-03-25 ENCOUNTER — Ambulatory Visit (HOSPITAL_BASED_OUTPATIENT_CLINIC_OR_DEPARTMENT_OTHER)
Admission: RE | Admit: 2016-03-25 | Discharge: 2016-03-25 | Disposition: A | Discharge status: Home / Self Care | Payer: Medicare HMO | Source: Ambulatory Visit | Attending: Cardiovascular Disease | Admitting: Cardiovascular Disease

## 2016-03-25 ENCOUNTER — Encounter (HOSPITAL_COMMUNITY)
Admission: RE | Admit: 2016-03-25 | Discharge: 2016-03-25 | Disposition: A | Discharge status: Home / Self Care | Payer: Medicare HMO | Source: Ambulatory Visit | Attending: Cardiovascular Disease | Admitting: Cardiovascular Disease

## 2016-03-25 DIAGNOSIS — I352 Nonrheumatic aortic (valve) stenosis with insufficiency: Secondary | ICD-10-CM | POA: Insufficient documentation

## 2016-03-25 DIAGNOSIS — R011 Cardiac murmur, unspecified: Secondary | ICD-10-CM | POA: Diagnosis not present

## 2016-03-25 DIAGNOSIS — R9431 Abnormal electrocardiogram [ECG] [EKG]: Secondary | ICD-10-CM | POA: Insufficient documentation

## 2016-03-25 DIAGNOSIS — I510 Cardiac septal defect, acquired: Secondary | ICD-10-CM | POA: Insufficient documentation

## 2016-03-25 DIAGNOSIS — R0609 Other forms of dyspnea: Secondary | ICD-10-CM

## 2016-03-25 LAB — NM MYOCAR MULTI W/SPECT W/WALL MOTION / EF
LV dias vol: 49 mL (ref 46–106)
LV sys vol: 28 mL
Peak HR: 123 {beats}/min
RATE: 0.33
Rest HR: 90 {beats}/min
SDS: 1
SRS: 4
SSS: 5
TID: 1

## 2016-03-25 MED ORDER — TECHNETIUM TC 99M TETROFOSMIN IV KIT
30.0000 | PACK | Freq: Once | INTRAVENOUS | Status: AC | PRN
  Administered 2016-03-25: 31 via INTRAVENOUS

## 2016-03-25 MED ORDER — SODIUM CHLORIDE 0.9% FLUSH
INTRAVENOUS | Status: AC
  Filled 2016-03-25: qty 200

## 2016-03-25 MED ORDER — REGADENOSON 0.4 MG/5ML IV SOLN
INTRAVENOUS | Status: AC
  Administered 2016-03-25: 0.4 mg via INTRAVENOUS
  Filled 2016-03-25: qty 5

## 2016-03-25 MED ORDER — TECHNETIUM TC 99M TETROFOSMIN IV KIT
10.0000 | PACK | Freq: Once | INTRAVENOUS | Status: AC | PRN
  Administered 2016-03-25: 11 via INTRAVENOUS

## 2016-03-25 MED ORDER — SODIUM CHLORIDE 0.9% FLUSH
INTRAVENOUS | Status: AC
  Administered 2016-03-25: 10 mL via INTRAVENOUS
  Filled 2016-03-25: qty 10

## 2016-03-25 NOTE — Progress Notes (Signed)
*  PRELIMINARY RESULTS* Echocardiogram 2D Echocardiogram has been performed.  Julie Farrell, [name] 03/25/2016, 11:24 AM

## 2016-04-07 ENCOUNTER — Ambulatory Visit: Payer: Commercial Managed Care - HMO | Admitting: Cardiovascular Disease

## 2016-04-10 ENCOUNTER — Ambulatory Visit (INDEPENDENT_AMBULATORY_CARE_PROVIDER_SITE_OTHER): Payer: Medicare HMO | Admitting: Cardiovascular Disease

## 2016-04-10 ENCOUNTER — Encounter: Payer: Self-pay | Admitting: Cardiovascular Disease

## 2016-04-10 VITALS — BP 116/60 | HR 79 | Ht 62.0 in | Wt 105.0 lb

## 2016-04-10 DIAGNOSIS — R0609 Other forms of dyspnea: Secondary | ICD-10-CM | POA: Diagnosis not present

## 2016-04-10 DIAGNOSIS — I10 Essential (primary) hypertension: Secondary | ICD-10-CM | POA: Diagnosis not present

## 2016-04-10 DIAGNOSIS — I69328 Other speech and language deficits following cerebral infarction: Secondary | ICD-10-CM | POA: Diagnosis not present

## 2016-04-10 DIAGNOSIS — E7800 Pure hypercholesterolemia, unspecified: Secondary | ICD-10-CM | POA: Diagnosis not present

## 2016-04-10 DIAGNOSIS — I350 Nonrheumatic aortic (valve) stenosis: Secondary | ICD-10-CM

## 2016-04-10 NOTE — Patient Instructions (Signed)
Your physician recommends that you schedule a follow-up appointment in: 2-3 years, please have your daughter call us.     [facility] [phone], they should call you to schedule apt         Thank you for choosing  [facility] !

## 2016-04-10 NOTE — Progress Notes (Signed)
SUBJECTIVE: The patient returns for follow-up after undergoing cardiovascular testing performed for the evaluation of a murmur, exertional dyspnea, and fatigue.  Nuclear stress test 03/25/16: No myocardial scar or ischemia.  Echocardiogram showed normal left ventricular systolic function, LVEF 55-60%. There were wall motion abnormalities consistent with left bundle branch block. There was mild aortic stenosis.  She has had intermittent left leg swelling and discoloration of the toes (purplish color), with cold sensations in her toes.  Her daughter showed me pictures of this on her phone.  ABI's normal on 02/20/15.  Her daughter tells me that her mother has had his change in speech patterns over the last 12 months. There is some concern she has had a stroke.  The patient is back to walking and complains of left shortness of breath and fatigue.  Review of Systems: As per "subjective", otherwise negative.  Allergies  Allergen Reactions  . No Known Allergies   . Vicodin [Hydrocodone-Acetaminophen] Other (See Comments)    Upset stomach    Current Outpatient Prescriptions  Medication Sig Dispense Refill  . amLODipine (NORVASC) 5 MG tablet Take 5 mg by mouth daily.   1  . aspirin EC 81 MG tablet Take 81 mg by mouth daily.    Marland Kitchen. levothyroxine (SYNTHROID, LEVOTHROID) 50 MCG tablet Take 50 mcg by mouth daily.  0  . simvastatin (ZOCOR) 20 MG tablet Take 20 mg by mouth at bedtime.     No current facility-administered medications for this visit.     Past Medical History:  Diagnosis Date  . Abnormal pap   . Anxiety   . DDD (degenerative disc disease), lumbar   . Hemorrhoids   . HPV test positive 05/17/2013  . Hyperlipidemia   . Hypertension   . Hypothyroidism   . Thyroid disease     Past Surgical History:  Procedure Laterality Date  . CATARACT EXTRACTION W/PHACO Left 05/16/2012   Procedure: CATARACT EXTRACTION PHACO AND INTRAOCULAR LENS PLACEMENT (IOC);  Surgeon: Gemma PayorKerry Hunt,  MD;  Location: [facility];  Service: Ophthalmology;  Laterality: Left;  CDE:15.20  . COLONOSCOPY  2009   [facility]-Dr. Karilyn Cotaehman  . EYE SURGERY Bilateral 14,15   cataracts  . Fatty Tissue Excision Left 2008   [facility]-Dr. Hilda LiasKeeling arm  . right ankle Right    fx    Social History   Social History  . Marital status: Divorced    Spouse name: N/A  . Number of children: N/A  . Years of education: N/A   Occupational History  . Not on file.   Social History Main Topics  . Smoking status: Never Smoker  . Smokeless tobacco: Never Used  . Alcohol use No  . Drug use: No  . Sexual activity: Not Currently    Birth control/ protection: Post-menopausal   Other Topics Concern  . Not on file   Social History Narrative  . No narrative on file     Vitals:   04/10/16 1348  BP: 116/60  Pulse: 79  SpO2: 99%  Weight: 105 lb (47.6 kg)  Height: 5\' 2"  (1.575 m)    PHYSICAL EXAM General: NAD Neck: No JVD, no thyromegaly or thyroid nodule.  Lungs: Clear to auscultation bilaterally with normal respiratory effort. CV: Nondisplaced PMI. Regular rate and rhythm, normal S1/S2, no S3/S4, soft 1/6 systolic murmur loudest at RUSB but heard along LSB as well.  Trace left ankle edema.  No carotid bruit.   Abdomen: Soft, nontender, no distention.  Skin: Intact without lesions  or rashes.  Neurologic: Alert and oriented x 3.  Psych: Anxious affect. Extremities: No clubbing or cyanosis.  HEENT: Normal.     ECG: Most recent ECG reviewed.      ASSESSMENT AND PLAN: 1. Exertional dyspnea and fatigue: Symptoms have improved. Risk factors for ischemic heart disease include hypertension, hyperlipidemia, and family history of premature coronary artery disease. Nuclear stress test results reviewed above with no evidence of myocardial ischemia or scar. No further CV testing indicated at this time.  2. Aortic stenosis: Mild in severity. Will monitor with surveillance echocardiography as needed.  3. Hypertension:  Controlled. No changes.  4. Hyperlipidemia: Continue statin.  5. Possible CVA: Will make referral to neurology. Already on ASA and statin.  Dispo: fu 2-3 years   Prentice Docker, M.D., F.A.C.C.

## 2016-04-15 DIAGNOSIS — E063 Autoimmune thyroiditis: Secondary | ICD-10-CM | POA: Diagnosis not present

## 2016-04-15 DIAGNOSIS — R3129 Other microscopic hematuria: Secondary | ICD-10-CM | POA: Diagnosis not present

## 2016-04-15 DIAGNOSIS — R358 Other polyuria: Secondary | ICD-10-CM | POA: Diagnosis not present

## 2016-04-15 DIAGNOSIS — Z681 Body mass index (BMI) 19 or less, adult: Secondary | ICD-10-CM | POA: Diagnosis not present

## 2016-04-15 DIAGNOSIS — R471 Dysarthria and anarthria: Secondary | ICD-10-CM | POA: Diagnosis not present

## 2016-04-15 DIAGNOSIS — I10 Essential (primary) hypertension: Secondary | ICD-10-CM | POA: Diagnosis not present

## 2016-04-20 ENCOUNTER — Ambulatory Visit: Payer: Commercial Managed Care - HMO

## 2016-04-20 ENCOUNTER — Ambulatory Visit
Admission: RE | Admit: 2016-04-20 | Discharge: 2016-04-20 | Disposition: A | Discharge status: Home / Self Care | Payer: Medicare HMO | Source: Ambulatory Visit | Attending: Adult Health | Admitting: Adult Health

## 2016-04-20 DIAGNOSIS — Z1231 Encounter for screening mammogram for malignant neoplasm of breast: Secondary | ICD-10-CM

## 2016-05-01 DIAGNOSIS — M4316 Spondylolisthesis, lumbar region: Secondary | ICD-10-CM | POA: Diagnosis not present

## 2016-05-01 DIAGNOSIS — I10 Essential (primary) hypertension: Secondary | ICD-10-CM | POA: Diagnosis not present

## 2016-05-01 DIAGNOSIS — Z681 Body mass index (BMI) 19 or less, adult: Secondary | ICD-10-CM | POA: Diagnosis not present

## 2016-05-01 DIAGNOSIS — O8620 Urinary tract infection following delivery, unspecified: Secondary | ICD-10-CM | POA: Diagnosis not present

## 2016-05-01 DIAGNOSIS — N390 Urinary tract infection, site not specified: Secondary | ICD-10-CM | POA: Diagnosis not present

## 2016-05-01 DIAGNOSIS — N342 Other urethritis: Secondary | ICD-10-CM | POA: Diagnosis not present

## 2016-05-01 DIAGNOSIS — E063 Autoimmune thyroiditis: Secondary | ICD-10-CM | POA: Diagnosis not present

## 2016-05-05 ENCOUNTER — Ambulatory Visit: Payer: Commercial Managed Care - HMO | Admitting: Diagnostic Neuroimaging

## 2016-05-06 DIAGNOSIS — H5210 Myopia, unspecified eye: Secondary | ICD-10-CM | POA: Diagnosis not present

## 2016-05-09 ENCOUNTER — Encounter (HOSPITAL_COMMUNITY): Payer: Self-pay | Admitting: Emergency Medicine

## 2016-05-09 ENCOUNTER — Emergency Department (HOSPITAL_COMMUNITY)
Admission: EM | Admit: 2016-05-09 | Discharge: 2016-05-09 | Disposition: A | Discharge status: Home / Self Care | Payer: Medicare HMO | Attending: Emergency Medicine | Admitting: Emergency Medicine

## 2016-05-09 DIAGNOSIS — Z79899 Other long term (current) drug therapy: Secondary | ICD-10-CM | POA: Diagnosis not present

## 2016-05-09 DIAGNOSIS — R1030 Lower abdominal pain, unspecified: Secondary | ICD-10-CM | POA: Diagnosis not present

## 2016-05-09 DIAGNOSIS — R300 Dysuria: Secondary | ICD-10-CM | POA: Diagnosis not present

## 2016-05-09 DIAGNOSIS — E039 Hypothyroidism, unspecified: Secondary | ICD-10-CM | POA: Diagnosis not present

## 2016-05-09 DIAGNOSIS — I10 Essential (primary) hypertension: Secondary | ICD-10-CM | POA: Diagnosis not present

## 2016-05-09 DIAGNOSIS — Z7982 Long term (current) use of aspirin: Secondary | ICD-10-CM | POA: Insufficient documentation

## 2016-05-09 LAB — URINALYSIS, ROUTINE W REFLEX MICROSCOPIC
BACTERIA UA: NONE SEEN
Bilirubin Urine: NEGATIVE
Glucose, UA: NEGATIVE mg/dL
Ketones, ur: NEGATIVE mg/dL
Leukocytes, UA: NEGATIVE
Nitrite: NEGATIVE
PROTEIN: NEGATIVE mg/dL
SPECIFIC GRAVITY, URINE: 1.008 (ref 1.005–1.030)
SQUAMOUS EPITHELIAL / LPF: NONE SEEN
pH: 6 (ref 5.0–8.0)

## 2016-05-09 LAB — COMPREHENSIVE METABOLIC PANEL
ALK PHOS: 67 U/L (ref 38–126)
ALT: 15 U/L (ref 14–54)
AST: 20 U/L (ref 15–41)
Albumin: 4.4 g/dL (ref 3.5–5.0)
Anion gap: 8 (ref 5–15)
BILIRUBIN TOTAL: 0.7 mg/dL (ref 0.3–1.2)
BUN: 16 mg/dL (ref 6–20)
CALCIUM: 10.1 mg/dL (ref 8.9–10.3)
CHLORIDE: 104 mmol/L (ref 101–111)
CO2: 28 mmol/L (ref 22–32)
Creatinine, Ser: 0.74 mg/dL (ref 0.44–1.00)
GFR calc non Af Amer: >60 mL/min (ref >60)
GLUCOSE: 103 mg/dL — AB (ref 65–99)
Potassium: 3.5 mmol/L (ref 3.5–5.1)
SODIUM: 140 mmol/L (ref 135–145)
Total Protein: 7.3 g/dL (ref 6.5–8.1)

## 2016-05-09 LAB — CBC
HCT: 39.7 % (ref 36.0–46.0)
Hemoglobin: 13.4 g/dL (ref 12.0–15.0)
MCH: 30.8 pg (ref 26.0–34.0)
MCHC: 33.8 g/dL (ref 30.0–36.0)
MCV: 91.3 fL (ref 78.0–100.0)
Platelets: 277 K/uL (ref 150–400)
RBC: 4.35 MIL/uL (ref 3.87–5.11)
RDW: 12.9 % (ref 11.5–15.5)
WBC: 4.3 K/uL (ref 4.0–10.5)

## 2016-05-09 MED ORDER — CEPHALEXIN 500 MG PO CAPS: 500.0000 mg | ORAL_CAPSULE | Freq: Two times a day (BID) | ORAL | 0 refills | Status: AC

## 2016-05-09 NOTE — Discharge Instructions (Signed)

## 2016-05-09 NOTE — ED Provider Notes (Signed)
Emergency Department Provider Note   I have reviewed the triage vital signs and the nursing notes.   HISTORY  Chief Complaint Abdominal Pain and Dysuria   HPI Julie Farrell is a 71 y.o. female with PMH of HLD, HTN, hypothyroid, and anxiety presents to the emergency department for evaluation of lower abdominal discomfort and urinary hesitancy with some mild dysuria. Patient has been taking doxycycline for urinary tract infection diagnosed by her primary care physician. She states she's been taking his medication but symptoms have not improved. She denies any fever or shaking chills. No chest pain or difficulty breathing. No productive cough. She denies any hematuria. No back pain. She has history of frequent urinary tract infections. No radiation of symptoms.    Past Medical History:  Diagnosis Date  . Abnormal pap   . Anxiety   . DDD (degenerative disc disease), lumbar   . Hemorrhoids   . HPV test positive 05/17/2013  . Hyperlipidemia   . Hypertension   . Hypothyroidism   . Thyroid disease     Patient Active Problem List   Diagnosis Date Noted  . Spondylolisthesis of lumbar region 10/31/2015  . HPV test positive 05/17/2013  . Cataract 05/11/2012    Past Surgical History:  Procedure Laterality Date  . CATARACT EXTRACTION W/PHACO Left 05/16/2012   Procedure: CATARACT EXTRACTION PHACO AND INTRAOCULAR LENS PLACEMENT (IOC);  Surgeon: Gemma Payor, MD;  Location: [facility];  Service: Ophthalmology;  Laterality: Left;  CDE:15.20  . COLONOSCOPY  2009   [facility]-Dr. Karilyn Cota  . EYE SURGERY Bilateral 14,15   cataracts  . Fatty Tissue Excision Left 2008   [facility]-Dr. Hilda Lias arm  . right ankle Right    fx    Current Outpatient Rx  . Order #: [id number] Class: Historical Med  . Order #: [id number] Class: Historical Med  . Order #: [id number] Class: Print  . Order #: [id number] Class: Historical Med  . Order #: [id number] Class: Historical Med    Allergies No known allergies and Vicodin  [hydrocodone-acetaminophen]  Family History  Problem Relation Age of Onset  . Heart attack Mother   . Heart disease Mother     CAD  . Heart disease Brother   . Heart disease Brother   . Hypertension Father   . Stroke Father     Social History Social History  Substance Use Topics  . Smoking status: Never Smoker  . Smokeless tobacco: Never Used  . Alcohol use No    Review of Systems  Constitutional: No fever/chills Eyes: No visual changes. ENT: No sore throat. Cardiovascular: Denies chest pain. Respiratory: Denies shortness of breath. Gastrointestinal: No abdominal pain.  No nausea, no vomiting.  No diarrhea.  No constipation. Genitourinary: Positive for dysuria and hesitancy.  Musculoskeletal: Negative for back pain. Skin: Negative for rash. Neurological: Negative for headaches, focal weakness or numbness.  10-point ROS otherwise negative.  ____________________________________________   PHYSICAL EXAM:  VITAL SIGNS: ED Triage Vitals  Enc Vitals Group     BP 05/09/16 0700 (!) 135/54     Pulse Rate 05/09/16 0700 76     Resp 05/09/16 0700 18     Temp 05/09/16 0700 99.1 F (37.3 C)     Temp Source 05/09/16 0700 Oral     SpO2 05/09/16 0700 100 %     Weight 05/09/16 0658 110 lb (49.9 kg)     Height 05/09/16 0658  (1.575 m)     Pain Score 05/09/16 0658 8   Constitutional: Alert and oriented. Well appearing  and in no acute distress. Eyes: Conjunctivae are normal Head: Atraumatic. Nose: No congestion/rhinnorhea. Mouth/Throat: Mucous membranes are moist.  Oropharynx non-erythematous. Neck: No stridor.   Cardiovascular: Normal rate, regular rhythm. Good peripheral circulation. Grossly normal heart sounds.   Respiratory: Normal respiratory effort.  No retractions. Lungs CTAB. Gastrointestinal: Soft and nontender. No distention.  Musculoskeletal: No lower extremity tenderness nor edema. No gross deformities of extremities. Neurologic:  Normal speech and  language. No gross focal neurologic deficits are appreciated.  Skin:  Skin is warm, dry and intact. No rash noted. Psychiatric: Mood and affect are normal. Speech and behavior are normal.  ____________________________________________   LABS (all labs ordered are listed, but only abnormal results are displayed)  Labs Reviewed  COMPREHENSIVE METABOLIC PANEL - Abnormal; Notable for the following:       Result Value   Glucose, Bld 103 (*)    All other components within normal limits  URINALYSIS, ROUTINE W REFLEX MICROSCOPIC - Abnormal; Notable for the following:    Hgb urine dipstick SMALL (*)    All other components within normal limits  CBC   ____________________________________________   PROCEDURES  Procedure(s) performed:   Procedures  None ____________________________________________   INITIAL IMPRESSION / ASSESSMENT AND PLAN / ED COURSE  Pertinent labs & imaging results that were available during my care of the patient were reviewed by me and considered in my medical decision making (see chart for details).  Patient resents to the emergency department for evaluation of dysuria and hesitancy in the setting of recently diagnosed during her tract infection. She has been compliant with her doxycycline with no improvement in symptoms. The patient has no anterior abdominal tenderness. No back pain, flulike symptoms, fever to suggest developing pyelonephritis. Given the patient's age plan to obtain labs and urinary analysis.   08:47 PM Patient with no evidence of urinary tract infection. I suspect this may be complicated by being prescribed doxycycline by her primary care physician. This seems unusual choice for coverage of a urinary tract infection. I have no access to that urinalysis or culture results. With the patient's dysuria and clinical symptoms managing UTI I plan to start Keflex and have the patient discontinue doxycycline. I advised she should follow with her primary  care physician to review the urinalysis and possible culture results that may be available at that time. Patient has no concerning lab values or concerning findings on abdominal exam to push me for further testing or imaging at this time. She denies any vaginal bleeding or discharge to explain her symptoms. On my evaluation the patient is sitting upright in her bedside chair in no acute distress.   At this time, I do not feel there is any life-threatening condition present. I have reviewed and discussed all results (EKG, imaging, lab, urine as appropriate), exam findings with patient. I have reviewed nursing notes and appropriate previous records.  I feel the patient is safe to be discharged home without further emergent workup. Discussed usual and customary return precautions. Patient and family (if present) verbalize understanding and are comfortable with this plan.  Patient will follow-up with their primary care provider. If they do not have a primary care provider, information for follow-up has been provided to them. All questions have been answered.  ____________________________________________  FINAL CLINICAL IMPRESSION(S) / ED DIAGNOSES  Final diagnoses:  Dysuria     MEDICATIONS GIVEN DURING THIS VISIT:  None  NEW OUTPATIENT MEDICATIONS STARTED DURING THIS VISIT:  New Prescriptions   CEPHALEXIN (KEFLEX) 500 MG  CAPSULE    Take 1 capsule (500 mg total) by mouth 2 (two) times daily.      Note:  This document was prepared using Dragon voice recognition software and may include unintentional dictation errors.  Alona Bene, MD Emergency Medicine   Maia Plan, MD 05/10/16 [id number]

## 2016-05-09 NOTE — ED Triage Notes (Signed)
Pt reports dysuria for 2 weeks, treated with antibiotics and has returned with lower abd pain and urinary frequency.

## 2016-05-18 DIAGNOSIS — I10 Essential (primary) hypertension: Secondary | ICD-10-CM | POA: Diagnosis not present

## 2016-05-18 DIAGNOSIS — R350 Frequency of micturition: Secondary | ICD-10-CM | POA: Diagnosis not present

## 2016-05-18 DIAGNOSIS — E063 Autoimmune thyroiditis: Secondary | ICD-10-CM | POA: Diagnosis not present

## 2016-05-18 DIAGNOSIS — E782 Mixed hyperlipidemia: Secondary | ICD-10-CM | POA: Diagnosis not present

## 2016-05-18 DIAGNOSIS — F419 Anxiety disorder, unspecified: Secondary | ICD-10-CM | POA: Diagnosis not present

## 2016-05-18 DIAGNOSIS — F329 Major depressive disorder, single episode, unspecified: Secondary | ICD-10-CM | POA: Diagnosis not present

## 2016-05-18 DIAGNOSIS — Z681 Body mass index (BMI) 19 or less, adult: Secondary | ICD-10-CM | POA: Diagnosis not present

## 2016-05-19 ENCOUNTER — Ambulatory Visit (INDEPENDENT_AMBULATORY_CARE_PROVIDER_SITE_OTHER): Payer: Medicare HMO | Admitting: Diagnostic Neuroimaging

## 2016-05-19 ENCOUNTER — Encounter: Payer: Self-pay | Admitting: Diagnostic Neuroimaging

## 2016-05-19 VITALS — BP 119/70 | HR 72 | Ht 62.0 in | Wt 104.8 lb

## 2016-05-19 DIAGNOSIS — R29898 Other symptoms and signs involving the musculoskeletal system: Secondary | ICD-10-CM

## 2016-05-19 DIAGNOSIS — R269 Unspecified abnormalities of gait and mobility: Secondary | ICD-10-CM

## 2016-05-19 DIAGNOSIS — R258 Other abnormal involuntary movements: Secondary | ICD-10-CM

## 2016-05-19 DIAGNOSIS — R498 Other voice and resonance disorders: Secondary | ICD-10-CM

## 2016-05-19 MED ORDER — CARBIDOPA-LEVODOPA 25-100 MG PO TABS: 1.0000 | ORAL_TABLET | Freq: Three times a day (TID) | ORAL | 6 refills | Status: DC

## 2016-05-19 NOTE — Patient Instructions (Signed)
Thank you for coming to see [place] at [facility]. I hope we have been able to provide you high quality care today.  You may receive a patient satisfaction survey over the next few weeks. We would appreciate your feedback and comments so that we may continue to improve ourselves and the health of our patients.  - check MRI brain  - start carbidopa/levodopa (half tab three times per day with meals; after 2 weeks increase to 1 full tab three times per day)  - physical therapy evaluation  - use rollator walker   ~~~~~~~~~~~~~~~~~~~~~~~~~~~~~~~~~~~~~~~~~~~~~~~~~~~~~~~~~~~~~~~~~  DR. Forever Arechiga'S GUIDE TO HAPPY AND HEALTHY LIVING These are some of my general health and wellness recommendations. Some of them may apply to you better than others. Please use common sense as you try these suggestions and feel free to ask me any questions.   ACTIVITY/FITNESS Mental, social, emotional and physical stimulation are very important for brain and body health. Try learning a new activity (arts, music, language, sports, games).  Keep moving your body to the best of your abilities. You can do this at home, inside or outside, the park, community center, gym or anywhere you like. Consider a physical therapist or personal trainer to get started. Consider the app Sworkit. Fitness trackers such as smart-watches, smart-phones or Fitbits can help as well.   NUTRITION Eat more plants: colorful vegetables, nuts, seeds and berries.  Eat less sugar, salt, preservatives and processed foods.  Avoid toxins such as cigarettes and alcohol.  Drink water when you are thirsty. Warm water with a slice of lemon is an excellent morning drink to start the day.  Consider these websites for more information The Nutrition Source (https://www.henry-hernandez.biz/) [employer] Nutrition (WindowBlog.ch)   RELAXATION Consider practicing mindfulness meditation or other  relaxation techniques such as deep breathing, prayer, yoga, tai chi, massage. See website mindful.org or the apps Headspace or Calm to help get started.   SLEEP Try to get at least 7-8+ hours sleep per day. Regular exercise and reduced caffeine will help you sleep better. Practice good sleep hygeine techniques. See website sleep.org for more information.   PLANNING Prepare estate planning, living will, healthcare POA documents. Sometimes this is best planned with the help of an attorney. Theconversationproject.org and agingwithdignity.org are excellent resources.

## 2016-05-19 NOTE — Progress Notes (Signed)
[facility]  PATIENT: Julie Farrell DOB: 1946-01-07  REFERRING CLINICIAN: Junius Argyle HISTORY FROM: patient and daughter  REASON FOR VISIT: new consult    HISTORICAL  CHIEF COMPLAINT:  Chief Complaint  Patient presents with  . Cerebrovascular Accident    rm 7, New Pt, dgtr- Rosey Bath, " possibly old CVA; left leg weakness x 6 mos"  . Speech/language deficit    HISTORY OF PRESENT ILLNESS:   71 year old right-handed female here for evaluation of speech, language, gait difficulty. Patient has had gradual onset, progressive speech, gait, leg difficulty for past 1 year. Patient having soft hoarse voice, difficult to understand, some mild swallowing difficulty. Patient also having issues with small short steps, decreased mobility, decreased movement, decreased speed of activities. Sometimes she feels her toes draw up on the left side. Patient having decreased facial expressions. Patient having more problems with anxiety and "jitteriness". She is also had 40 pound weight loss over the past one year, unintentionally. Patient imaging generalized fatigue, swelling legs, constipation, not asleep.  No family history of tremor, Parkinson's disease or other similar problems. No sudden onset of any symptoms.   REVIEW OF SYSTEMS: Full 14 system review of systems performed and negative with exception of: As per history of present illness.  ALLERGIES: Allergies  Allergen Reactions  . No Known Allergies   . Vicodin [Hydrocodone-Acetaminophen] Other (See Comments)    Upset stomach    HOME MEDICATIONS: Outpatient Medications Prior to Visit  Medication Sig Dispense Refill  . amLODipine (NORVASC) 5 MG tablet Take 5 mg by mouth daily.   1  . aspirin EC 81 MG tablet Take 81 mg by mouth daily.    Marland Kitchen levothyroxine (SYNTHROID, LEVOTHROID) 50 MCG tablet Take 50 mcg by mouth daily.  0  . simvastatin (ZOCOR) 20 MG tablet Take 20 mg by mouth at bedtime.     No facility-administered  medications prior to visit.     PAST MEDICAL HISTORY: Past Medical History:  Diagnosis Date  . Abnormal pap   . Anxiety   . DDD (degenerative disc disease), lumbar   . Hemorrhoids   . HPV test positive 05/17/2013  . Hyperlipidemia   . Hypertension   . Hypothyroidism   . Thyroid disease     PAST SURGICAL HISTORY: Past Surgical History:  Procedure Laterality Date  . BACK SURGERY  10/2015   fusion L4-L 5, Dr Lovell Sheehan  . CATARACT EXTRACTION W/PHACO Left 05/16/2012   Procedure: CATARACT EXTRACTION PHACO AND INTRAOCULAR LENS PLACEMENT (IOC);  Surgeon: Gemma Payor, MD;  Location: [facility];  Service: Ophthalmology;  Laterality: Left;  CDE:15.20  . COLONOSCOPY  2009   [facility]-Dr. Karilyn Cota  . EYE SURGERY Bilateral 14,15   cataracts  . Fatty Tissue Excision Left 2008   [facility]-Dr. Hilda Lias arm  . right ankle Right    fx    FAMILY HISTORY: Family History  Problem Relation Age of Onset  . Heart attack Mother   . Heart disease Mother     CAD  . Heart disease Brother   . Heart disease Brother   . Hypertension Father   . Stroke Father     SOCIAL HISTORY:  Social History   Social History  . Marital status: Divorced    Spouse name: N/A  . Number of children: 1  . Years of education: GED   Occupational History  .      NA   Social History Main Topics  . Smoking status: Never Smoker  . Smokeless tobacco:  Never Used  . Alcohol use No  . Drug use: No  . Sexual activity: Not Currently    Birth control/ protection: Post-menopausal   Other Topics Concern  . Not on file   Social History Narrative   Lives with daughter   Caffeine -coffee occas     PHYSICAL EXAM  GENERAL EXAM/CONSTITUTIONAL: Vitals:  Vitals:   05/19/16 1038  BP: 119/70  Pulse: 72  Weight: 104 lb 12.8 oz (47.5 kg)  Height:  (1.575 m)     Body mass index is 19.17 kg/m.  Visual Acuity Screening   Right eye Left eye Both eyes  Without correction: 20/50 20/50   With correction:        Patient is  in no distress; well developed, nourished and groomed; neck is supple  MASKED FACIES  CARDIOVASCULAR:  Examination of carotid arteries is normal; no carotid bruits  Regular rate and rhythm, no murmurs  Examination of peripheral vascular system by observation and palpation is normal  EYES:  Ophthalmoscopic exam of optic discs and posterior segments is normal; no papilledema or hemorrhages  MUSCULOSKELETAL:  Gait, strength, tone, movements noted in Neurologic exam below  NEUROLOGIC: MENTAL STATUS:  No flowsheet data found.  awake, alert, oriented to person, place and time  recent and remote memory intact  normal attention and concentration  language fluent, comprehension intact, naming intact,   fund of knowledge appropriate  CRANIAL NERVE:   2nd - no papilledema on fundoscopic exam  2nd, 3rd, 4th, 6th - pupils equal and reactive to light, visual fields full to confrontation, extraocular muscles intact, no nystagmus  5th - facial sensation symmetric  7th - facial strength symmetric  8th - hearing intact  9th - palate elevates symmetrically, uvula midline  11th - shoulder shrug symmetric  12th - tongue protrusion midline  HOARSE VOICE  SOFT VOLUME  MOTOR:   COGWHEELING RIGIDITY IN BUE  MODERATE-SEVERE BRADYKINESIA IN BUE (LUE SLOWER THAN RIGHT) AND BLE  FINE POSTURAL AND RESTING TREMOR  full strength in the BUE, BLE  SENSORY:   normal and symmetric to light touch, temperature, vibration  COORDINATION:   finger-nose-finger, fine finger movements MILD DYSMETRIA  REFLEXES:   deep tendon reflexes BRISK IN BUE; BLE 2  GAIT/STATION:   STOOPED POSTURE; SHORT STEPS; SHUFFLING GAIT; EN BLOC TURNING; FREEZING WITH TURNS    DIAGNOSTIC DATA (LABS, IMAGING, TESTING) - I reviewed patient records, labs, notes, testing and imaging myself where available.  Lab Results  Component Value Date   WBC 4.3 05/09/2016   HGB 13.4 05/09/2016   HCT 39.7  05/09/2016   MCV 91.3 05/09/2016   PLT 277 05/09/2016      Component Value Date/Time   NA 140 05/09/2016 0708   K 3.5 05/09/2016 0708   CL 104 05/09/2016 0708   CO2 28 05/09/2016 0708   GLUCOSE 103 (H) 05/09/2016 0708   BUN 16 05/09/2016 0708   CREATININE 0.74 05/09/2016 0708   CALCIUM 10.1 05/09/2016 0708   PROT 7.3 05/09/2016 0708   ALBUMIN 4.4 05/09/2016 0708   AST 20 05/09/2016 0708   ALT 15 05/09/2016 0708   ALKPHOS 67 05/09/2016 0708   BILITOT 0.7 05/09/2016 0708   GFRNONAA >60 05/09/2016 0708   GFRAA >60 05/09/2016 0708   No results found for: CHOL, HDL, LDLCALC, LDLDIRECT, TRIG, CHOLHDL No results found for: BJYN8G No results found for: VITAMINB12 No results found for: TSH   10/31/15 xray lumbar [I reviewed images myself and agree with  interpretation. -VRP]  - Posterior lumbar interbody fusion at L4-L5.     ASSESSMENT AND PLAN  71 y.o. year old female here with gradual onset progressive speech, swallowing, gait difficulty. Exam notable for bradykinesia, cogwheel rigidity, short shuffling steps, postural instability, rare resting tremor. Signs and symptoms consistent with idiopathic Parkinson's disease. Will proceed with further workup and treatment.   Ddx: parkinsonism (idiopathic parkinson's disease vs vascular parkinsonism)  1. Gait difficulty   2. Hypophonia   3. Bradykinesia   4. Cogwheel rigidity      PLAN: - check MRI brain - empiric trial of carb/levo (half tab three times per day with meals; after 2 weeks increase to 1 full tab three times per day) - physical therapy evaluation - use rollator walker  Orders Placed This Encounter  Procedures  . For home use only DME 4 wheeled rolling walker with seat  . MR BRAIN WO CONTRAST  . Ambulatory referral to Physical Therapy    Meds ordered this encounter  Medications  . carbidopa-levodopa (SINEMET IR) 25-100 MG tablet    Sig: Take 1 tablet by mouth 3 (three) times daily before meals.     Dispense:  90 tablet    Refill:  6   Return in about 2 months (around 07/19/2016).    Suanne Marker, MD 05/19/2016, 11:27 AM Certified in Neurology, Neurophysiology and Neuroimaging  [facility] [address] [place], [place] [address] [phone]

## 2016-05-20 ENCOUNTER — Telehealth: Payer: Self-pay | Admitting: Diagnostic Neuroimaging

## 2016-05-20 NOTE — Telephone Encounter (Signed)
DME, walker order faxed as requested by daughter. Received confirmation.

## 2016-05-20 NOTE — Telephone Encounter (Signed)
Pt daughter calling re: DME for the 4 wheel walker for pt through [facility], they are needing an order faxed to them for the walker.  Their fax#[phone] they are requesting a copy of the insurance card that is on file here and the prescription for the 4 wheel walker.  The number for [facility] is [phone]

## 2016-05-27 DIAGNOSIS — R269 Unspecified abnormalities of gait and mobility: Secondary | ICD-10-CM | POA: Diagnosis not present

## 2016-06-01 ENCOUNTER — Telehealth: Payer: Self-pay | Admitting: Diagnostic Neuroimaging

## 2016-06-01 ENCOUNTER — Ambulatory Visit (HOSPITAL_COMMUNITY): Payer: Medicare HMO | Attending: Diagnostic Neuroimaging | Admitting: Physical Therapy

## 2016-06-01 ENCOUNTER — Encounter (HOSPITAL_COMMUNITY): Payer: Self-pay | Admitting: Physical Therapy

## 2016-06-01 DIAGNOSIS — R278 Other lack of coordination: Secondary | ICD-10-CM | POA: Diagnosis not present

## 2016-06-01 DIAGNOSIS — R29898 Other symptoms and signs involving the musculoskeletal system: Secondary | ICD-10-CM | POA: Insufficient documentation

## 2016-06-01 DIAGNOSIS — M6281 Muscle weakness (generalized): Secondary | ICD-10-CM | POA: Diagnosis not present

## 2016-06-01 DIAGNOSIS — R2681 Unsteadiness on feet: Secondary | ICD-10-CM | POA: Insufficient documentation

## 2016-06-01 DIAGNOSIS — R2689 Other abnormalities of gait and mobility: Secondary | ICD-10-CM | POA: Diagnosis not present

## 2016-06-01 NOTE — Telephone Encounter (Signed)
Patients daughter Rosey Batheresa called office in reference to carbidopa-levodopa (SINEMET IR) 25-100 MG tablet.  Daughter states medication has made the patient dizzy, sleepy, and unable to get up and go and symptoms got worse.  Patient is taking half a tablet a day and daughter feels patient needs to stop the medication all together.

## 2016-06-01 NOTE — Telephone Encounter (Signed)
Ok to stop for 1-2 weeks and then consider retrying in future. -VRP

## 2016-06-01 NOTE — Telephone Encounter (Signed)
Spoke with daughter, Rosey [place]resa, on [place] and advised Dr Marjory LiesPenumalli stated patient may stop for 1-2 weeks, let side effects resolve. Advised her body may need more time to adjust to medication. Advised she may consider retrying medication in the future. Patient had rehab today, and daughter stated "it's looking like she may have parkinson's" . This RN advised she continue with rehab therapy, consider retrying medication, come in for scheduled FU in July. Daughter stated she would call if any other questions, verbalized understanding, appreciation.

## 2016-06-01 NOTE — Therapy (Signed)
[facility] [address] [place], [place], [address] Phone: [phone]   Fax:  [phone]  Physical Therapy Evaluation  Patient Details  Name: Julie Farrell MRN: [id number] Date of Birth: Dec 06, 1945 Referring Provider: Joycelyn Schmid   Encounter Date: 06/01/2016      PT End of Session - 06/01/16 1442    Visit Number 1   Number of Visits 9   Date for PT Re-Evaluation 06/29/16   Authorization Type [employer] H[employer]    Authorization Time Period 06/01/16 to 08/01/16   Authorization - Visit Number 1   Authorization - Number of Visits 10   PT Start Time 1306   PT Stop Time 1351   PT Time Calculation (min) 45 min   Equipment Utilized During Treatment Gait belt   Activity Tolerance Patient tolerated treatment well   Behavior During Therapy Anxious;Flat affect      Past Medical History:  Diagnosis Date  . Abnormal pap   . Anxiety   . DDD (degenerative disc disease), lumbar   . Hemorrhoids   . HPV test positive 05/17/2013  . Hyperlipidemia   . Hypertension   . Hypothyroidism   . Thyroid disease     Past Surgical History:  Procedure Laterality Date  . BACK SURGERY  10/2015   fusion L4-L 5, Dr Lovell Sheehan  . CATARACT EXTRACTION W/PHACO Left 05/16/2012   Procedure: CATARACT EXTRACTION PHACO AND INTRAOCULAR LENS PLACEMENT (IOC);  Surgeon: Gemma Payor, MD;  Location: [facility];  Service: Ophthalmology;  Laterality: Left;  CDE:15.20  . COLONOSCOPY  2009   [facility]-Dr. Karilyn Cota  . EYE SURGERY Bilateral 14,15   cataracts  . Fatty Tissue Excision Left 2008   [facility]-Dr. Hilda Lias arm  . right ankle Right    fx    There were no vitals filed for this visit.       Subjective Assessment - 06/01/16 1308    Subjective Patient states that she had trouble walking after back surgery; daughter reports that at first it was OK but then she started having more trouble. Her surgery was October the 12th, 2017. At first they thought that she had had a CVA;  when they went to get checked, they were told that patient had Parkinsons and they gave her a new medicine to try. They report she has had quite a bit of trouble with the Parkinsons medicine and they are stopping taking this. No falls but daughter reports some close calls, patient has been able to self-correct. They both state taht patient is very nervous and anxious; patient states that ever since her back surgery she has been terrified that she will hurt herself.    Pertinent History spondylolisthesis with lumbar fusion done 10/17, anxiety, hypothyroidism   Patient Stated Goals walk better, improve strength    Currently in Pain? No/denies            [facility] PT Assessment - 06/01/16 0001      Assessment   Medical Diagnosis gait difficulty    Referring Provider Vikram Penumalli    Onset Date/Surgical Date --  10/17   Next MD Visit Dr. Marjory Lies after MRI, waiting to be scheduled    Prior Therapy none      Precautions   Precautions Fall     Balance Screen   Has the patient fallen in the past 6 months Yes   How many times? stumbles when trying to get in/out of chair    Has the patient had a decrease  in activity level because of a fear of falling?  Yes   Is the patient reluctant to leave their home because of a fear of falling?  Yes     Prior Function   Level of Independence Independent;Independent with basic ADLs;Independent with gait;Independent with transfers   Vocation Retired     Observation/Other Assessments   Observations mild cogwheel rigidity noted especially L UE/LE; RAM intact      ROM / Strength   AROM / PROM / Strength Strength;AROM     AROM   Overall AROM Comments significant HS tightness B, signifficant gastroc tightess L especially limiting gait      Strength   Right Hip Flexion 3+/5   Right Hip ABduction 3-/5   Left Hip Flexion 4-/5   Left Hip ABduction 3/5   Right Knee Extension 4/5   Left Knee Extension 4/5   Right Ankle Dorsiflexion 3/5   Left Ankle  Dorsiflexion 3/5     Ambulation/Gait   Gait Comments reduced gait speed, B HS tightness, lfexed posture, reduced step lengths, toe touch L LE due to kene/ankle stiffness, dificulty with corners   general unsteadiness      6 minute walk test results    Aerobic Endurance Distance Walked 400   Endurance additional comments 3MWT      Standardized Balance Assessment   Standardized Balance Assessment Timed Up and Go Test     Timed Up and Go Test   Normal TUG (seconds) 14.6   Manual TUG (seconds) 20.8   Cognitive TUG (seconds) 16.5     High Level Balance   High Level Balance Comments posterior loss of balance with retrogait, Max (A) to recover balance, poor gait backwards                            PT Education - 06/01/16 1441    Education provided Yes   Education Details results of Parkinsonian specific tests (rigidity, retropulsion, freezing) as well as general examination findings in general; prognosis, POC, HEP to be given next time; strong encouragement to follow up with MD regarding Parkinsonian symptoms, benefits of PWR training and physical activity     Person(s) Educated Patient;Child(ren)   Methods Explanation   Comprehension Verbalized understanding;Need further instruction          PT Short Term Goals - 06/01/16 1453      PT SHORT TERM GOAL #1   Title Patient and family to be able to verbally state the importance of regular physical activity in managing Parkinsonian symptoms and maintaining high level of function    Time 4   Period Weeks   Status New     PT SHORT TERM GOAL #2   Title Patient to be participatory in regular walking program of at least 10 continuous minutes in duration, twice a day, 5 days per week in order to improve functional activity tolerance and establish routine of regular physical activity    Time 4   Period Weeks   Status New     PT SHORT TERM GOAL #3   Title Patient to be able to state 5/5 factors in order to improve  safety at home and in community to assist in preventing fall and fall related injury    Time 4   Period Weeks   Status New     PT SHORT TERM GOAL #4   Title Patient to demonstrate improved B hamstring and gastroc muscle stiffness as evidenced  by ability to consistently achieve heel strike and TKE bilaterally and consistently during gait cycle    Time 4   Period Weeks   Status New     PT SHORT TERM GOAL #5   Title Patient to be independent in correctly and consistently performing appropriate HEP, to be updated weekly    Time 1   Period Weeks   Status New           PT Long Term Goals - 06/01/16 1456      PT LONG TERM GOAL #1   Title Patient to demonstrate an improvement of 1.5 MMT grades in all tested muscle groups in order to improve gait and general mobility   Time 8   Period Weeks   Status New     PT LONG TERM GOAL #2   Title Patient to be able to ambulate at least 558ft during with no device in order to demonstrate improved mobility and access to home and community    Time 8   Period Weeks   Status New     PT LONG TERM GOAL #3   Title Patient to be able to complete standard TUG in no more than 13 seconds, as well as manual and cognitive TUG tests in no more than 15 seconds, with no device in order to show improved balance and dual tasking ability    Time 8   Period Weeks   Status New     PT LONG TERM GOAL #4   Title Patient to be able to complete retro-gait with consistent improvement in step lengths, posture, and with elimination of posterior LOB with activity in order to show improved balance and overall reduced fall risk    Time 8   Period Weeks   Status New     PT LONG TERM GOAL #5   Title Patient to be participatory in regular aerobic exercise program at least 4-5 days per week in order to maintain functional gains and assist in managing Parkinsonian symptoms moving forwards    Time 8   Period Weeks   Status New               Plan - 06/01/16  1443    Clinical Impression Statement Patient arrives with a referral for gait instability from referring MD; patient and her daughter state that things really seemed to go downhill after she had lumbar fusion surgery in October 2017, to the point that they thought she might have had a CVA, however to their knowledge this was ruled out and referring MD believes she may have Parkinsons. Patient/daughter seem to be in denial of Parkinsons diagnosis at this time and state they are not taking PD medication due to poor tolerance of this by patient. Examination reveals significant muscle stiffness in B LEs, as well as severe gait deviation, general unsteadiness, functional muscle weakness, difficulty with dual tasking, poor safety awareness, and reduced functional activity tolerance. At this time patient is a fairly high fall risk based on mobility and balance testing. Noted classic Parkinsonian presentation today, including masked appearance of face, soft tone of voice with some apparent difficult with speech, Parkinsonian gait pattern, difficulty with retro-gait and requiring Max(A) to recover balance, and mild cogwheel rigidity noted. Provided extensive education regarding Parkinsonian appearance and that PT findings are actually in agreement with that of referring MD, extensive conversation regarding general education about Parkinsons Disease as well as prognosis and importance of being proactive with follow-ups with MD and regular  physical activity to manage Parkinsonian symptoms. Patient states financial concerns regarding co-pay at this time, nevertheless strongly recommend skilled PT services in order to address functional deficits, reduce fall risk, and assist in reaching optimal level of function.    Rehab Potential Fair   Clinical Impairments Affecting Rehab Potential (+) supportive family, high PLOF; (-) apparent denial of Parkinsonian diagnosis, recent problems with health, significant decline in  functional after surgery last year, financial concerns with co-pay    PT Frequency 1x / week  due to financial concerns over co-pay    PT Duration 8 weeks   PT Treatment/Interventions ADLs/Self Care Home Management;Biofeedback;DME Instruction;Gait training;Stair training;Functional mobility training;Therapeutic activities;Therapeutic exercise;Balance training;Neuromuscular re-education;Patient/family education;Orthotic Fit/Training;Manual techniques;Passive range of motion;Energy conservation;Taping   PT Next Visit Plan review initial eval and goals; need to assign HEP (seated PWR and stretching). Discuss OT/speech. Focus initially on PWR training, functional stretching of LEs, balance, dual tasking, functional strength    PT Home Exercise Plan assign 2nd session- seated PWR and functional LE stretching    Recommended Other Services skilled OT and Speech therapy services    Consulted and Agree with Plan of Care Patient      Patient will benefit from skilled therapeutic intervention in order to improve the following deficits and impairments:  Abnormal gait, Improper body mechanics, Decreased coordination, Decreased mobility, Impaired tone, Postural dysfunction, Decreased activity tolerance, Decreased range of motion, Decreased strength, Decreased balance, Decreased safety awareness, Difficulty walking  Visit Diagnosis: Unsteadiness on feet - Plan: PT plan of care cert/re-cert  Other abnormalities of gait and mobility - Plan: PT plan of care cert/re-cert  Other lack of coordination - Plan: PT plan of care cert/re-cert  Muscle weakness (generalized) - Plan: PT plan of care cert/re-cert  Other symptoms and signs involving the musculoskeletal system - Plan: PT plan of care cert/re-cert      G-Codes - 2016-06-11 1502    Functional Assessment Tool Used (Outpatient Only) Based on skilled clinical assessment of gait, flexibility, balance, strength, fall risk    Functional Limitation Mobility:  Walking and moving around   Mobility: Walking and Moving Around Current Status ([id number]) At least 60 percent but less than 80 percent impaired, limited or restricted   Mobility: Walking and Moving Around Goal Status [phone]) At least 40 percent but less than 60 percent impaired, limited or restricted       Problem List Patient Active Problem List   Diagnosis Date Noted  . Spondylolisthesis of lumbar region 10/31/2015  . HPV test positive 05/17/2013  . Cataract 05/11/2012   Nedra Hai PT, DPT [phone]  [facility] [address] [place], [place], [address] Phone: [phone]   Fax:  [phone]  Name: KHAMIYA VARIN MRN: [id number] Date of Birth: July 16, 1945

## 2016-06-02 DIAGNOSIS — I10 Essential (primary) hypertension: Secondary | ICD-10-CM | POA: Diagnosis not present

## 2016-06-02 DIAGNOSIS — Z961 Presence of intraocular lens: Secondary | ICD-10-CM | POA: Diagnosis not present

## 2016-06-02 DIAGNOSIS — H43822 Vitreomacular adhesion, left eye: Secondary | ICD-10-CM | POA: Diagnosis not present

## 2016-06-02 DIAGNOSIS — H26493 Other secondary cataract, bilateral: Secondary | ICD-10-CM | POA: Diagnosis not present

## 2016-06-02 DIAGNOSIS — H26492 Other secondary cataract, left eye: Secondary | ICD-10-CM | POA: Diagnosis not present

## 2016-06-08 ENCOUNTER — Ambulatory Visit (HOSPITAL_COMMUNITY): Payer: Medicare HMO | Admitting: Physical Therapy

## 2016-06-08 DIAGNOSIS — R278 Other lack of coordination: Secondary | ICD-10-CM | POA: Diagnosis not present

## 2016-06-08 DIAGNOSIS — M6281 Muscle weakness (generalized): Secondary | ICD-10-CM | POA: Diagnosis not present

## 2016-06-08 DIAGNOSIS — R2681 Unsteadiness on feet: Secondary | ICD-10-CM | POA: Diagnosis not present

## 2016-06-08 DIAGNOSIS — R2689 Other abnormalities of gait and mobility: Secondary | ICD-10-CM

## 2016-06-08 DIAGNOSIS — R29898 Other symptoms and signs involving the musculoskeletal system: Secondary | ICD-10-CM | POA: Diagnosis not present

## 2016-06-08 NOTE — Therapy (Signed)
[facility] [address] [place], [place], [address] Phone: [phone]   Fax:  [phone]  Physical Therapy Treatment  Patient Details  Name: Julie Farrell MRN: [id number] Date of Birth: Mar 12, 1945 Referring Provider: Joycelyn Schmid   Encounter Date: 06/08/2016      PT End of Session - 06/08/16 1600    Visit Number 2   Number of Visits 9   Date for PT Re-Evaluation 06/29/16   Authorization Type [employer]    Authorization Time Period 06/01/16 to 08/01/16   Authorization - Visit Number 2   Authorization - Number of Visits 10   PT Start Time 1517   PT Stop Time 1558   PT Time Calculation (min) 41 min   Activity Tolerance Patient tolerated treatment well   Behavior During Therapy [facility] for tasks assessed/performed;Anxious      Past Medical History:  Diagnosis Date  . Abnormal pap   . Anxiety   . DDD (degenerative disc disease), lumbar   . Hemorrhoids   . HPV test positive 05/17/2013  . Hyperlipidemia   . Hypertension   . Hypothyroidism   . Thyroid disease     Past Surgical History:  Procedure Laterality Date  . BACK SURGERY  10/2015   fusion L4-L 5, Dr Lovell Sheehan  . CATARACT EXTRACTION W/PHACO Left 05/16/2012   Procedure: CATARACT EXTRACTION PHACO AND INTRAOCULAR LENS PLACEMENT (IOC);  Surgeon: Gemma Payor, MD;  Location: [facility];  Service: Ophthalmology;  Laterality: Left;  CDE:15.20  . COLONOSCOPY  2009   [facility]-Dr. Karilyn Cota  . EYE SURGERY Bilateral 14,15   cataracts  . Fatty Tissue Excision Left 2008   [facility]-Dr. Hilda Lias arm  . right ankle Right    fx    There were no vitals filed for this visit.      Subjective Assessment - 06/08/16 1526    Subjective Patient arrives today in good spirits today reporting she has tried to do a little bit more at home    Pertinent History spondylolisthesis with lumbar fusion done 10/17, anxiety, hypothyroidism   Patient Stated Goals walk better, improve strength    Currently in  Pain? No/denies                         [facility] Adult PT Treatment/Exercise - 06/08/16 0001      Exercises   Exercises Knee/Hip     Knee/Hip Exercises: Stretches   Active Hamstring Stretch Both;3 reps;Other (comment)  45 seconds    Gastroc Stretch Both;2 reps;Other (comment)  45 seconds      Knee/Hip Exercises: Supine   Bridges Both;1 set;10 reps     Knee/Hip Exercises: Sidelying   Hip ABduction Both;1 set;10 reps           PWR [facility]) - 06/08/16 1541    PWR! Up 1x10 ball and target    PWR! Rock 2x10 with targets    PWR! Twist 2x10, cues for form    PWR! Step 2x10, hurdles and cones   Comments all in stting, HEP              PT Education - 06/08/16 1559    Education provided Yes   Education Details review of initial eval/goals, HEP assigned    Person(s) Educated Patient;Child(ren)   Methods Explanation;Demonstration;Handout   Comprehension Verbalized understanding;Returned demonstration;Need further instruction          PT Short Term Goals - 06/01/16 1453  PT SHORT TERM GOAL #1   Title Patient and family to be able to verbally state the importance of regular physical activity in managing Parkinsonian symptoms and maintaining high level of function    Time 4   Period Weeks   Status New     PT SHORT TERM GOAL #2   Title Patient to be participatory in regular walking program of at least 10 continuous minutes in duration, twice a day, 5 days per week in order to improve functional activity tolerance and establish routine of regular physical activity    Time 4   Period Weeks   Status New     PT SHORT TERM GOAL #3   Title Patient to be able to state 5/5 factors in order to improve safety at home and in community to assist in preventing fall and fall related injury    Time 4   Period Weeks   Status New     PT SHORT TERM GOAL #4   Title Patient to demonstrate improved B hamstring and gastroc muscle stiffness as evidenced by ability to  consistently achieve heel strike and TKE bilaterally and consistently during gait cycle    Time 4   Period Weeks   Status New     PT SHORT TERM GOAL #5   Title Patient to be independent in correctly and consistently performing appropriate HEP, to be updated weekly    Time 1   Period Weeks   Status New           PT Long Term Goals - 06/01/16 1456      PT LONG TERM GOAL #1   Title Patient to demonstrate an improvement of 1.5 MMT grades in all tested muscle groups in order to improve gait and general mobility   Time 8   Period Weeks   Status New     PT LONG TERM GOAL #2   Title Patient to be able to ambulate at least 534ft during with no device in order to demonstrate improved mobility and access to home and community    Time 8   Period Weeks   Status New     PT LONG TERM GOAL #3   Title Patient to be able to complete standard TUG in no more than 13 seconds, as well as manual and cognitive TUG tests in no more than 15 seconds, with no device in order to show improved balance and dual tasking ability    Time 8   Period Weeks   Status New     PT LONG TERM GOAL #4   Title Patient to be able to complete retro-gait with consistent improvement in step lengths, posture, and with elimination of posterior LOB with activity in order to show improved balance and overall reduced fall risk    Time 8   Period Weeks   Status New     PT LONG TERM GOAL #5   Title Patient to be participatory in regular aerobic exercise program at least 4-5 days per week in order to maintain functional gains and assist in managing Parkinsonian symptoms moving forwards    Time 8   Period Weeks   Status New               Plan - 06/08/16 1600    Clinical Impression Statement Began session by reviewing initial eval/goals, then proceeded with functional exercises and stretches today. Included functional stretching and strengthening today, then continued to introduced seated PWR exercises as able  to perform today. Assigned HEP; patient motivated to participate and actually requesting increase in frequency for PT today as well. Patient appears much more motivated today and in much better spirits in general.    Rehab Potential Fair   Clinical Impairments Affecting Rehab Potential (+) supportive family, high PLOF; (-) apparent denial of Parkinsonian diagnosis, recent problems with health, significant decline in functional after surgery last year    PT Frequency 2x / week   PT Duration 4 weeks   PT Treatment/Interventions ADLs/Self Care Home Management;Biofeedback;DME Instruction;Gait training;Stair training;Functional mobility training;Therapeutic activities;Therapeutic exercise;Balance training;Neuromuscular re-education;Patient/family education;Orthotic Fit/Training;Manual techniques;Passive range of motion;Energy conservation;Taping   PT Next Visit Plan continue general functional strengthening and PWR program training, balance, dual tasking    PT Home Exercise Plan 5/21: HS stretch, gastroc stretch, bridges, seated PWR    Consulted and Agree with Plan of Care Patient      Patient will benefit from skilled therapeutic intervention in order to improve the following deficits and impairments:  Abnormal gait, Improper body mechanics, Decreased coordination, Decreased mobility, Impaired tone, Postural dysfunction, Decreased activity tolerance, Decreased range of motion, Decreased strength, Decreased balance, Decreased safety awareness, Difficulty walking  Visit Diagnosis: Unsteadiness on feet  Other abnormalities of gait and mobility  Other lack of coordination  Muscle weakness (generalized)  Other symptoms and signs involving the musculoskeletal system     Problem List Patient Active Problem List   Diagnosis Date Noted  . Spondylolisthesis of lumbar region 10/31/2015  . HPV test positive 05/17/2013  . Cataract 05/11/2012    Nedra HaiKristen Ed Rayson PT, DPT [phone]  [facility] [address] [place], [place], [address] Phone: [phone]   Fax:  [phone]  Name: Julie Farrell MRN: [id number] Date of Birth: 1945-02-24

## 2016-06-08 NOTE — Patient Instructions (Signed)
   HAMSTRING STRETCH WITH TOWEL  While lying down on your back, hook a towel or strap under  your foot and draw up your leg until a stretch is felt behind your knee.  Keep your knee in a straightened position during the stretch.  Hold for at least 60 seconds.   Repeat 3 times each leg, at least twice a day.    SEATED CALF STRETCH - GASTROC  While sitting, use a towel or other strap looped around your foot. Gently pull your ankle back until a stretch is felt along the back of your lower leg.  Hold for at least 60 seconds.  Repeat 3 times each leg, twice a day.     BRIDGING  While lying on your back, tighten your lower abdominals, squeeze your buttocks and then raise your buttocks off the floor/bed as creating a "Bridge" with your body.   Repeat 10-15 times, twice a day.   SEATED PWR EXERCISES  (see separate handout)  Repeat 10 each, 2-3 times per day.   STOP if you have low back pain and let us know next time you come in.

## 2016-06-10 DIAGNOSIS — E063 Autoimmune thyroiditis: Secondary | ICD-10-CM | POA: Diagnosis not present

## 2016-06-10 DIAGNOSIS — R39198 Other difficulties with micturition: Secondary | ICD-10-CM | POA: Diagnosis not present

## 2016-06-10 DIAGNOSIS — Z681 Body mass index (BMI) 19 or less, adult: Secondary | ICD-10-CM | POA: Diagnosis not present

## 2016-06-10 DIAGNOSIS — E782 Mixed hyperlipidemia: Secondary | ICD-10-CM | POA: Diagnosis not present

## 2016-06-10 DIAGNOSIS — N390 Urinary tract infection, site not specified: Secondary | ICD-10-CM | POA: Diagnosis not present

## 2016-06-10 DIAGNOSIS — F419 Anxiety disorder, unspecified: Secondary | ICD-10-CM | POA: Diagnosis not present

## 2016-06-10 DIAGNOSIS — N342 Other urethritis: Secondary | ICD-10-CM | POA: Diagnosis not present

## 2016-06-10 DIAGNOSIS — R350 Frequency of micturition: Secondary | ICD-10-CM | POA: Diagnosis not present

## 2016-06-10 DIAGNOSIS — I10 Essential (primary) hypertension: Secondary | ICD-10-CM | POA: Diagnosis not present

## 2016-06-11 ENCOUNTER — Ambulatory Visit
Admission: RE | Admit: 2016-06-11 | Discharge: 2016-06-11 | Disposition: A | Discharge status: Home / Self Care | Payer: Medicare HMO | Source: Ambulatory Visit | Attending: Diagnostic Neuroimaging | Admitting: Diagnostic Neuroimaging

## 2016-06-11 DIAGNOSIS — R498 Other voice and resonance disorders: Secondary | ICD-10-CM | POA: Diagnosis not present

## 2016-06-11 DIAGNOSIS — H26491 Other secondary cataract, right eye: Secondary | ICD-10-CM | POA: Diagnosis not present

## 2016-06-11 DIAGNOSIS — H26492 Other secondary cataract, left eye: Secondary | ICD-10-CM | POA: Diagnosis not present

## 2016-06-11 DIAGNOSIS — R269 Unspecified abnormalities of gait and mobility: Secondary | ICD-10-CM | POA: Diagnosis not present

## 2016-06-11 DIAGNOSIS — Z961 Presence of intraocular lens: Secondary | ICD-10-CM | POA: Diagnosis not present

## 2016-06-12 ENCOUNTER — Telehealth (HOSPITAL_COMMUNITY): Payer: Self-pay

## 2016-06-12 ENCOUNTER — Ambulatory Visit (HOSPITAL_COMMUNITY): Payer: Medicare HMO

## 2016-06-12 NOTE — Telephone Encounter (Signed)
No show, called and left message regarding missed apt today.  Included next apt date and time with contact info given.  [address]Solmon Bohr, LPTA; CBIS [phone]

## 2016-06-17 ENCOUNTER — Ambulatory Visit (HOSPITAL_COMMUNITY): Payer: Medicare HMO | Admitting: Physical Therapy

## 2016-06-17 DIAGNOSIS — R29898 Other symptoms and signs involving the musculoskeletal system: Secondary | ICD-10-CM | POA: Diagnosis not present

## 2016-06-17 DIAGNOSIS — R2681 Unsteadiness on feet: Secondary | ICD-10-CM | POA: Diagnosis not present

## 2016-06-17 DIAGNOSIS — R278 Other lack of coordination: Secondary | ICD-10-CM

## 2016-06-17 DIAGNOSIS — R2689 Other abnormalities of gait and mobility: Secondary | ICD-10-CM | POA: Diagnosis not present

## 2016-06-17 DIAGNOSIS — M6281 Muscle weakness (generalized): Secondary | ICD-10-CM

## 2016-06-17 NOTE — Patient Instructions (Signed)
   TANDEM STANCE BALANCE (DO THIS AT THE KITCHEN COUNTER FOR SAFETY)  Stand and balnace in tandem stance. Hold this position for 10-15 seconds.  Switch sides and repeat 3 times each side, 1-2 times per day.     Step Over  Start standing to the side of an object.  Step over the object, shift weight onto foot, and then return to starting position.    Perform at a counter for safety and holding on with both hands.  Progress to only fingertip touch and then to no hands.  Perform 10x and then repeat activity stepping the other direction.  Repeat 1-2 times per day.

## 2016-06-17 NOTE — Therapy (Signed)
[facility] [address] [place], [place], [address] Phone: [phone]   Fax:  [phone]  Physical Therapy Treatment  Patient Details  Name: Julie Farrell MRN: [id number] Date of Birth: 1945/08/15 Referring Provider: Joycelyn Schmid   Encounter Date: 06/17/2016      PT End of Session - 06/17/16 1701    Visit Number 3   Number of Visits 9   Date for PT Re-Evaluation 06/29/16   Authorization Type [employer] H[employer]    Authorization Time Period 06/01/16 to 08/01/16   Authorization - Visit Number 3   Authorization - Number of Visits 10   PT Start Time 1602   PT Stop Time 1645   PT Time Calculation (min) 43 min   Activity Tolerance Patient tolerated treatment well   Behavior During Therapy [facility] for tasks assessed/performed      Past Medical History:  Diagnosis Date  . Abnormal pap   . Anxiety   . DDD (degenerative disc disease), lumbar   . Hemorrhoids   . HPV test positive 05/17/2013  . Hyperlipidemia   . Hypertension   . Hypothyroidism   . Thyroid disease     Past Surgical History:  Procedure Laterality Date  . BACK SURGERY  10/2015   fusion L4-L 5, Dr Lovell Sheehan  . CATARACT EXTRACTION W/PHACO Left 05/16/2012   Procedure: CATARACT EXTRACTION PHACO AND INTRAOCULAR LENS PLACEMENT (IOC);  Surgeon: Gemma Payor, MD;  Location: [facility];  Service: Ophthalmology;  Laterality: Left;  CDE:15.20  . COLONOSCOPY  2009   [facility]-Dr. Karilyn Cota  . EYE SURGERY Bilateral 14,15   cataracts  . Fatty Tissue Excision Left 2008   [facility]-Dr. Hilda Lias arm  . right ankle Right    fx    There were no vitals filed for this visit.      Subjective Assessment - 06/17/16 1605    Subjective Patient arrives with her daughter today, patient is feeling a little "bleh" and off today but otherwise OK. Felt good after last session. No falls or close calls recently.   Pertinent History spondylolisthesis with lumbar fusion done 10/17, anxiety, hypothyroidism   Patient Stated Goals walk better, improve strength    Currently in Pain? No/denies                         [facility] Adult PT Treatment/Exercise - 06/17/16 0001      Knee/Hip Exercises: Stretches   Active Hamstring Stretch Both;60 seconds;2 reps   Active Hamstring Stretch Limitations supine with sheet            PWR [facility]) - 06/17/16 1613    PWR! Up 1x10 standing, ball and cones at stairs    PWR! Rock 2x10 standing at table, cone targets    PWR! Twist 2x10 standing at table, cone targets    PWR Step 2x5 standing, hurdles and cone targets    Comments standing    PWR! Up 1x5 balla nd cone    PWR! Rock 2x5 cones    PWR! Twist 2x5 cones    PWR! Step 2x5 hurdles    Comments seated for all       Tandem stance 2x10 on solid surface min guard   Gait training 2x88ft no device, focus on large symmetrical steps        PT Education - 06/17/16 1701    Education provided Yes   Education Details HEP updates    Person(s) Educated Patient;Child(ren)   Methods Explanation  Comprehension Verbalized understanding          PT Short Term Goals - 06/01/16 1453      PT SHORT TERM GOAL #1   Title Patient and family to be able to verbally state the importance of regular physical activity in managing Parkinsonian symptoms and maintaining high level of function    Time 4   Period Weeks   Status New     PT SHORT TERM GOAL #2   Title Patient to be participatory in regular walking program of at least 10 continuous minutes in duration, twice a day, 5 days per week in order to improve functional activity tolerance and establish routine of regular physical activity    Time 4   Period Weeks   Status New     PT SHORT TERM GOAL #3   Title Patient to be able to state 5/5 factors in order to improve safety at home and in community to assist in preventing fall and fall related injury    Time 4   Period Weeks   Status New     PT SHORT TERM GOAL #4   Title Patient to  demonstrate improved B hamstring and gastroc muscle stiffness as evidenced by ability to consistently achieve heel strike and TKE bilaterally and consistently during gait cycle    Time 4   Period Weeks   Status New     PT SHORT TERM GOAL #5   Title Patient to be independent in correctly and consistently performing appropriate HEP, to be updated weekly    Time 1   Period Weeks   Status New           PT Long Term Goals - 06/01/16 1456      PT LONG TERM GOAL #1   Title Patient to demonstrate an improvement of 1.5 MMT grades in all tested muscle groups in order to improve gait and general mobility   Time 8   Period Weeks   Status New     PT LONG TERM GOAL #2   Title Patient to be able to ambulate at least 543ft during with no device in order to demonstrate improved mobility and access to home and community    Time 8   Period Weeks   Status New     PT LONG TERM GOAL #3   Title Patient to be able to complete standard TUG in no more than 13 seconds, as well as manual and cognitive TUG tests in no more than 15 seconds, with no device in order to show improved balance and dual tasking ability    Time 8   Period Weeks   Status New     PT LONG TERM GOAL #4   Title Patient to be able to complete retro-gait with consistent improvement in step lengths, posture, and with elimination of posterior LOB with activity in order to show improved balance and overall reduced fall risk    Time 8   Period Weeks   Status New     PT LONG TERM GOAL #5   Title Patient to be participatory in regular aerobic exercise program at least 4-5 days per week in order to maintain functional gains and assist in managing Parkinsonian symptoms moving forwards    Time 8   Period Weeks   Status New               Plan - 06/17/16 1701    Clinical Impression Statement Patient arrives today with her daughter  reporting she is not feeling 100% but felt good after last session. Continued with functional  stretching of LEs, especially L, and also with general PWR/functional exercise program today. Patient reports HEP is going well at this time, and performance of PWR/stretches reveals that patient appears to have been fairly compliant with given exercise program thus far.    Rehab Potential Fair   Clinical Impairments Affecting Rehab Potential (+) supportive family, high PLOF; (-) apparent denial of Parkinsonian diagnosis, recent problems with health, significant decline in functional after surgery last year    PT Frequency 2x / week   PT Duration 4 weeks   PT Treatment/Interventions ADLs/Self Care Home Management;Biofeedback;DME Instruction;Gait training;Stair training;Functional mobility training;Therapeutic activities;Therapeutic exercise;Balance training;Neuromuscular re-education;Patient/family education;Orthotic Fit/Training;Manual techniques;Passive range of motion;Energy conservation;Taping   PT Next Visit Plan continue to progress seated and standing PWR, continue to progress balance. Intorduce dual tasking.    PT Home Exercise Plan 5/21: HS stretch, gastroc stretch, bridges, seated PWR    Consulted and Agree with Plan of Care Patient      Patient will benefit from skilled therapeutic intervention in order to improve the following deficits and impairments:  Abnormal gait, Improper body mechanics, Decreased coordination, Decreased mobility, Impaired tone, Postural dysfunction, Decreased activity tolerance, Decreased range of motion, Decreased strength, Decreased balance, Decreased safety awareness, Difficulty walking  Visit Diagnosis: Unsteadiness on feet  Other abnormalities of gait and mobility  Other lack of coordination  Muscle weakness (generalized)  Other symptoms and signs involving the musculoskeletal system     Problem List Patient Active Problem List   Diagnosis Date Noted  . Spondylolisthesis of lumbar region 10/31/2015  . HPV test positive 05/17/2013  . Cataract  05/11/2012    Nedra HaiKristen Natahlia Hoggard PT, DPT [phone]  [facility] [address] [place], [place], [address] Phone: [phone]   Fax:  [phone]  Name: Julie Farrell MRN: [id number] Date of Birth: 1945-09-22

## 2016-06-20 ENCOUNTER — Emergency Department (HOSPITAL_COMMUNITY): Payer: Medicare HMO

## 2016-06-20 ENCOUNTER — Emergency Department (HOSPITAL_COMMUNITY)
Admission: EM | Admit: 2016-06-20 | Discharge: 2016-06-20 | Disposition: A | Discharge status: Home / Self Care | Payer: Medicare HMO | Attending: Emergency Medicine | Admitting: Emergency Medicine

## 2016-06-20 ENCOUNTER — Encounter (HOSPITAL_COMMUNITY): Payer: Self-pay | Admitting: Emergency Medicine

## 2016-06-20 DIAGNOSIS — Z7982 Long term (current) use of aspirin: Secondary | ICD-10-CM | POA: Diagnosis not present

## 2016-06-20 DIAGNOSIS — I10 Essential (primary) hypertension: Secondary | ICD-10-CM | POA: Insufficient documentation

## 2016-06-20 DIAGNOSIS — R1030 Lower abdominal pain, unspecified: Secondary | ICD-10-CM | POA: Diagnosis not present

## 2016-06-20 DIAGNOSIS — R350 Frequency of micturition: Secondary | ICD-10-CM | POA: Insufficient documentation

## 2016-06-20 DIAGNOSIS — R300 Dysuria: Secondary | ICD-10-CM | POA: Diagnosis not present

## 2016-06-20 DIAGNOSIS — E039 Hypothyroidism, unspecified: Secondary | ICD-10-CM | POA: Insufficient documentation

## 2016-06-20 DIAGNOSIS — Z79899 Other long term (current) drug therapy: Secondary | ICD-10-CM | POA: Insufficient documentation

## 2016-06-20 DIAGNOSIS — N3281 Overactive bladder: Secondary | ICD-10-CM

## 2016-06-20 LAB — CBC
HCT: 39.9 % (ref 36.0–46.0)
HEMOGLOBIN: 13.5 g/dL (ref 12.0–15.0)
MCH: 30.9 pg (ref 26.0–34.0)
MCHC: 33.8 g/dL (ref 30.0–36.0)
MCV: 91.3 fL (ref 78.0–100.0)
Platelets: 270 10*3/uL (ref 150–400)
RBC: 4.37 MIL/uL (ref 3.87–5.11)
RDW: 12.4 % (ref 11.5–15.5)
WBC: 5 10*3/uL (ref 4.0–10.5)

## 2016-06-20 LAB — URINALYSIS, ROUTINE W REFLEX MICROSCOPIC
BILIRUBIN URINE: NEGATIVE
Bacteria, UA: NONE SEEN
Glucose, UA: NEGATIVE mg/dL
Ketones, ur: NEGATIVE mg/dL
Leukocytes, UA: NEGATIVE
Nitrite: NEGATIVE
PROTEIN: NEGATIVE mg/dL
RBC / HPF: NONE SEEN RBC/hpf (ref 0–5)
Specific Gravity, Urine: 1.001 — ABNORMAL LOW (ref 1.005–1.030)
Squamous Epithelial / LPF: NONE SEEN
WBC, UA: NONE SEEN WBC/hpf (ref 0–5)
pH: 6 (ref 5.0–8.0)

## 2016-06-20 LAB — COMPREHENSIVE METABOLIC PANEL
ALT: 17 U/L (ref 14–54)
AST: 19 U/L (ref 15–41)
Albumin: 4.5 g/dL (ref 3.5–5.0)
Alkaline Phosphatase: 66 U/L (ref 38–126)
Anion gap: 7 (ref 5–15)
BUN: 15 mg/dL (ref 6–20)
CALCIUM: 10.3 mg/dL (ref 8.9–10.3)
CHLORIDE: 106 mmol/L (ref 101–111)
CO2: 27 mmol/L (ref 22–32)
Creatinine, Ser: 0.73 mg/dL (ref 0.44–1.00)
GFR calc non Af Amer: >60 mL/min (ref >60)
Glucose, Bld: 100 mg/dL — ABNORMAL HIGH (ref 65–99)
POTASSIUM: 3.9 mmol/L (ref 3.5–5.1)
SODIUM: 140 mmol/L (ref 135–145)
Total Bilirubin: 0.9 mg/dL (ref 0.3–1.2)
Total Protein: 7.5 g/dL (ref 6.5–8.1)

## 2016-06-20 LAB — WET PREP, GENITAL
CLUE CELLS WET PREP: NONE SEEN
Sperm: NONE SEEN
TRICH WET PREP: NONE SEEN
WBC, Wet Prep HPF POC: NONE SEEN
YEAST WET PREP: NONE SEEN

## 2016-06-20 MED ORDER — OXYBUTYNIN CHLORIDE ER 5 MG PO TB24: 5.0000 mg | ORAL_TABLET | Freq: Every day | ORAL | 0 refills | Status: DC

## 2016-06-20 MED ORDER — OXYBUTYNIN CHLORIDE ER 5 MG PO TB24
5.0000 mg | ORAL_TABLET | Freq: Every day | ORAL | Status: DC
  Administered 2016-06-20: 5 mg via ORAL
  Filled 2016-06-20 (×2): qty 1

## 2016-06-20 MED ORDER — PHENAZOPYRIDINE HCL 100 MG PO TABS
200.0000 mg | ORAL_TABLET | Freq: Once | ORAL | Status: AC
  Administered 2016-06-20: 200 mg via ORAL
  Filled 2016-06-20: qty 2

## 2016-06-20 NOTE — ED Triage Notes (Signed)
Pt reports burning, pressure, urinary frequency worse over the past 24 hours.  Daughter states she just finished abx for uti with no change and new one was called in to pharmacy by pcp but did not get it filled yet.

## 2016-06-20 NOTE — ED Provider Notes (Signed)
[facility] Provider Note   CSN: [id number] Arrival date & time: 06/20/16  1106     History   Chief Complaint Chief Complaint  Patient presents with  . Dysuria    HPI Julie Farrell is a 71 y.o. female.  Pt presents to the ED today with dysuria.  Pt has had an uti that was diagnosed by her doctor.  She said she's been on an antibiotic for 10 days which has not helped.  Pt feels like she has a lot of pressure in her bladder.  She also feels like she has to urinate frequently.  The pt's doctor did call in a new abx today, but she has not gotten it filled.  She does not know the name of the current abx or the one called in.  Pt does have an appt with [place] [facility] soon.  The pharmacist here called the pharmacy where pt has been getting her meds.  Pt was on doxy on 4/13, Keflex on 4/21, Macrobid on 5/23 and Cipro was called in yesterday.      Past Medical History:  Diagnosis Date  . Abnormal pap   . Anxiety   . DDD (degenerative disc disease), lumbar   . Hemorrhoids   . HPV test positive 05/17/2013  . Hyperlipidemia   . Hypertension   . Hypothyroidism   . Thyroid disease     Patient Active Problem List   Diagnosis Date Noted  . Spondylolisthesis of lumbar region 10/31/2015  . HPV test positive 05/17/2013  . Cataract 05/11/2012    Past Surgical History:  Procedure Laterality Date  . BACK SURGERY  10/2015   fusion L4-L 5, Dr Lovell SheehanJenkins  . CATARACT EXTRACTION W/PHACO Left 05/16/2012   Procedure: CATARACT EXTRACTION PHACO AND INTRAOCULAR LENS PLACEMENT (IOC);  Surgeon: Gemma PayorKerry Hunt, MD;  Location: [facility];  Service: Ophthalmology;  Laterality: Left;  CDE:15.20  . COLONOSCOPY  2009   [facility]-Dr. Karilyn Cotaehman  . EYE SURGERY Bilateral 14,15   cataracts  . Fatty Tissue Excision Left 2008   [facility]-Dr. Hilda LiasKeeling arm  . right ankle Right    fx    OB History    Gravida Para Term Preterm AB Living   1 1       1    SAB TAB Ectopic Multiple Live Births                   Home  Medications    Prior to Admission medications   Medication Sig Start Date End Date Taking? Authorizing Provider  ALPRAZolam Prudy Feeler(XANAX) 0.5 MG tablet as needed. 03/17/16  Yes [provider]  amLODipine (NORVASC) 5 MG tablet Take 5 mg by mouth daily.  01/26/15  Yes [provider]  aspirin EC 81 MG tablet Take 81 mg by mouth daily.   Yes [provider]  carbidopa-levodopa (SINEMET IR) 25-100 MG tablet Take 1 tablet by mouth 3 (three) times daily before meals. 05/19/16  Yes Penumalli, Glenford BayleyVikram R, MD  levothyroxine (SYNTHROID, LEVOTHROID) 50 MCG tablet Take 50 mcg by mouth daily. 06/06/14  Yes [provider]  Menthol-Camphor (ICY HOT ADVANCED RELIEF) 16-11 % CREA Apply 1 application topically daily as needed (pain).   Yes [provider]  simvastatin (ZOCOR) 20 MG tablet Take 20 mg by mouth at bedtime.   Yes [provider]    Family History Family History  Problem Relation Age of Onset  . Heart attack Mother   . Heart disease Mother  CAD  . Heart disease Brother   . Heart disease Brother   . Hypertension Father   . Stroke Father     Social History Social History  Substance Use Topics  . Smoking status: Never Smoker  . Smokeless tobacco: Never Used  . Alcohol use No     Allergies   Vicodin [hydrocodone-acetaminophen]   Review of Systems Review of Systems  Genitourinary: Positive for dysuria and frequency.  All other systems reviewed and are negative.    Physical Exam Updated Vital Signs BP (!) 127/59 (BP Location: Left Arm)   Pulse 77   Temp 98 F (36.7 C) (Oral)   Resp 18   Ht 5\' 2"  (1.575 m)   Wt 45.4 kg (100 lb)   SpO2 100%   BMI 18.29 kg/m   Physical Exam  Constitutional: She is oriented to person, place, and time. She appears well-developed and well-nourished.  HENT:  Head: Normocephalic and atraumatic.  Right Ear: External ear normal.  Left Ear: External ear normal.  Nose: Nose normal.    Mouth/Throat: Oropharynx is clear and moist.  Eyes: Conjunctivae and EOM are normal. Pupils are equal, round, and reactive to light.  Neck: Normal range of motion. Neck supple.  Cardiovascular: Normal rate, regular rhythm, normal heart sounds and intact distal pulses.   Pulmonary/Chest: Effort normal and breath sounds normal.  Abdominal: Soft. Bowel sounds are normal. There is tenderness in the suprapubic area.  Genitourinary: Vagina normal.  Genitourinary Comments: I did not see or feel a defect in vagina.  No evidence of fistula, but a good speculum exam was not done due to speculum discomfort.    Musculoskeletal: Normal range of motion.  Neurological: She is alert and oriented to person, place, and time.  Skin: Skin is warm.  Psychiatric: She has a normal mood and affect. Her behavior is normal. Judgment and thought content normal.  Nursing note and vitals reviewed.    ED Treatments / Results  Labs (all labs ordered are listed, but only abnormal results are displayed) Labs Reviewed  COMPREHENSIVE METABOLIC PANEL - Abnormal; Notable for the following:       Result Value   Glucose, Bld 100 (*)    All other components within normal limits  URINALYSIS, ROUTINE W REFLEX MICROSCOPIC - Abnormal; Notable for the following:    Color, Urine STRAW (*)    Specific Gravity, Urine 1.001 (*)    Hgb urine dipstick MODERATE (*)    All other components within normal limits  URINE CULTURE  CBC    EKG  EKG Interpretation None       Radiology No results found.  Procedures Procedures (including critical care time)  Medications Ordered in ED Medications  oxybutynin (DITROPAN-XL) 24 hr tablet 5 mg (not administered)  phenazopyridine (PYRIDIUM) tablet 200 mg (200 mg Oral Given 06/20/16 1228)     Initial Impression / Assessment and Plan / ED Course  I have reviewed the triage vital signs and the nursing notes.  Pertinent labs & imaging results that were available during my care of  the patient were reviewed by me and considered in my medical decision making (see chart for details).    UA normal.  Pt will be started on ditropan XL to help with overactive bladder.  Pt told of the gas seen on Ct scan.  She is encouraged to f/u with her gyn doctor for a more thorough exam.    Pt knows to return if worse.  Final Clinical Impressions(s) /  ED Diagnoses   Final diagnoses:  Urinary frequency  Overactive bladder    New Prescriptions New Prescriptions   No medications on file     Jacalyn Lefevre, MD 06/20/16 [phone]

## 2016-06-20 NOTE — ED Triage Notes (Signed)
Daughter reports "kidney problems" going on for awhile- supposed to see a kidney specialist but daughter states she believes she cannot wait  Robbie LisBelmont is PCP

## 2016-06-21 LAB — URINE CULTURE: Culture: NO GROWTH

## 2016-06-22 ENCOUNTER — Ambulatory Visit (HOSPITAL_COMMUNITY): Payer: Medicare HMO | Attending: Diagnostic Neuroimaging | Admitting: Physical Therapy

## 2016-06-22 ENCOUNTER — Telehealth (HOSPITAL_COMMUNITY): Payer: Self-pay | Admitting: Physical Therapy

## 2016-06-22 DIAGNOSIS — R278 Other lack of coordination: Secondary | ICD-10-CM | POA: Insufficient documentation

## 2016-06-22 DIAGNOSIS — R2689 Other abnormalities of gait and mobility: Secondary | ICD-10-CM | POA: Insufficient documentation

## 2016-06-22 DIAGNOSIS — R2681 Unsteadiness on feet: Secondary | ICD-10-CM | POA: Insufficient documentation

## 2016-06-22 DIAGNOSIS — R29898 Other symptoms and signs involving the musculoskeletal system: Secondary | ICD-10-CM | POA: Insufficient documentation

## 2016-06-22 DIAGNOSIS — M6281 Muscle weakness (generalized): Secondary | ICD-10-CM | POA: Insufficient documentation

## 2016-06-22 LAB — GC/CHLAMYDIA PROBE AMP (~~LOC~~) NOT AT ARMC
CHLAMYDIA, DNA PROBE: NEGATIVE
Neisseria Gonorrhea: NEGATIVE

## 2016-06-22 NOTE — Telephone Encounter (Signed)
Pt did not show for appointment.  Called first time and someone answered but then hang up.  Called again and went to voicemail.  Left message regarding missed appointment and reminder for next appointment Monday 6/11 at 2:30 Lurena Nidamy B Agustina Witzke, PTA/CLT [phone]

## 2016-06-26 DIAGNOSIS — H26491 Other secondary cataract, right eye: Secondary | ICD-10-CM | POA: Diagnosis not present

## 2016-06-29 ENCOUNTER — Ambulatory Visit (HOSPITAL_COMMUNITY): Payer: Medicare HMO | Admitting: Physical Therapy

## 2016-06-29 DIAGNOSIS — R2689 Other abnormalities of gait and mobility: Secondary | ICD-10-CM

## 2016-06-29 DIAGNOSIS — M6281 Muscle weakness (generalized): Secondary | ICD-10-CM

## 2016-06-29 DIAGNOSIS — R278 Other lack of coordination: Secondary | ICD-10-CM | POA: Diagnosis not present

## 2016-06-29 DIAGNOSIS — R2681 Unsteadiness on feet: Secondary | ICD-10-CM | POA: Diagnosis not present

## 2016-06-29 DIAGNOSIS — R29898 Other symptoms and signs involving the musculoskeletal system: Secondary | ICD-10-CM

## 2016-06-29 NOTE — Therapy (Signed)
[place], [place], [address] Phone: [phone]   Fax:  [phone]  Physical Therapy Treatment (Re-Assessment)  Patient Details  Name: Julie Farrell MRN: [id number] Date of Birth: Dec 15, 1945 Referring Provider: Andrey Spearman   Encounter Date: 06/29/2016      PT End of Session - 06/29/16 1514    Visit Number 4   Number of Visits 9   Date for PT Re-Evaluation 08/03/16   Authorization Type [employer] (G-codes done 4th session)   Authorization Time Period 06/01/16 to 08/01/16   Authorization - Visit Number 4   Authorization - Number of Visits 14   PT Start Time 5170   PT Stop Time 1515   PT Time Calculation (min) 39 min   Activity Tolerance Patient tolerated treatment well   Behavior During Therapy [facility] for tasks assessed/performed      Past Medical History:  Diagnosis Date  . Abnormal pap   . Anxiety   . DDD (degenerative disc disease), lumbar   . Hemorrhoids   . HPV test positive 05/17/2013  . Hyperlipidemia   . Hypertension   . Hypothyroidism   . Thyroid disease     Past Surgical History:  Procedure Laterality Date  . BACK SURGERY  10/2015   fusion L4-L 5, Dr Arnoldo Morale  . CATARACT EXTRACTION W/PHACO Left 05/16/2012   Procedure: CATARACT EXTRACTION PHACO AND INTRAOCULAR LENS PLACEMENT (IOC);  Surgeon: Tonny Branch, MD;  Location: [facility];  Service: Ophthalmology;  Laterality: Left;  CDE:15.20  . COLONOSCOPY  2009   [facility]-Dr. Laural Golden  . EYE SURGERY Bilateral 14,15   cataracts  . Fatty Tissue Excision Left 2008   [facility]-Dr. Luna Glasgow arm  . right ankle Right    fx    There were no vitals filed for this visit.      Subjective Assessment - 06/29/16 1439    Subjective Patient arrives stating she was recently in the ED due to kidney issues, she is seeing a specialist at the end of the month. She states she is getting around better than she used to, walking is still hard for her however. Her  daughter states that she has not noticed anything patient struggles with. Patient continues to insiist her tremor is due to anxiety.    Pertinent History spondylolisthesis with lumbar fusion done 10/17, anxiety, hypothyroidism   Patient Stated Goals walk better, improve strength    Currently in Pain? No/denies            [facility] PT Assessment - 06/29/16 0001      Strength   Right Hip Flexion 4+/5   Right Hip ABduction 4+/5   Left Hip Flexion 4+/5   Left Hip ABduction 4+/5   Right Knee Flexion 4+/5   Right Knee Extension 5/5   Left Knee Flexion 4+/5   Left Knee Extension 5/5   Right Ankle Dorsiflexion 5/5   Left Ankle Dorsiflexion 5/5     6 minute walk test results    Aerobic Endurance Distance Walked 482   Endurance additional comments 3MWT      Timed Up and Go Test   Normal TUG (seconds) 11.4   Manual TUG (seconds) 19.2   Cognitive TUG (seconds) 13.7     High Level Balance   High Level Balance Comments improved retrogait, no LOB but shuffling pattern  Balance Exercises - 06/29/16 1509      Balance Exercises: Standing   Tandem Stance Eyes open;Foam/compliant surface;3 reps;20 secs   Retro Gait 2 reps;Other (comment)  x49f cues for steps    Other Standing Exercises toe taps on 4 inch box 1x10           PT Education - 06/29/16 1514    Education provided Yes   Education Details POC, progress moving forward    Person(s) Educated Patient   Methods Explanation   Comprehension Verbalized understanding          PT Short Term Goals - 06/29/16 1456      PT SHORT TERM GOAL #1   Title Patient and family to be able to verbally state the importance of regular physical activity in managing Parkinsonian symptoms and maintaining high level of function    Baseline 6/11- partially met    Time 4   Period Weeks   Status Achieved     PT SHORT TERM GOAL #2   Title Patient to be participatory in regular walking program of at  least 10 continuous minutes in duration, twice a day, 5 days per week in order to improve functional activity tolerance and establish routine of regular physical activity    Baseline 6/11- 30 min, twice a day, 7 days a week    Time 4   Period Weeks   Status Achieved     PT SHORT TERM GOAL #3   Title Patient to be able to state 5/5 factors in order to improve safety at home and in community to assist in preventing fall and fall related injury    Baseline 6/11- 2/5    Time 4   Period Weeks   Status On-going     PT SHORT TERM GOAL #4   Title Patient to demonstrate improved B hamstring and gastroc muscle stiffness as evidenced by ability to consistently achieve heel strike and TKE bilaterally and consistently during gait cycle    Baseline 6/11- ongoing stiffness/gait impairment    Time 4   Period Weeks   Status On-going     PT SHORT TERM GOAL #5   Title Patient to be independent in correctly and consistently performing appropriate HEP, to be updated weekly    Baseline 6/11- compliant    Time 1   Period Weeks   Status Achieved           PT Long Term Goals - 06/29/16 1459      PT LONG TERM GOAL #1   Title Patient to demonstrate an improvement of 1.5 MMT grades in all tested muscle groups in order to improve gait and general mobility   Baseline 6/11- vast improvement    Time 8   Period Weeks   Status Achieved     PT LONG TERM GOAL #2   Title Patient to be able to ambulate at least 5535fduring 3MWT with no device in order to demonstrate improved mobility and access to home and community    Baseline 6/11- 4822f  Time 8   Period Weeks   Status On-going     PT LONG TERM GOAL #3   Title Patient to be able to complete standard TUG in no more than 13 seconds, as well as manual and cognitive TUG tests in no more than 15 seconds, with no device in order to show improved balance and dual tasking ability    Baseline 6/11- 11.4 normal, manual 19.2, cognitive 13.7  Time 8    Period Weeks   Status Partially Met     PT LONG TERM GOAL #4   Title Patient to be able to complete retro-gait with consistent improvement in step lengths, posture, and with elimination of posterior LOB with activity in order to show improved balance and overall reduced fall risk    Baseline 2022-07-07- no LOB but shuffling pattern    Time 8   Period Weeks   Status On-going     PT LONG TERM GOAL #5   Title Patient to be participatory in regular aerobic exercise program at least 4-5 days per week in order to maintain functional gains and assist in managing Parkinsonian symptoms moving forwards    Baseline 2022-07-07- reports she is planning to start [facility] class at senior center    Time 8   Period Weeks   Status On-going               Plan - 07/06/16 [place] performed today. Patient has made excellent progress with skilled PT services, showing general improvement in all objective measures performed this session; she also shows excellent motivation to participate in independent exercise program and reports she plans to start going to group classes at senior center soon. Patient would, however benefit from ongoing skilled PT services to address functional impairments; however due to financial limitations she reports she is limited to how many more sessions she can afford. Recommend short extension of skilled PT services to address functional deficits and provide further guidance for independent exercise routine.    Rehab Potential Fair   Clinical Impairments Affecting Rehab Potential (+) supportive family, high PLOF; (-) apparent denial of Parkinsonian diagnosis, recent problems with health, significant decline in functional after surgery last year    PT Frequency Other (comment)  2 more sessions    PT Duration Other (comment)  2 more weeks    PT Treatment/Interventions ADLs/Self Care Home Management;Biofeedback;DME Instruction;Gait training;Stair  training;Functional mobility training;Therapeutic activities;Therapeutic exercise;Balance training;Neuromuscular re-education;Patient/family education;Orthotic Fit/Training;Manual techniques;Passive range of motion;Energy conservation;Taping   PT Next Visit Plan continue to progress, update HPE. DC in 2 sessions    PT Home Exercise Plan 5/21: HS stretch, gastroc stretch, bridges, seated PWR    Consulted and Agree with Plan of Care Patient      Patient will benefit from skilled therapeutic intervention in order to improve the following deficits and impairments:  Abnormal gait, Improper body mechanics, Decreased coordination, Decreased mobility, Impaired tone, Postural dysfunction, Decreased activity tolerance, Decreased range of motion, Decreased strength, Decreased balance, Decreased safety awareness, Difficulty walking  Visit Diagnosis: Unsteadiness on feet  Other abnormalities of gait and mobility  Other lack of coordination  Muscle weakness (generalized)  Other symptoms and signs involving the musculoskeletal system       G-Codes - 07-06-16 1516    Functional Assessment Tool Used (Outpatient Only) Based on skilled clinical assessment of gait, flexibility, balance, strength, fall risk    Functional Limitation Mobility: Walking and moving around   Mobility: Walking and Moving Around Current Status (V4360) At least 20 percent but less than 40 percent impaired, limited or restricted   Mobility: Walking and Moving Around Goal Status [id number]) At least 20 percent but less than 40 percent impaired, limited or restricted      Problem List Patient Active Problem List   Diagnosis Date Noted  . Spondylolisthesis of lumbar region 10/31/2015  . HPV test positive 05/17/2013  . Cataract 05/11/2012  Deniece Ree PT, DPT [phone]  [place] [address] [place], [place], [address] Phone: [phone]   Fax:  [phone]  Name: Julie Farrell MRN: [id number] Date of Birth: November 29, 1945

## 2016-07-02 DIAGNOSIS — H26491 Other secondary cataract, right eye: Secondary | ICD-10-CM | POA: Diagnosis not present

## 2016-07-03 DIAGNOSIS — E782 Mixed hyperlipidemia: Secondary | ICD-10-CM | POA: Diagnosis not present

## 2016-07-03 DIAGNOSIS — F419 Anxiety disorder, unspecified: Secondary | ICD-10-CM | POA: Diagnosis not present

## 2016-07-03 DIAGNOSIS — I10 Essential (primary) hypertension: Secondary | ICD-10-CM | POA: Diagnosis not present

## 2016-07-03 DIAGNOSIS — N342 Other urethritis: Secondary | ICD-10-CM | POA: Diagnosis not present

## 2016-07-03 DIAGNOSIS — F329 Major depressive disorder, single episode, unspecified: Secondary | ICD-10-CM | POA: Diagnosis not present

## 2016-07-03 DIAGNOSIS — Z681 Body mass index (BMI) 19 or less, adult: Secondary | ICD-10-CM | POA: Diagnosis not present

## 2016-07-03 DIAGNOSIS — E063 Autoimmune thyroiditis: Secondary | ICD-10-CM | POA: Diagnosis not present

## 2016-07-06 ENCOUNTER — Ambulatory Visit (HOSPITAL_COMMUNITY): Payer: Medicare HMO | Admitting: Physical Therapy

## 2016-07-06 ENCOUNTER — Telehealth (HOSPITAL_COMMUNITY): Payer: Self-pay | Admitting: Physical Therapy

## 2016-07-06 ENCOUNTER — Emergency Department (HOSPITAL_COMMUNITY): Payer: Medicare HMO

## 2016-07-06 ENCOUNTER — Encounter (HOSPITAL_COMMUNITY): Payer: Self-pay | Admitting: Emergency Medicine

## 2016-07-06 ENCOUNTER — Emergency Department (HOSPITAL_COMMUNITY)
Admission: EM | Admit: 2016-07-06 | Discharge: 2016-07-06 | Disposition: A | Discharge status: Home / Self Care | Payer: Medicare HMO | Attending: Emergency Medicine | Admitting: Emergency Medicine

## 2016-07-06 DIAGNOSIS — Y93E2 Activity, laundry: Secondary | ICD-10-CM | POA: Insufficient documentation

## 2016-07-06 DIAGNOSIS — E039 Hypothyroidism, unspecified: Secondary | ICD-10-CM | POA: Diagnosis not present

## 2016-07-06 DIAGNOSIS — W19XXXA Unspecified fall, initial encounter: Secondary | ICD-10-CM | POA: Diagnosis not present

## 2016-07-06 DIAGNOSIS — M545 Low back pain: Secondary | ICD-10-CM | POA: Diagnosis not present

## 2016-07-06 DIAGNOSIS — Y999 Unspecified external cause status: Secondary | ICD-10-CM | POA: Insufficient documentation

## 2016-07-06 DIAGNOSIS — I10 Essential (primary) hypertension: Secondary | ICD-10-CM | POA: Diagnosis not present

## 2016-07-06 DIAGNOSIS — M25551 Pain in right hip: Secondary | ICD-10-CM | POA: Diagnosis not present

## 2016-07-06 DIAGNOSIS — S79911A Unspecified injury of right hip, initial encounter: Secondary | ICD-10-CM | POA: Diagnosis not present

## 2016-07-06 DIAGNOSIS — Y92009 Unspecified place in unspecified non-institutional (private) residence as the place of occurrence of the external cause: Secondary | ICD-10-CM | POA: Insufficient documentation

## 2016-07-06 NOTE — ED Provider Notes (Signed)
[facility] Provider Note   CSN: [id number] Arrival date & time: 07/06/16  1527     History   Chief Complaint Chief Complaint  Patient presents with  . Fall    HPI Julie Farrell is a 71 y.o. female.  HPI  This 71 year old female who presents today after mechanical fall at home today. She states she is doing laundry and went to get her cane when she over balance and fell onto her right hip. She was able to crawl across the floor but is having pain in her right hip and low back area. She states it has improved in the time she has been here. She has placed weight upon it. She denies any head injury, neck pain, or anticoagulation.  Past Medical History:  Diagnosis Date  . Abnormal pap   . Anxiety   . DDD (degenerative disc disease), lumbar   . Hemorrhoids   . HPV test positive 05/17/2013  . Hyperlipidemia   . Hypertension   . Hypothyroidism   . Thyroid disease     Patient Active Problem List   Diagnosis Date Noted  . Spondylolisthesis of lumbar region 10/31/2015  . HPV test positive 05/17/2013  . Cataract 05/11/2012    Past Surgical History:  Procedure Laterality Date  . BACK SURGERY  10/2015   fusion L4-L 5, Dr Julie SheehanJenkins  . CATARACT EXTRACTION W/PHACO Left 05/16/2012   Procedure: CATARACT EXTRACTION PHACO AND INTRAOCULAR LENS PLACEMENT (IOC);  Surgeon: Julie PayorKerry Hunt, MD;  Location: [facility];  Service: Ophthalmology;  Laterality: Left;  CDE:15.20  . COLONOSCOPY  2009   [facility]-Dr. Karilyn Farrell  . EYE SURGERY Bilateral 14,15   cataracts  . Fatty Tissue Excision Left 2008   [facility]-Dr. Hilda Farrell arm  . right ankle Right    fx    OB History    Gravida Para Term Preterm AB Living   1 1       1    SAB TAB Ectopic Multiple Live Births                   Home Medications    Prior to Admission medications   Medication Sig Start Date End Date Taking? Authorizing Provider  ALPRAZolam Prudy Feeler(XANAX) 0.5 MG tablet as needed. 03/17/16   [provider]  amLODipine (NORVASC) 5 MG  tablet Take 5 mg by mouth daily.  01/26/15   [provider]  aspirin EC 81 MG tablet Take 81 mg by mouth daily.    [provider]  carbidopa-levodopa (SINEMET IR) 25-100 MG tablet Take 1 tablet by mouth 3 (three) times daily before meals. 05/19/16   Farrell, Glenford BayleyVikram R, MD  levothyroxine (SYNTHROID, LEVOTHROID) 50 MCG tablet Take 50 mcg by mouth daily. 06/06/14   [provider]  Menthol-Camphor (ICY HOT ADVANCED RELIEF) 16-11 % CREA Apply 1 application topically daily as needed (pain).    [provider]  oxybutynin (DITROPAN XL) 5 MG 24 hr tablet Take 1 tablet (5 mg total) by mouth at bedtime. 06/20/16   Julie Farrell, Julie, MD  simvastatin (ZOCOR) 20 MG tablet Take 20 mg by mouth at bedtime.    [provider]    Family History Family History  Problem Relation Age of Onset  . Heart attack Mother   . Heart disease Mother        CAD  . Heart disease Brother   . Heart disease Brother   . Hypertension Father   . Stroke Father     Social History Social  History  Substance Use Topics  . Smoking status: Never Smoker  . Smokeless tobacco: Never Used  . Alcohol use No     Allergies   Vicodin [hydrocodone-acetaminophen]   Review of Systems Review of Systems  All other systems reviewed and are negative.    Physical Exam Updated Vital Signs BP (!) 145/66 (BP Location: Right Arm)   Pulse 87   Temp 98.1 F (36.7 C) (Oral)   Resp 18   SpO2 100%   Physical Exam  Constitutional: She is oriented to person, place, and time. She appears well-developed and well-nourished. No distress.  HENT:  Head: Normocephalic and atraumatic.  Right Ear: External ear normal.  Left Ear: External ear normal.  Nose: Nose normal.  Eyes: Conjunctivae and EOM are normal. Pupils are equal, round, and reactive to light.  Neck: Normal range of motion. Neck supple.  Pulmonary/Chest: Effort normal.  Musculoskeletal: Normal range of motion. She exhibits no tenderness  or deformity.  Bilateral hips palpated without tenderness. Patient has full active range of motion of both hips. There are no contusions on her lower extremities. I examined her but low back there are no signs of trauma. There is no tenderness palpation over the cervical, thoracic, or lumbar spine. Patient is ambulated room without difficulty.  Neurological: She is alert and oriented to person, place, and time. She exhibits normal muscle tone. Coordination normal.  Skin: Skin is warm and dry. Capillary refill takes less than 2 seconds.  Psychiatric: She has a normal mood and affect. Her behavior is normal. Thought content normal.  Nursing note and vitals reviewed.    ED Treatments / Results  Labs (all labs ordered are listed, but only abnormal results are displayed) Labs Reviewed - No data to display  EKG  EKG Interpretation None       Radiology Dg Lumbar Spine 2-3 Views  Result Date: 07/06/2016 CLINICAL DATA:  Low back pain due to a slip and fall doing laundry today. Initial encounter. EXAM: LUMBAR SPINE - 2-3 VIEW COMPARISON:  CT abdomen and pelvis 06/20/2016. Plain films lumbar spine performed at [facility] 02/21/2016. FINDINGS: No fracture is identified. The patient is status post L4-5 fusion. Grade 1 anterolisthesis L4 on L5 is unchanged. Alignment is otherwise unremarkable. Paraspinous structures appear normal. IMPRESSION: No acute abnormality. Status post L4-5 fusion. Electronically Signed   By: Drusilla Kanner M.D.   On: 07/06/2016 16:10   Dg Hip Unilat  With Pelvis 2-3 Views Right  Result Date: 07/06/2016 CLINICAL DATA:  Status post fall onto right side, with right hip pain. Initial encounter. EXAM: DG HIP (WITH OR WITHOUT PELVIS) 2-3V RIGHT COMPARISON:  Right hip radiographs from 09/09/2015 FINDINGS: There is no evidence of fracture or dislocation. Both femoral heads are seated normally within their respective acetabula. The proximal right femur appears  intact. No significant degenerative change is appreciated. The sacroiliac joints are unremarkable in appearance. Lumbar spinal fusion hardware is partially imaged. The visualized bowel gas pattern is grossly unremarkable in appearance. IMPRESSION: No evidence of fracture or dislocation. Electronically Signed   By: Roanna Raider M.D.   On: 07/06/2016 16:05    Procedures Procedures (including critical care time)  Medications Ordered in ED Medications - No data to display   Initial Impression / Assessment and Plan / ED Course  I have reviewed the triage vital signs and the nursing notes.  Pertinent labs & imaging results that were available during my care of the patient were reviewed by me  and considered in my medical decision making (see chart for details).       Final Clinical Impressions(s) / ED Diagnoses   Final diagnoses:  Fall, initial encounter  Right hip pain    New Prescriptions New Prescriptions   No medications on file     Margarita Grizzle, MD 07/06/16 1800

## 2016-07-06 NOTE — ED Triage Notes (Signed)
PT states she was getting clothes out of the washing machine and slipped and fell onto her right side. PT c/o right sided lower back pain and right hip pain since fall today.

## 2016-07-06 NOTE — Telephone Encounter (Signed)
Patient a no-show; called and spoke to daughter, who apologizes as patient was sick this morning and they forgot to call, they plan to be at next appointment next week.  Nedra HaiKristen Unger PT, DPT [phone]

## 2016-07-13 ENCOUNTER — Ambulatory Visit (HOSPITAL_COMMUNITY): Payer: Medicare HMO | Admitting: Physical Therapy

## 2016-07-13 ENCOUNTER — Telehealth (HOSPITAL_COMMUNITY): Payer: Self-pay | Admitting: Physical Therapy

## 2016-07-13 NOTE — Telephone Encounter (Signed)
Patient had her 2nd consecutive no-show. Called and left message asking patient to call us if she is interested in continuing with PT, also left details regarding no-show policy/she may only schedule one appt at a time.  Nedra HaiKristen Unger PT, DPT [phone]

## 2016-07-17 DIAGNOSIS — R3915 Urgency of urination: Secondary | ICD-10-CM | POA: Diagnosis not present

## 2016-07-20 ENCOUNTER — Ambulatory Visit (INDEPENDENT_AMBULATORY_CARE_PROVIDER_SITE_OTHER): Payer: Medicare HMO | Admitting: Diagnostic Neuroimaging

## 2016-07-20 ENCOUNTER — Encounter: Payer: Self-pay | Admitting: Diagnostic Neuroimaging

## 2016-07-20 ENCOUNTER — Encounter (HOSPITAL_COMMUNITY): Payer: Medicare HMO | Admitting: Physical Therapy

## 2016-07-20 VITALS — BP 127/71 | HR 77 | Ht 62.0 in | Wt 99.4 lb

## 2016-07-20 DIAGNOSIS — G20 Parkinson's disease: Secondary | ICD-10-CM

## 2016-07-20 DIAGNOSIS — R269 Unspecified abnormalities of gait and mobility: Secondary | ICD-10-CM | POA: Diagnosis not present

## 2016-07-20 DIAGNOSIS — R258 Other abnormal involuntary movements: Secondary | ICD-10-CM | POA: Diagnosis not present

## 2016-07-20 DIAGNOSIS — R498 Other voice and resonance disorders: Secondary | ICD-10-CM

## 2016-07-20 NOTE — Patient Instructions (Signed)
-   will try carb/levo again, but lower dose (half tab daily x 1 week; then half tab twice a day)  - continue physical therapy exercises  - use rollator walker  - in future may consider medication for depression and anxiety

## 2016-07-20 NOTE — Progress Notes (Signed)
[facility]  PATIENT: Julie Farrell DOB: [birth year]  REFERRING CLINICIAN: Junius ArgyleS Koneswaran HISTORY FROM: patient and daughter  REASON FOR VISIT: follow up    HISTORICAL  CHIEF COMPLAINT:  Chief Complaint  Patient presents with  . Follow-up  . Gait Problem    go over test results/ had fall R hip pain, [facility] visit 07-06-16.  Sinemet stopped due to SE (decreased energy.    HISTORY OF PRESENT ILLNESS:   UPDATE 07/20/16: Since last visit, doing about the same. Tried carb/levo, but had dizziness, tired feeling and could not tolerate after 2-3 week trial. Had a fall and went to ER a few weeks ago (tripped on cat). Has gone to PT. Using cane and walker.   PRIOR HPI (05/19/16): 71 year old right-handed female here for evaluation of speech, language, gait difficulty. Patient has had gradual onset, progressive speech, gait, leg difficulty for past 1 year. Patient having soft hoarse voice, difficult to understand, some mild swallowing difficulty. Patient also having issues with small short steps, decreased mobility, decreased movement, decreased speed of activities. Sometimes she feels her toes draw up on the left side. Patient having decreased facial expressions. Patient having more problems with anxiety and "jitteriness". She is also had 40 pound weight loss over the past one year, unintentionally. Patient imaging generalized fatigue, swelling legs, constipation, not asleep. No family history of tremor, Parkinson's disease or other similar problems. No sudden onset of any symptoms.   REVIEW OF SYSTEMS: Full 14 system review of systems performed and negative with exception of: As per history of present illness.  ALLERGIES: Allergies  Allergen Reactions  . Vicodin [Hydrocodone-Acetaminophen] Other (See Comments)    Upset stomach    HOME MEDICATIONS: Outpatient Medications Prior to Visit  Medication Sig Dispense Refill  . amLODipine (NORVASC) 5 MG tablet Take 5 mg by mouth  daily.   1  . aspirin EC 81 MG tablet Take 81 mg by mouth daily.    Marland Kitchen. levothyroxine (SYNTHROID, LEVOTHROID) 50 MCG tablet Take 50 mcg by mouth daily.  0  . simvastatin (ZOCOR) 20 MG tablet Take 20 mg by mouth at bedtime.    . ALPRAZolam (XANAX) 0.5 MG tablet as needed.  0  . carbidopa-levodopa (SINEMET IR) 25-100 MG tablet Take 1 tablet by mouth 3 (three) times daily before meals. (Patient not taking: Reported on 07/20/2016) 90 tablet 6  . Menthol-Camphor (ICY HOT ADVANCED RELIEF) 16-11 % CREA Apply 1 application topically daily as needed (pain).    Marland Kitchen. oxybutynin (DITROPAN XL) 5 MG 24 hr tablet Take 1 tablet (5 mg total) by mouth at bedtime. 30 tablet 0   No facility-administered medications prior to visit.     PAST MEDICAL HISTORY: Past Medical History:  Diagnosis Date  . Abnormal pap   . Anxiety   . DDD (degenerative disc disease), lumbar   . Hemorrhoids   . HPV test positive 05/17/2013  . Hyperlipidemia   . Hypertension   . Hypothyroidism   . Thyroid disease     PAST SURGICAL HISTORY: Past Surgical History:  Procedure Laterality Date  . BACK SURGERY  10/2015   fusion L4-L 5, Dr Lovell SheehanJenkins  . CATARACT EXTRACTION W/PHACO Left 05/16/2012   Procedure: CATARACT EXTRACTION PHACO AND INTRAOCULAR LENS PLACEMENT (IOC);  Surgeon: Gemma PayorKerry Hunt, MD;  Location: [facility];  Service: Ophthalmology;  Laterality: Left;  CDE:15.20  . COLONOSCOPY  2009   [facility]-Dr. Karilyn Cotaehman  . EYE SURGERY Bilateral 14,15   cataracts  . Fatty Tissue Excision Left  2008   [facility]-Dr. Hilda Lias arm  . right ankle Right    fx    FAMILY HISTORY: Family History  Problem Relation Age of Onset  . Heart attack Mother   . Heart disease Mother        CAD  . Heart disease Brother   . Heart disease Brother   . Hypertension Father   . Stroke Father     SOCIAL HISTORY:  Social History   Social History  . Marital status: Divorced    Spouse name: N/A  . Number of children: 1  . Years of education: GED   Occupational History    .      NA   Social History Main Topics  . Smoking status: Never Smoker  . Smokeless tobacco: Never Used  . Alcohol use No  . Drug use: No  . Sexual activity: Not Currently    Birth control/ protection: Post-menopausal   Other Topics Concern  . Not on file   Social History Narrative   Lives with daughter   Caffeine -coffee occas     PHYSICAL EXAM  GENERAL EXAM/CONSTITUTIONAL: Vitals:  Vitals:   07/20/16 0923  BP: 127/71  Pulse: 77  Weight: 99 lb 6.4 oz (45.1 kg)  Height: 5\' 2"  (1.575 m)   Body mass index is 18.18 kg/m. No exam data present  Patient is in no distress; well developed, nourished and groomed; neck is supple  MASKED FACIES  CARDIOVASCULAR:  Examination of carotid arteries is normal; no carotid bruits  Regular rate and rhythm, no murmurs  Examination of peripheral vascular system by observation and palpation is normal  EYES:  Ophthalmoscopic exam of optic discs and posterior segments is normal; no papilledema or hemorrhages  MUSCULOSKELETAL:  Gait, strength, tone, movements noted in Neurologic exam below  NEUROLOGIC: MENTAL STATUS:  No flowsheet data found.  awake, alert, oriented to person, place and time  recent and remote memory intact  normal attention and concentration  language fluent, comprehension intact, naming intact,   fund of knowledge appropriate  CRANIAL NERVE:   2nd - no papilledema on fundoscopic exam  2nd, 3rd, 4th, 6th - pupils equal and reactive to light, visual fields full to confrontation, extraocular muscles intact, no nystagmus  5th - facial sensation symmetric  7th - facial strength symmetric  8th - hearing intact  9th - palate elevates symmetrically, uvula midline  11th - shoulder shrug symmetric  12th - tongue protrusion midline  HOARSE VOICE  SOFT VOLUME  MOTOR:   COGWHEELING RIGIDITY IN BUE  MODERATE-SEVERE BRADYKINESIA IN BUE (LUE SLOWER THAN RIGHT) AND BLE  FINE POSTURAL AND  RESTING TREMOR  full strength in the BUE, BLE  SENSORY:   normal and symmetric to light touch, temperature, vibration  COORDINATION:   finger-nose-finger, fine finger movements MILD DYSMETRIA  REFLEXES:   deep tendon reflexes BRISK IN BUE; BLE 2  GAIT/STATION:   STOOPED POSTURE; SHORT STEPS; SHUFFLING GAIT; EN BLOC TURNING; FREEZING WITH TURNS    DIAGNOSTIC DATA (LABS, IMAGING, TESTING) - I reviewed patient records, labs, notes, testing and imaging myself where available.  Lab Results  Component Value Date   WBC 5.0 06/20/2016   HGB 13.5 06/20/2016   HCT 39.9 06/20/2016   MCV 91.3 06/20/2016   PLT 270 06/20/2016      Component Value Date/Time   NA 140 06/20/2016 1147   K 3.9 06/20/2016 1147   CL 106 06/20/2016 1147   CO2 27 06/20/2016 1147   GLUCOSE 100 (  H) 06/20/2016 1147   BUN 15 06/20/2016 1147   CREATININE 0.73 06/20/2016 1147   CALCIUM 10.3 06/20/2016 1147   PROT 7.5 06/20/2016 1147   ALBUMIN 4.5 06/20/2016 1147   AST 19 06/20/2016 1147   ALT 17 06/20/2016 1147   ALKPHOS 66 06/20/2016 1147   BILITOT 0.9 06/20/2016 1147   GFRNONAA >60 06/20/2016 1147   GFRAA >60 06/20/2016 1147   No results found for: CHOL, HDL, LDLCALC, LDLDIRECT, TRIG, CHOLHDL No results found for: ZOXW9U No results found for: VITAMINB12 No results found for: TSH   10/31/15 xray lumbar [I reviewed images myself and agree with interpretation. -[facility]]  - Posterior lumbar interbody fusion at L4-L5.  06/11/16 MRI brain [I reviewed images myself and agree with interpretation. -[facility]P]  1.   T2/FLAIR hyperintense foci in the subcortical and deep white matter of the frontal lobes consistent with altered chronic microvascular ischemic changes.   2.   There are no acute findings.  07/06/16 xray lumbar spine and right hip - No acute abnormality. - Status post L4-5 fusion.     ASSESSMENT AND PLAN  71 y.o. year old female here with gradual onset progressive speech, swallowing, gait  difficulty. Exam notable for bradykinesia, cogwheel rigidity, short shuffling steps, postural instability, rare resting tremor. Signs and symptoms consistent with idiopathic Parkinson's disease.    Ddx: parkinsonism (idiopathic parkinson's disease) + low back pain + anxiety  1. Parkinson's disease (HCC)   2. Gait difficulty   3. Hypophonia   4. Bradykinesia      PLAN: I spent 40 minutes of face to face time with patient. Greater than 50% of time was spent in counseling and coordination of care with patient. In summary we discussed:   - will try empiric trial of carb/levo again, but lower dose (half tab daily x 1 week; then twice a day) - continue physical therapy exercises - use rollator walker - in future may consider SSRI for depression and anxiety  Return in about 3 months (around 10/20/2016).    Suanne Marker, MD 07/20/2016, 10:43 AM Certified in Neurology, Neurophysiology and Neuroimaging  [facility] [address] [place], [place] [address] [phone]

## 2016-07-27 ENCOUNTER — Encounter (HOSPITAL_COMMUNITY): Payer: Medicare HMO | Admitting: Physical Therapy

## 2016-08-03 ENCOUNTER — Other Ambulatory Visit (HOSPITAL_COMMUNITY)
Admission: RE | Admit: 2016-08-03 | Discharge: 2016-08-03 | Disposition: A | Discharge status: Home / Self Care | Payer: Medicare HMO | Source: Ambulatory Visit | Attending: Adult Health | Admitting: Adult Health

## 2016-08-03 ENCOUNTER — Ambulatory Visit (INDEPENDENT_AMBULATORY_CARE_PROVIDER_SITE_OTHER): Payer: Medicare HMO | Admitting: Adult Health

## 2016-08-03 ENCOUNTER — Encounter: Payer: Self-pay | Admitting: Adult Health

## 2016-08-03 VITALS — BP 100/60 | HR 72 | Ht 61.4 in | Wt 98.0 lb

## 2016-08-03 DIAGNOSIS — F329 Major depressive disorder, single episode, unspecified: Secondary | ICD-10-CM | POA: Diagnosis not present

## 2016-08-03 DIAGNOSIS — Z8619 Personal history of other infectious and parasitic diseases: Secondary | ICD-10-CM | POA: Insufficient documentation

## 2016-08-03 DIAGNOSIS — Z1212 Encounter for screening for malignant neoplasm of rectum: Secondary | ICD-10-CM

## 2016-08-03 DIAGNOSIS — K5900 Constipation, unspecified: Secondary | ICD-10-CM | POA: Diagnosis not present

## 2016-08-03 DIAGNOSIS — N898 Other specified noninflammatory disorders of vagina: Secondary | ICD-10-CM

## 2016-08-03 DIAGNOSIS — Z124 Encounter for screening for malignant neoplasm of cervix: Secondary | ICD-10-CM

## 2016-08-03 DIAGNOSIS — F32A Depression, unspecified: Secondary | ICD-10-CM | POA: Insufficient documentation

## 2016-08-03 DIAGNOSIS — Z1211 Encounter for screening for malignant neoplasm of colon: Secondary | ICD-10-CM | POA: Diagnosis not present

## 2016-08-03 LAB — HEMOCCULT GUIAC POC 1CARD (OFFICE): Fecal Occult Blood, POC: NEGATIVE

## 2016-08-03 MED ORDER — ESCITALOPRAM OXALATE 10 MG PO TABS: 10.0000 mg | ORAL_TABLET | Freq: Every day | ORAL | 6 refills | Status: AC

## 2016-08-03 MED ORDER — LUBIPROSTONE 24 MCG PO CAPS: 24.0000 ug | ORAL_CAPSULE | Freq: Every day | ORAL | 0 refills | Status: DC

## 2016-08-03 NOTE — Addendum Note (Signed)
Addended by: Federico FlakeNES, PEGGY A on: 08/03/2016 04:37 PM   Modules accepted: Orders

## 2016-08-03 NOTE — Progress Notes (Signed)
Subjective:     Patient ID: Julie Farrell, female   DOB: August 11, 1945, 71 y.o.   MRN: [id number]  HPI Julie Farrell is a 71 year old white female in for pap smear, she had normal pap with negative HPV, but year before was +HPV.She has been diagnosed with Parkinson's since last visit and had back surgery.She is working slowly with quad cane today, and she has lost weight. PCP is Dr Sherwood GamblerFusco.   Review of Systems  +vaginal dryness +constiaption +depression She is not sexually active   Reviewed past medical,surgical, social and family history. Reviewed medications and allergies.     Objective:   Physical Exam BP 100/60 (BP Location: Left Arm, Patient Position: Sitting, Cuff Size: Small)   Pulse 72   Ht 5' 1.4" (1.56 m)   Wt 98 lb (44.5 kg)   BMI 18.28 kg/m    Skin warm and dry.Pelvic: external genitalia is normal in appearance no lesions, vagina: dry, pale and atrophic,urethra has no lesions or masses noted, cervix:atrophic and poorly seen, pap with HPV performed, uterus: normal size, shape and contour, non tender, no masses felt, adnexa: no masses or tenderness noted. Bladder is non tender and no masses felt.On rectal exam, no masses felt and hemoccult negative. PHQ 9 score 6, she denies being suicidal but would like mild medication. And would like something for constipation.Given samples of amitiza to try and will start on lexapro. She says she walks daily and drinks water til about 6 o'clock,I told her not to dry for now but talk with neurologist, I don't feel to response time is good.   Assessment:     1. Pap smear for cervical cancer screening   2. History of HPV infection   3. Vaginal dryness   4. Constipation, unspecified constipation type   5. Depression, unspecified depression type   6. Screening for colorectal cancer         Plan:     Meds ordered this encounter  Medications  . lubiprostone (AMITIZA) 24 MCG capsule    Sig: Take 1 capsule (24 mcg total) by mouth daily with  breakfast.    Dispense:  28 capsule    Refill:  0    Order Specific Question:   Supervising Provider    Answer:   Despina HiddenEURE, LUTHER H [[id number]]  . escitalopram (LEXAPRO) 10 MG tablet    Sig: Take 1 tablet (10 mg total) by mouth daily.    Dispense:  30 tablet    Refill:  6    Order Specific Question:   Supervising Provider    Answer:   Duane LopeEURE, LUTHER H [[id number]]  Follow up in 4 weeks  Mammogram yearly Labs with PCP This can be last pap, if negative HPV

## 2016-08-05 ENCOUNTER — Emergency Department (HOSPITAL_COMMUNITY): Payer: Medicare HMO

## 2016-08-05 ENCOUNTER — Encounter (HOSPITAL_COMMUNITY): Payer: Self-pay | Admitting: Emergency Medicine

## 2016-08-05 ENCOUNTER — Emergency Department (HOSPITAL_COMMUNITY)
Admission: EM | Admit: 2016-08-05 | Discharge: 2016-08-05 | Disposition: A | Discharge status: Home / Self Care | Payer: Medicare HMO | Attending: Emergency Medicine | Admitting: Emergency Medicine

## 2016-08-05 DIAGNOSIS — G20 Parkinson's disease: Secondary | ICD-10-CM | POA: Diagnosis not present

## 2016-08-05 DIAGNOSIS — E039 Hypothyroidism, unspecified: Secondary | ICD-10-CM | POA: Diagnosis not present

## 2016-08-05 DIAGNOSIS — Z7982 Long term (current) use of aspirin: Secondary | ICD-10-CM | POA: Insufficient documentation

## 2016-08-05 DIAGNOSIS — R197 Diarrhea, unspecified: Secondary | ICD-10-CM | POA: Diagnosis not present

## 2016-08-05 DIAGNOSIS — R112 Nausea with vomiting, unspecified: Secondary | ICD-10-CM | POA: Diagnosis not present

## 2016-08-05 DIAGNOSIS — I10 Essential (primary) hypertension: Secondary | ICD-10-CM | POA: Diagnosis not present

## 2016-08-05 DIAGNOSIS — Z79899 Other long term (current) drug therapy: Secondary | ICD-10-CM | POA: Insufficient documentation

## 2016-08-05 DIAGNOSIS — R1110 Vomiting, unspecified: Secondary | ICD-10-CM | POA: Diagnosis not present

## 2016-08-05 HISTORY — DX: Parkinson's disease without dyskinesia, without mention of fluctuations: G20.A1

## 2016-08-05 HISTORY — DX: Parkinson's disease: G20

## 2016-08-05 LAB — DIFFERENTIAL
BASOS ABS: 0 10*3/uL (ref 0.0–0.1)
BASOS PCT: 0 %
EOS ABS: 0 10*3/uL (ref 0.0–0.7)
Eosinophils Relative: 0 %
Lymphocytes Relative: 16 %
Lymphs Abs: 0.7 10*3/uL (ref 0.7–4.0)
MONO ABS: 0.2 10*3/uL (ref 0.1–1.0)
Monocytes Relative: 5 %
NEUTROS ABS: 3.7 10*3/uL (ref 1.7–7.7)
Neutrophils Relative %: 79 %

## 2016-08-05 LAB — URINALYSIS, ROUTINE W REFLEX MICROSCOPIC
BILIRUBIN URINE: NEGATIVE
Bacteria, UA: NONE SEEN
Glucose, UA: NEGATIVE mg/dL
KETONES UR: 5 mg/dL — AB
LEUKOCYTES UA: NEGATIVE
Nitrite: NEGATIVE
PROTEIN: NEGATIVE mg/dL
Specific Gravity, Urine: 1.005 (ref 1.005–1.030)
pH: 6 (ref 5.0–8.0)

## 2016-08-05 LAB — COMPREHENSIVE METABOLIC PANEL
ALT: 14 U/L (ref 14–54)
ANION GAP: 10 (ref 5–15)
AST: 20 U/L (ref 15–41)
Albumin: 4.6 g/dL (ref 3.5–5.0)
Alkaline Phosphatase: 57 U/L (ref 38–126)
BUN: 14 mg/dL (ref 6–20)
CHLORIDE: 104 mmol/L (ref 101–111)
CO2: 24 mmol/L (ref 22–32)
Calcium: 10.2 mg/dL (ref 8.9–10.3)
Creatinine, Ser: 0.8 mg/dL (ref 0.44–1.00)
Glucose, Bld: 105 mg/dL — ABNORMAL HIGH (ref 65–99)
POTASSIUM: 3.3 mmol/L — AB (ref 3.5–5.1)
SODIUM: 138 mmol/L (ref 135–145)
Total Bilirubin: 0.8 mg/dL (ref 0.3–1.2)
Total Protein: 7.2 g/dL (ref 6.5–8.1)

## 2016-08-05 LAB — CYTOLOGY - PAP
DIAGNOSIS: NEGATIVE
HPV (WINDOPATH): NOT DETECTED

## 2016-08-05 LAB — CBC
HEMATOCRIT: 39.9 % (ref 36.0–46.0)
HEMOGLOBIN: 13.8 g/dL (ref 12.0–15.0)
MCH: 31 pg (ref 26.0–34.0)
MCHC: 34.6 g/dL (ref 30.0–36.0)
MCV: 89.7 fL (ref 78.0–100.0)
Platelets: 270 10*3/uL (ref 150–400)
RBC: 4.45 MIL/uL (ref 3.87–5.11)
RDW: 12.4 % (ref 11.5–15.5)
WBC: 4.9 10*3/uL (ref 4.0–10.5)

## 2016-08-05 LAB — LIPASE, BLOOD: LIPASE: 35 U/L (ref 11–51)

## 2016-08-05 MED ORDER — IOPAMIDOL (ISOVUE-300) INJECTION 61%
75.0000 mL | Freq: Once | INTRAVENOUS | Status: AC | PRN
  Administered 2016-08-05: 75 mL via INTRAVENOUS

## 2016-08-05 MED ORDER — ONDANSETRON 4 MG PO TBDP: 4.0000 mg | ORAL_TABLET | Freq: Three times a day (TID) | ORAL | 0 refills | Status: DC | PRN

## 2016-08-05 MED ORDER — IOPAMIDOL (ISOVUE-300) INJECTION 61%
INTRAVENOUS | Status: AC
  Filled 2016-08-05: qty 30

## 2016-08-05 MED ORDER — POTASSIUM CHLORIDE CRYS ER 20 MEQ PO TBCR
40.0000 meq | EXTENDED_RELEASE_TABLET | Freq: Once | ORAL | Status: AC
  Administered 2016-08-05: 40 meq via ORAL
  Filled 2016-08-05: qty 2

## 2016-08-05 MED ORDER — SODIUM CHLORIDE 0.9 % IV SOLN
INTRAVENOUS | Status: DC
  Administered 2016-08-05: 08:00:00 via INTRAVENOUS

## 2016-08-05 MED ORDER — SODIUM CHLORIDE 0.9 % IV BOLUS (SEPSIS)
500.0000 mL | Freq: Once | INTRAVENOUS | Status: AC
  Administered 2016-08-05: 500 mL via INTRAVENOUS

## 2016-08-05 MED ORDER — ONDANSETRON HCL 4 MG/2ML IJ SOLN
4.0000 mg | INTRAMUSCULAR | Status: AC | PRN
  Administered 2016-08-05 (×2): 4 mg via INTRAVENOUS
  Filled 2016-08-05 (×2): qty 2

## 2016-08-05 MED ORDER — DICYCLOMINE HCL 10 MG/ML IM SOLN
10.0000 mg | Freq: Once | INTRAMUSCULAR | Status: AC
  Administered 2016-08-05: 10 mg via INTRAMUSCULAR
  Filled 2016-08-05: qty 2

## 2016-08-05 NOTE — ED Notes (Signed)
Patient transported to CT 

## 2016-08-05 NOTE — ED Notes (Signed)
Patient tolerating fluid. 

## 2016-08-05 NOTE — Discharge Instructions (Signed)
Take the prescription as directed. Stop taking the new medication Amitiza. Increase your fluid intake (ie:  Gatoraide) for the next few days.  Eat a bland diet and advance to your regular diet slowly as you can tolerate it.   Avoid full strength juices, as well as milk and milk products until your diarrhea has resolved.   Call your regular medical doctor today to schedule a follow up appointment this week.  Return to the Emergency Department immediately if not improving (or even worsening) despite taking the medicines as prescribed, any black or bloody stool or vomit, if you develop a fever over "101," or for any other concerns.

## 2016-08-05 NOTE — ED Provider Notes (Signed)
[facility] Provider Note   CSN: [id number] Arrival date & time: 08/05/16  1914     History   Chief Complaint Chief Complaint  Patient presents with  . Nausea  . Emesis    HPI Julie Farrell is a 71 y.o. female.  HPI  Pt was seen at 0815. Per pt, c/o gradual onset and persistence of multiple intermittent episodes of N/V/D that began yesterday.   Describes the stools as "watery."  Pt states she "was constipated" and her PMD rx her amitiza 2 days ago. Pt states she took one dose of this medication before her symptoms began.  Denies abd pain, no CP/SOB, no back pain, no fevers, no black or blood in stools or emesis.    Past Medical History:  Diagnosis Date  . Abnormal pap   . Anxiety   . DDD (degenerative disc disease), lumbar   . Hemorrhoids   . HPV test positive 05/17/2013  . Hyperlipidemia   . Hypertension   . Hypothyroidism   . Parkinson's disease (HCC)   . Thyroid disease   . Vaginal Pap smear, abnormal     Patient Active Problem List   Diagnosis Date Noted  . Pap smear for cervical cancer screening 08/03/2016  . Constipation 08/03/2016  . Vaginal dryness 08/03/2016  . History of HPV infection 08/03/2016  . Depression 08/03/2016  . Spondylolisthesis of lumbar region 10/31/2015  . HPV test positive 05/17/2013  . Cataract 05/11/2012    Past Surgical History:  Procedure Laterality Date  . BACK SURGERY  10/2015   fusion L4-L 5, Dr Lovell Sheehan  . CATARACT EXTRACTION W/PHACO Left 05/16/2012   Procedure: CATARACT EXTRACTION PHACO AND INTRAOCULAR LENS PLACEMENT (IOC);  Surgeon: Gemma Payor, MD;  Location: [facility];  Service: Ophthalmology;  Laterality: Left;  CDE:15.20  . COLONOSCOPY  2009   [facility]-Dr. Karilyn Cota  . EYE SURGERY Bilateral 14,15   cataracts  . Fatty Tissue Excision Left 2008   [facility]-Dr. Hilda Lias arm  . right ankle Right    fx    OB History    Gravida Para Term Preterm AB Living   1 1       1    SAB TAB Ectopic Multiple Live Births                    Home Medications    Prior to Admission medications   Medication Sig Start Date End Date Taking? Authorizing Provider  ALPRAZolam Prudy Feeler) 0.5 MG tablet as needed. 03/17/16   [provider]  amLODipine (NORVASC) 5 MG tablet Take 5 mg by mouth daily.  01/26/15   [provider]  aspirin EC 81 MG tablet Take 81 mg by mouth daily.    [provider]  carbidopa-levodopa (SINEMET IR) 25-100 MG tablet Take 1 tablet by mouth 3 (three) times daily before meals. Patient not taking: Reported on 07/20/2016 05/19/16   Penumalli, Glenford Bayley, MD  escitalopram (LEXAPRO) 10 MG tablet Take 1 tablet (10 mg total) by mouth daily. 08/03/16   Adline Potter, NP  levothyroxine (SYNTHROID, LEVOTHROID) 50 MCG tablet Take 50 mcg by mouth daily. 06/06/14   [provider]  lubiprostone (AMITIZA) 24 MCG capsule Take 1 capsule (24 mcg total) by mouth daily with breakfast. 08/03/16   Adline Potter, NP  simvastatin (ZOCOR) 20 MG tablet Take 20 mg by mouth at bedtime.    [provider]    Family History Family History  Problem Relation Age of Onset  .  Heart attack Mother   . Heart disease Mother        CAD  . Heart disease Brother   . Heart disease Brother   . Hypertension Father   . Stroke Father     Social History Social History  Substance Use Topics  . Smoking status: Never Smoker  . Smokeless tobacco: Never Used  . Alcohol use No     Allergies   Vicodin [hydrocodone-acetaminophen]   Review of Systems Review of Systems ROS: Statement: All systems negative except as marked or noted in the HPI; Constitutional: Negative for fever and chills. ; ; Eyes: Negative for eye pain, redness and discharge. ; ; ENMT: Negative for ear pain, hoarseness, nasal congestion, sinus pressure and sore throat. ; ; Cardiovascular: Negative for chest pain, palpitations, diaphoresis, dyspnea and peripheral edema. ; ; Respiratory: Negative for cough, wheezing and stridor. ; ;  Gastrointestinal: +N/V/D. Negative for abdominal pain, blood in stool, hematemesis, jaundice and rectal bleeding. . ; ; Genitourinary: Negative for dysuria, flank pain and hematuria. ; ; Musculoskeletal: Negative for back pain and neck pain. Negative for swelling and trauma.; ; Skin: Negative for pruritus, rash, abrasions, blisters, bruising and skin lesion.; ; Neuro: Negative for headache, lightheadedness and neck stiffness. Negative for weakness, altered level of consciousness, altered mental status, extremity weakness, paresthesias, involuntary movement, seizure and syncope.       Physical Exam Updated Vital Signs BP 140/63 (BP Location: Right Arm)   Pulse 74   Temp 98.2 F (36.8 C) (Oral)   Resp 18   Ht 5\' 2"  (1.575 m)   Wt 44.9 kg (99 lb)   SpO2 98%   BMI 18.11 kg/m   Physical Exam 0820: Physical examination:  Nursing notes reviewed; Vital signs and O2 SAT reviewed;  Constitutional:  Thin, In no acute distress; Head:  Normocephalic, atraumatic; Eyes: EOMI, PERRL, No scleral icterus; ENMT: Mouth and pharynx normal, Mucous membranes dry; Neck: Supple, Full range of motion, No lymphadenopathy; Cardiovascular: Regular rate and rhythm, No gallop; Respiratory: Breath sounds clear & equal bilaterally, No wheezes.  Speaking full sentences with ease, Normal respiratory effort/excursion; Chest: Nontender, Movement normal; Abdomen: Soft, Nontender, Nondistended, Normal bowel sounds; Genitourinary: No CVA tenderness; Extremities: Pulses normal, No tenderness, No edema, No calf edema or asymmetry.; Neuro: AA&Ox3, Major CN grossly intact.  Speech clear. No gross focal motor deficits in extremities.; Skin: Color normal, Warm, Dry.    ED Treatments / Results  Labs (all labs ordered are listed, but only abnormal results are displayed)   EKG  EKG Interpretation None       Radiology   Procedures Procedures (including critical care time)  Medications Ordered in ED Medications  0.9 %   sodium chloride infusion ( Intravenous New Bag/Given 08/05/16 0829)  ondansetron (ZOFRAN) injection 4 mg (4 mg Intravenous Given 08/05/16 0827)  dicyclomine (BENTYL) injection 10 mg (not administered)  sodium chloride 0.9 % bolus 500 mL (500 mLs Intravenous New Bag/Given 08/05/16 0828)     Initial Impression / Assessment and Plan / ED Course  I have reviewed the triage vital signs and the nursing notes.  Pertinent labs & imaging results that were available during my care of the patient were reviewed by me and considered in my medical decision making (see chart for details).  MDM Reviewed: previous chart, nursing note and vitals Reviewed previous: labs Interpretation: labs, x-ray and CT scan   Results for orders placed or performed during the hospital encounter of 08/05/16  Lipase, blood  Result Value Ref Range   Lipase 35 11 - 51 U/L  Comprehensive metabolic panel  Result Value Ref Range   Sodium 138 135 - 145 mmol/L   Potassium 3.3 (L) 3.5 - 5.1 mmol/L   Chloride 104 101 - 111 mmol/L   CO2 24 22 - 32 mmol/L   Glucose, Bld 105 (H) 65 - 99 mg/dL   BUN 14 6 - 20 mg/dL   Creatinine, Ser 6.570.80 0.44 - 1.00 mg/dL   Calcium 84.610.2 8.9 - 96.210.3 mg/dL   Total Protein 7.2 6.5 - 8.1 g/dL   Albumin 4.6 3.5 - 5.0 g/dL   AST 20 15 - 41 U/L   ALT 14 14 - 54 U/L   Alkaline Phosphatase 57 38 - 126 U/L   Total Bilirubin 0.8 0.3 - 1.2 mg/dL   GFR calc non Af Amer >60 >60 mL/min   GFR calc Af Amer >60 >60 mL/min   Anion gap 10 5 - 15  CBC  Result Value Ref Range   WBC 4.9 4.0 - 10.5 K/uL   RBC 4.45 3.87 - 5.11 MIL/uL   Hemoglobin 13.8 12.0 - 15.0 g/dL   HCT 95.239.9 84.136.0 - 32.446.0 %   MCV 89.7 78.0 - 100.0 fL   MCH 31.0 26.0 - 34.0 pg   MCHC 34.6 30.0 - 36.0 g/dL   RDW 40.112.4 02.711.5 - 25.315.5 %   Platelets 270 150 - 400 K/uL  Urinalysis, Routine w reflex microscopic  Result Value Ref Range   Color, Urine STRAW (A) YELLOW   APPearance CLEAR CLEAR   Specific Gravity, Urine 1.005 1.005 - 1.030   pH 6.0  5.0 - 8.0   Glucose, UA NEGATIVE NEGATIVE mg/dL   Hgb urine dipstick MODERATE (A) NEGATIVE   Bilirubin Urine NEGATIVE NEGATIVE   Ketones, ur 5 (A) NEGATIVE mg/dL   Protein, ur NEGATIVE NEGATIVE mg/dL   Nitrite NEGATIVE NEGATIVE   Leukocytes, UA NEGATIVE NEGATIVE   RBC / HPF 0-5 0 - 5 RBC/hpf   WBC, UA 0-5 0 - 5 WBC/hpf   Bacteria, UA NONE SEEN NONE SEEN   Squamous Epithelial / LPF 0-5 (A) NONE SEEN   Mucous PRESENT   Differential  Result Value Ref Range   Neutrophils Relative % 79 %   Neutro Abs 3.7 1.7 - 7.7 K/uL   Lymphocytes Relative 16 %   Lymphs Abs 0.7 0.7 - 4.0 K/uL   Monocytes Relative 5 %   Monocytes Absolute 0.2 0.1 - 1.0 K/uL   Eosinophils Relative 0 %   Eosinophils Absolute 0.0 0.0 - 0.7 K/uL   Basophils Relative 0 %   Basophils Absolute 0.0 0.0 - 0.1 K/uL   Dg Chest 2 View Result Date: 08/05/2016 CLINICAL DATA:  Nausea, vomiting, and diarrhea EXAM: CHEST  2 VIEW COMPARISON:  09/02/2015 FINDINGS: Stable borderline heart size. Stable mediastinal contours with mild prominence of the ascending aorta. There is no edema, consolidation, effusion, or pneumothorax. Nipple shadow noted at the right base. No acute osseous finding. IMPRESSION: No evidence of active disease. Electronically Signed   By: Marnee SpringJonathon  Watts M.D.   On: 08/05/2016 08:42   Ct Abdomen Pelvis W Contrast Result Date: 08/05/2016 CLINICAL DATA:  Nausea, vomiting started yesterday. EXAM: CT ABDOMEN AND PELVIS WITH CONTRAST TECHNIQUE: Multidetector CT imaging of the abdomen and pelvis was performed using the standard protocol following bolus administration of intravenous contrast. CONTRAST:  75mL ISOVUE-300 IOPAMIDOL (ISOVUE-300) INJECTION 61% COMPARISON:  06/20/2016 FINDINGS: Lower chest: Mild left basilar scarring. Hepatobiliary:  No focal liver abnormality is seen. No gallstones, gallbladder wall thickening, or biliary dilatation. Pancreas: Unremarkable. No pancreatic ductal dilatation or surrounding inflammatory  changes. Spleen: Normal in size without focal abnormality. Adrenals/Urinary Tract: Adrenal glands are unremarkable. Kidneys are normal, without renal calculi, focal lesion, or hydronephrosis. Bilateral extrarenal pelvis. Bladder is unremarkable. Stomach/Bowel: Stomach is within normal limits. Appendix appears normal. No evidence of bowel wall thickening, distention, or inflammatory changes. Vascular/Lymphatic: No significant vascular findings are present. Abdominal aortic atherosclerosis. No enlarged abdominal or pelvic lymph nodes. Reproductive: Uterus and bilateral adnexa are unremarkable. Other: No abdominal wall hernia or abnormality. No abdominopelvic ascites. Musculoskeletal: No acute osseous abnormality. No lytic or sclerotic osseous lesion. Posterior lumbar interbody fusion at L4-5. IMPRESSION: 1. No acute abdominal or pelvic pathology. 2.  Aortic Atherosclerosis (ICD10-170.0) Electronically Signed   By: Elige Ko   On: 08/05/2016 11:01    1225:  Potassium repleted PO. Pt has tol PO well while in the ED without N/V.  No stooling while in the ED.  Abd benign, VSS. Feels better and wants to go home now. Dx and testing d/w pt and family.  Questions answered.  Verb understanding, agreeable to d/c home with outpt f/u.     Final Clinical Impressions(s) / ED Diagnoses   Final diagnoses:  None    New Prescriptions New Prescriptions   No medications on file     Samuel Jester, DO 08/10/16 1522

## 2016-08-05 NOTE — ED Notes (Signed)
Patient placed on bedside commode.

## 2016-08-05 NOTE — ED Triage Notes (Signed)
Patient states nausea and vomiting starting yesterday. States has not been able to hold fluid down to keep hydrated.

## 2016-08-14 DIAGNOSIS — Z681 Body mass index (BMI) 19 or less, adult: Secondary | ICD-10-CM | POA: Diagnosis not present

## 2016-08-14 DIAGNOSIS — M4316 Spondylolisthesis, lumbar region: Secondary | ICD-10-CM | POA: Diagnosis not present

## 2016-08-14 DIAGNOSIS — G20 Parkinson's disease: Secondary | ICD-10-CM | POA: Diagnosis not present

## 2016-08-25 ENCOUNTER — Telehealth: Payer: Self-pay | Admitting: *Deleted

## 2016-08-26 NOTE — Telephone Encounter (Signed)
Pt said amitiza gave her diarrhea and took 1 lexapro and stopped, told her not to take them

## 2016-08-31 ENCOUNTER — Ambulatory Visit: Payer: Medicare HMO | Admitting: Adult Health

## 2016-09-22 DIAGNOSIS — Z681 Body mass index (BMI) 19 or less, adult: Secondary | ICD-10-CM | POA: Diagnosis not present

## 2016-09-22 DIAGNOSIS — Z1389 Encounter for screening for other disorder: Secondary | ICD-10-CM | POA: Diagnosis not present

## 2016-09-22 DIAGNOSIS — N390 Urinary tract infection, site not specified: Secondary | ICD-10-CM | POA: Diagnosis not present

## 2016-09-22 DIAGNOSIS — R1033 Periumbilical pain: Secondary | ICD-10-CM | POA: Diagnosis not present

## 2016-09-22 DIAGNOSIS — R350 Frequency of micturition: Secondary | ICD-10-CM | POA: Diagnosis not present

## 2016-09-22 DIAGNOSIS — N342 Other urethritis: Secondary | ICD-10-CM | POA: Diagnosis not present

## 2016-09-28 DIAGNOSIS — R350 Frequency of micturition: Secondary | ICD-10-CM | POA: Diagnosis not present

## 2016-10-02 DIAGNOSIS — N3281 Overactive bladder: Secondary | ICD-10-CM | POA: Diagnosis not present

## 2016-10-02 DIAGNOSIS — Z681 Body mass index (BMI) 19 or less, adult: Secondary | ICD-10-CM | POA: Diagnosis not present

## 2016-10-02 DIAGNOSIS — N390 Urinary tract infection, site not specified: Secondary | ICD-10-CM | POA: Diagnosis not present

## 2016-10-02 DIAGNOSIS — R311 Benign essential microscopic hematuria: Secondary | ICD-10-CM | POA: Diagnosis not present

## 2016-10-02 DIAGNOSIS — Z23 Encounter for immunization: Secondary | ICD-10-CM | POA: Diagnosis not present

## 2016-10-02 DIAGNOSIS — R471 Dysarthria and anarthria: Secondary | ICD-10-CM | POA: Diagnosis not present

## 2016-10-09 DIAGNOSIS — M545 Low back pain: Secondary | ICD-10-CM | POA: Diagnosis not present

## 2016-10-09 DIAGNOSIS — Z681 Body mass index (BMI) 19 or less, adult: Secondary | ICD-10-CM | POA: Diagnosis not present

## 2016-10-09 DIAGNOSIS — K5909 Other constipation: Secondary | ICD-10-CM | POA: Diagnosis not present

## 2016-10-09 DIAGNOSIS — E063 Autoimmune thyroiditis: Secondary | ICD-10-CM | POA: Diagnosis not present

## 2016-10-14 ENCOUNTER — Emergency Department (HOSPITAL_COMMUNITY)
Admission: EM | Admit: 2016-10-14 | Discharge: 2016-10-14 | Disposition: A | Discharge status: Home / Self Care | Payer: Medicare HMO

## 2016-10-15 ENCOUNTER — Emergency Department (HOSPITAL_COMMUNITY): Payer: Medicare HMO

## 2016-10-15 ENCOUNTER — Other Ambulatory Visit: Payer: Self-pay

## 2016-10-15 ENCOUNTER — Inpatient Hospital Stay (HOSPITAL_COMMUNITY)
Admission: EM | Admit: 2016-10-15 | Discharge: 2016-10-19 | DRG: 470 | Disposition: A | Discharge status: Skilled Nursing Facility | Payer: Medicare HMO | Attending: Family Medicine | Admitting: Family Medicine

## 2016-10-15 ENCOUNTER — Encounter (HOSPITAL_COMMUNITY): Payer: Self-pay | Admitting: *Deleted

## 2016-10-15 DIAGNOSIS — Z8249 Family history of ischemic heart disease and other diseases of the circulatory system: Secondary | ICD-10-CM | POA: Diagnosis not present

## 2016-10-15 DIAGNOSIS — M25522 Pain in left elbow: Secondary | ICD-10-CM | POA: Diagnosis present

## 2016-10-15 DIAGNOSIS — F419 Anxiety disorder, unspecified: Secondary | ICD-10-CM | POA: Diagnosis present

## 2016-10-15 DIAGNOSIS — F329 Major depressive disorder, single episode, unspecified: Secondary | ICD-10-CM | POA: Diagnosis not present

## 2016-10-15 DIAGNOSIS — W19XXXS Unspecified fall, sequela: Secondary | ICD-10-CM

## 2016-10-15 DIAGNOSIS — E785 Hyperlipidemia, unspecified: Secondary | ICD-10-CM | POA: Diagnosis not present

## 2016-10-15 DIAGNOSIS — I454 Nonspecific intraventricular block: Secondary | ICD-10-CM | POA: Diagnosis not present

## 2016-10-15 DIAGNOSIS — Z79899 Other long term (current) drug therapy: Secondary | ICD-10-CM

## 2016-10-15 DIAGNOSIS — S59902A Unspecified injury of left elbow, initial encounter: Secondary | ICD-10-CM | POA: Diagnosis not present

## 2016-10-15 DIAGNOSIS — F418 Other specified anxiety disorders: Secondary | ICD-10-CM | POA: Diagnosis present

## 2016-10-15 DIAGNOSIS — S22000A Wedge compression fracture of unspecified thoracic vertebra, initial encounter for closed fracture: Secondary | ICD-10-CM | POA: Diagnosis present

## 2016-10-15 DIAGNOSIS — Z885 Allergy status to narcotic agent status: Secondary | ICD-10-CM

## 2016-10-15 DIAGNOSIS — E079 Disorder of thyroid, unspecified: Secondary | ICD-10-CM | POA: Diagnosis present

## 2016-10-15 DIAGNOSIS — S72002D Fracture of unspecified part of neck of left femur, subsequent encounter for closed fracture with routine healing: Secondary | ICD-10-CM | POA: Diagnosis not present

## 2016-10-15 DIAGNOSIS — Z981 Arthrodesis status: Secondary | ICD-10-CM

## 2016-10-15 DIAGNOSIS — Z224 Carrier of infections with a predominantly sexual mode of transmission: Secondary | ICD-10-CM | POA: Diagnosis not present

## 2016-10-15 DIAGNOSIS — W1830XA Fall on same level, unspecified, initial encounter: Secondary | ICD-10-CM | POA: Diagnosis present

## 2016-10-15 DIAGNOSIS — Z7982 Long term (current) use of aspirin: Secondary | ICD-10-CM | POA: Diagnosis not present

## 2016-10-15 DIAGNOSIS — G20 Parkinson's disease: Secondary | ICD-10-CM | POA: Diagnosis not present

## 2016-10-15 DIAGNOSIS — R2689 Other abnormalities of gait and mobility: Secondary | ICD-10-CM | POA: Diagnosis not present

## 2016-10-15 DIAGNOSIS — M4854XS Collapsed vertebra, not elsewhere classified, thoracic region, sequela of fracture: Secondary | ICD-10-CM | POA: Diagnosis not present

## 2016-10-15 DIAGNOSIS — S7292XD Unspecified fracture of left femur, subsequent encounter for closed fracture with routine healing: Secondary | ICD-10-CM | POA: Diagnosis not present

## 2016-10-15 DIAGNOSIS — M4854XA Collapsed vertebra, not elsewhere classified, thoracic region, initial encounter for fracture: Secondary | ICD-10-CM | POA: Diagnosis not present

## 2016-10-15 DIAGNOSIS — E039 Hypothyroidism, unspecified: Secondary | ICD-10-CM | POA: Diagnosis not present

## 2016-10-15 DIAGNOSIS — R338 Other retention of urine: Secondary | ICD-10-CM | POA: Diagnosis not present

## 2016-10-15 DIAGNOSIS — F32A Depression, unspecified: Secondary | ICD-10-CM | POA: Diagnosis present

## 2016-10-15 DIAGNOSIS — R278 Other lack of coordination: Secondary | ICD-10-CM | POA: Diagnosis not present

## 2016-10-15 DIAGNOSIS — Z961 Presence of intraocular lens: Secondary | ICD-10-CM | POA: Diagnosis present

## 2016-10-15 DIAGNOSIS — Y92009 Unspecified place in unspecified non-institutional (private) residence as the place of occurrence of the external cause: Secondary | ICD-10-CM

## 2016-10-15 DIAGNOSIS — S72092A Other fracture of head and neck of left femur, initial encounter for closed fracture: Secondary | ICD-10-CM | POA: Diagnosis not present

## 2016-10-15 DIAGNOSIS — S7292XA Unspecified fracture of left femur, initial encounter for closed fracture: Secondary | ICD-10-CM | POA: Diagnosis not present

## 2016-10-15 DIAGNOSIS — R498 Other voice and resonance disorders: Secondary | ICD-10-CM | POA: Diagnosis not present

## 2016-10-15 DIAGNOSIS — S7292XS Unspecified fracture of left femur, sequela: Secondary | ICD-10-CM | POA: Diagnosis not present

## 2016-10-15 DIAGNOSIS — I10 Essential (primary) hypertension: Secondary | ICD-10-CM

## 2016-10-15 DIAGNOSIS — M5136 Other intervertebral disc degeneration, lumbar region: Secondary | ICD-10-CM | POA: Diagnosis not present

## 2016-10-15 DIAGNOSIS — S72009A Fracture of unspecified part of neck of unspecified femur, initial encounter for closed fracture: Secondary | ICD-10-CM

## 2016-10-15 DIAGNOSIS — Z96649 Presence of unspecified artificial hip joint: Secondary | ICD-10-CM | POA: Diagnosis not present

## 2016-10-15 DIAGNOSIS — S22009A Unspecified fracture of unspecified thoracic vertebra, initial encounter for closed fracture: Secondary | ICD-10-CM | POA: Diagnosis not present

## 2016-10-15 DIAGNOSIS — S22070A Wedge compression fracture of T9-T10 vertebra, initial encounter for closed fracture: Secondary | ICD-10-CM | POA: Diagnosis not present

## 2016-10-15 DIAGNOSIS — S72002A Fracture of unspecified part of neck of left femur, initial encounter for closed fracture: Principal | ICD-10-CM

## 2016-10-15 DIAGNOSIS — Z01818 Encounter for other preprocedural examination: Secondary | ICD-10-CM | POA: Diagnosis not present

## 2016-10-15 DIAGNOSIS — M6281 Muscle weakness (generalized): Secondary | ICD-10-CM | POA: Diagnosis not present

## 2016-10-15 LAB — BASIC METABOLIC PANEL
ANION GAP: 8 (ref 5–15)
BUN: 19 mg/dL (ref 6–20)
CALCIUM: 10.1 mg/dL (ref 8.9–10.3)
CO2: 25 mmol/L (ref 22–32)
CREATININE: 0.78 mg/dL (ref 0.44–1.00)
Chloride: 104 mmol/L (ref 101–111)
Glucose, Bld: 108 mg/dL — ABNORMAL HIGH (ref 65–99)
Potassium: 3.9 mmol/L (ref 3.5–5.1)
SODIUM: 137 mmol/L (ref 135–145)

## 2016-10-15 LAB — CBC WITH DIFFERENTIAL/PLATELET
BASOS ABS: 0 10*3/uL (ref 0.0–0.1)
BASOS PCT: 0 %
EOS ABS: 0 10*3/uL (ref 0.0–0.7)
Eosinophils Relative: 1 %
HEMATOCRIT: 39.1 % (ref 36.0–46.0)
HEMOGLOBIN: 13.2 g/dL (ref 12.0–15.0)
Lymphocytes Relative: 7 %
Lymphs Abs: 0.6 10*3/uL — ABNORMAL LOW (ref 0.7–4.0)
MCH: 30.9 pg (ref 26.0–34.0)
MCHC: 33.8 g/dL (ref 30.0–36.0)
MCV: 91.6 fL (ref 78.0–100.0)
Monocytes Absolute: 0.5 10*3/uL (ref 0.1–1.0)
Monocytes Relative: 6 %
NEUTROS ABS: 7.2 10*3/uL (ref 1.7–7.7)
NEUTROS PCT: 86 %
Platelets: 236 10*3/uL (ref 150–400)
RBC: 4.27 MIL/uL (ref 3.87–5.11)
RDW: 12.7 % (ref 11.5–15.5)
WBC: 8.3 10*3/uL (ref 4.0–10.5)

## 2016-10-15 LAB — PROTIME-INR
INR: 1.01
PROTHROMBIN TIME: 13.2 s (ref 11.4–15.2)

## 2016-10-15 MED ORDER — METHOCARBAMOL 500 MG PO TABS
500.0000 mg | ORAL_TABLET | Freq: Four times a day (QID) | ORAL | Status: DC | PRN
  Administered 2016-10-15 – 2016-10-17 (×3): 500 mg via ORAL
  Filled 2016-10-15 (×3): qty 1

## 2016-10-15 MED ORDER — DOCUSATE SODIUM 100 MG PO CAPS
100.0000 mg | ORAL_CAPSULE | Freq: Two times a day (BID) | ORAL | Status: DC
  Administered 2016-10-15 – 2016-10-19 (×8): 100 mg via ORAL
  Filled 2016-10-15 (×7): qty 1

## 2016-10-15 MED ORDER — OXYCODONE HCL 5 MG PO TABS
5.0000 mg | ORAL_TABLET | ORAL | Status: DC | PRN
  Administered 2016-10-16: 5 mg via ORAL
  Administered 2016-10-16 – 2016-10-18 (×3): 10 mg via ORAL
  Administered 2016-10-19: 5 mg via ORAL
  Filled 2016-10-15: qty 1
  Filled 2016-10-15: qty 2
  Filled 2016-10-15: qty 1
  Filled 2016-10-15 (×2): qty 2

## 2016-10-15 MED ORDER — AMLODIPINE BESYLATE 5 MG PO TABS
5.0000 mg | ORAL_TABLET | Freq: Every day | ORAL | Status: DC
Start: 2016-10-16 — End: 2016-10-19
  Administered 2016-10-16 – 2016-10-19 (×4): 5 mg via ORAL
  Filled 2016-10-15 (×4): qty 1

## 2016-10-15 MED ORDER — FENTANYL CITRATE (PF) 100 MCG/2ML IJ SOLN
100.0000 ug | Freq: Once | INTRAMUSCULAR | Status: AC
  Administered 2016-10-15: 100 ug via INTRAVENOUS
  Filled 2016-10-15: qty 2

## 2016-10-15 MED ORDER — BISACODYL 10 MG RE SUPP
10.0000 mg | Freq: Every day | RECTAL | Status: DC | PRN
  Administered 2016-10-18 – 2016-10-19 (×2): 10 mg via RECTAL
  Filled 2016-10-15 (×2): qty 1

## 2016-10-15 MED ORDER — LACTATED RINGERS IV SOLN
INTRAVENOUS | Status: AC
  Administered 2016-10-15: 21:00:00 via INTRAVENOUS

## 2016-10-15 MED ORDER — BACITRACIN ZINC 500 UNIT/GM EX OINT
TOPICAL_OINTMENT | Freq: Once | CUTANEOUS | Status: AC
  Administered 2016-10-15: 1 via TOPICAL
  Filled 2016-10-15: qty 0.9

## 2016-10-15 MED ORDER — SIMVASTATIN 20 MG PO TABS
20.0000 mg | ORAL_TABLET | Freq: Every day | ORAL | Status: DC
  Administered 2016-10-15 – 2016-10-18 (×4): 20 mg via ORAL
  Filled 2016-10-15 (×4): qty 1

## 2016-10-15 MED ORDER — VITAMIN B-12 100 MCG PO TABS
100.0000 ug | ORAL_TABLET | Freq: Every day | ORAL | Status: DC
  Administered 2016-10-16 – 2016-10-19 (×4): 100 ug via ORAL
  Filled 2016-10-15 (×4): qty 1

## 2016-10-15 MED ORDER — MORPHINE SULFATE (PF) 2 MG/ML IV SOLN
0.5000 mg | INTRAVENOUS | Status: DC | PRN
  Administered 2016-10-15 – 2016-10-17 (×5): 0.5 mg via INTRAVENOUS
  Filled 2016-10-15 (×6): qty 1

## 2016-10-15 MED ORDER — LEVOTHYROXINE SODIUM 50 MCG PO TABS
50.0000 ug | ORAL_TABLET | Freq: Every day | ORAL | Status: DC
  Administered 2016-10-16 – 2016-10-19 (×4): 50 ug via ORAL
  Filled 2016-10-15 (×4): qty 1

## 2016-10-15 MED ORDER — ONDANSETRON 4 MG PO TBDP: 4.0000 mg | ORAL_TABLET | Freq: Three times a day (TID) | ORAL | Status: DC | PRN

## 2016-10-15 MED ORDER — CARBIDOPA-LEVODOPA 25-100 MG PO TABS
1.0000 | ORAL_TABLET | Freq: Three times a day (TID) | ORAL | Status: DC
  Administered 2016-10-16 – 2016-10-19 (×9): 1 via ORAL
  Filled 2016-10-15 (×9): qty 1

## 2016-10-15 MED ORDER — LUBIPROSTONE 24 MCG PO CAPS
24.0000 ug | ORAL_CAPSULE | Freq: Every day | ORAL | Status: DC
  Administered 2016-10-16 – 2016-10-19 (×4): 24 ug via ORAL
  Filled 2016-10-15 (×4): qty 1

## 2016-10-15 MED ORDER — METHOCARBAMOL 1000 MG/10ML IJ SOLN
INTRAMUSCULAR | Status: AC
  Filled 2016-10-15: qty 10

## 2016-10-15 MED ORDER — FENTANYL CITRATE (PF) 100 MCG/2ML IJ SOLN
150.0000 ug | INTRAMUSCULAR | Status: DC | PRN
  Administered 2016-10-15 – 2016-10-16 (×4): 150 ug via INTRAVENOUS
  Filled 2016-10-15 (×4): qty 4

## 2016-10-15 MED ORDER — METHOCARBAMOL 1000 MG/10ML IJ SOLN
500.0000 mg | Freq: Four times a day (QID) | INTRAVENOUS | Status: DC | PRN
  Filled 2016-10-15: qty 5

## 2016-10-15 NOTE — H&P (Signed)
History and Physical    Julie Farrell:[id number] DOB: November 16, 1945 DOA: 10/15/2016  PCP: [facility]  Patient coming from:   home  Chief Complaint:  Fall, left hip pain  HPI: Julie Farrell is a 71 y.o. female with medical history significant of HTN, parkinsons, comes in after falling today and hurting her left leg.  The pain was so severe she could not get up so therefore came to the ED.  Pt walks with a walker.  Lives with her daughter.  Had a back injury last week but not from a fall.  Has issues with her legs "giving out".  Otherwise in normal state of health.  Pt referred for admission for left femur fx.  Dr Romeo Apple called and will see pt in am..   Review of Systems: As per HPI otherwise 10 point review of systems negative.   Past Medical History:  Diagnosis Date  . Abnormal pap   . Anxiety   . DDD (degenerative disc disease), lumbar   . Hemorrhoids   . HPV test positive 05/17/2013  . Hyperlipidemia   . Hypertension   . Hypothyroidism   . Parkinson's disease (HCC)   . Thyroid disease   . Vaginal Pap smear, abnormal     Past Surgical History:  Procedure Laterality Date  . BACK SURGERY  10/2015   fusion L4-L 5, Dr Lovell Sheehan  . CATARACT EXTRACTION W/PHACO Left 05/16/2012   Procedure: CATARACT EXTRACTION PHACO AND INTRAOCULAR LENS PLACEMENT (IOC);  Surgeon: Gemma Payor, MD;  Location: [facility];  Service: Ophthalmology;  Laterality: Left;  CDE:15.20  . COLONOSCOPY  2009   [facility]-Dr. Karilyn Cota  . EYE SURGERY Bilateral 14,15   cataracts  . Fatty Tissue Excision Left 2008   [facility]-Dr. Hilda Lias arm  . right ankle Right    fx     reports that she has never smoked. She has never used smokeless tobacco. She reports that she does not drink alcohol or use drugs.  Allergies  Allergen Reactions  . Vicodin [Hydrocodone-Acetaminophen] Other (See Comments)    Upset stomach    Family History  Problem Relation Age of Onset  . Heart attack Mother   . Heart disease Mother         CAD  . Heart disease Brother   . Heart disease Brother   . Hypertension Father   . Stroke Father     Prior to Admission medications   Medication Sig Start Date End Date Taking? Authorizing Provider  ALPRAZolam Prudy Feeler) 0.5 MG tablet as needed. 03/17/16  Yes [provider]  amLODipine (NORVASC) 5 MG tablet Take 5 mg by mouth daily.  01/26/15  Yes [provider]  aspirin EC 81 MG tablet Take 81 mg by mouth daily.   Yes [provider]  escitalopram (LEXAPRO) 10 MG tablet Take 1 tablet (10 mg total) by mouth daily. 08/03/16  Yes Cyril Mourning A, NP  ibuprofen (ADVIL,MOTRIN) 200 MG tablet Take 400 mg by mouth every 6 (six) hours as needed.   Yes [provider]  levothyroxine (SYNTHROID, LEVOTHROID) 50 MCG tablet Take 50 mcg by mouth daily. 06/06/14  Yes [provider]  OMEGA 3-6-9 FATTY ACIDS PO Take 1 tablet by mouth daily.   Yes [provider]  simvastatin (ZOCOR) 20 MG tablet Take 20 mg by mouth at bedtime.   Yes [provider]  vitamin B-12 (CYANOCOBALAMIN) 100 MCG tablet Take 100 mcg by mouth daily.   Yes [provider]  carbidopa-levodopa (SINEMET IR) 25-100 MG tablet Take 1 tablet by mouth 3 (three) times daily before meals. Patient not taking: Reported on 10/15/2016 05/19/16   Penumalli, Glenford Bayley, MD  lubiprostone (AMITIZA) 24 MCG capsule Take 1 capsule (24 mcg total) by mouth daily with breakfast. Patient not taking: Reported on 10/15/2016 08/03/16   Adline Potter, NP  ondansetron (ZOFRAN ODT) 4 MG disintegrating tablet Take 1 tablet (4 mg total) by mouth every 8 (eight) hours as needed for nausea or vomiting. Patient not taking: Reported on 10/15/2016 08/05/16   Samuel Jester, DO    Physical Exam: Vitals:   10/15/16 1217 10/15/16 1513 10/15/16 1630 10/15/16 1800  BP: (!) 103/46 (!) 128/56 129/67 127/62  Pulse: 82 78 85 81  Resp: Temp: 97.9 F (36.6 C)     TempSrc: Oral       SpO2: 96% 95% 98% 97%     Constitutional: NAD, calm, comfortable, very thin appearing Vitals:   10/15/16 1217 10/15/16 1513 10/15/16 1630 10/15/16 1800  BP: (!) 103/46 (!) 128/56 129/67 127/62  Pulse: 82 78 85 81  Resp: Temp: 97.9 F (36.6 C)     TempSrc: Oral     SpO2: 96% 95% 98% 97%   Eyes: PERRL, lids and conjunctivae normal ENMT: Mucous membranes are moist. Posterior pharynx clear of any exudate or lesions.Normal dentition.  Neck: normal, supple, no masses, no thyromegaly Respiratory: clear to auscultation bilaterally, no wheezing, no crackles. Normal respiratory effort. No accessory muscle use.  Cardiovascular: Regular rate and rhythm, no murmurs / rubs / gallops. No extremity edema. 2+ pedal pulses. No carotid bruits.  Abdomen: no tenderness, no masses palpated. No hepatosplenomegaly. Bowel sounds positive.  Musculoskeletal: no clubbing / cyanosis. No joint deformity upper and lower extremities. Good ROM, no contractures. Normal muscle tone.  Skin: no rashes, lesions, ulcers. No induration Neurologic: CN 2-12 grossly intact. Sensation intact, DTR normal. Strength 5/5 in all 4.  Psychiatric: Normal judgment and insight. Alert and oriented x 3. Normal mood.    Labs on Admission: I have personally reviewed following labs and imaging studies  CBC:  Recent Labs Lab 10/15/16 1425  WBC 8.3  NEUTROABS 7.2  HGB 13.2  HCT 39.1  MCV 91.6  PLT 236   Basic Metabolic Panel:  Recent Labs Lab 10/15/16 1425  NA 137  K 3.9  CL 104  CO2 25  GLUCOSE 108*  BUN 19  CREATININE 0.78  CALCIUM 10.1   Coagulation Profile:  Recent Labs Lab 10/15/16 1425  INR 1.01    Radiological Exams on Admission: Dg Chest 1 View  Result Date: 10/15/2016 CLINICAL DATA:  Preop for hip surgery. EXAM: CHEST 1 VIEW COMPARISON:  Radiographs of August 05, 2016. FINDINGS: The heart size and mediastinal contours are within normal limits. Both lungs are clear. No pneumothorax or  pleural effusion is noted. The visualized skeletal structures are unremarkable. IMPRESSION: No acute cardiopulmonary abnormality seen. Electronically Signed   By: Lupita Raider, M.D.   On: 10/15/2016 14:52   Dg Thoracic Spine 2 View  Result Date: 10/15/2016 CLINICAL DATA:  Thoracic spine pain after fall. EXAM: THORACIC SPINE 2 VIEWS COMPARISON:  Radiographs of August 05, 2016. FINDINGS: There is interval development of severe compression deformity T9 vertebral body concerning for acute to subacute fracture. No spondylolisthesis is noted. Disc spaces appear to be well maintained. IMPRESSION: Interval development of severe compression fracture involving T9 vertebral body concerning for acute  to subacute fracture. Further evaluation with MRI is recommended. Electronically Signed   By: Lupita Raider, M.D.   On: 10/15/2016 14:55   Dg Elbow Complete Left  Result Date: 10/15/2016 CLINICAL DATA:  Larey Seat today with left elbow pain EXAM: LEFT ELBOW - COMPLETE 3+ VIEW COMPARISON:  None. FINDINGS: The bones are diffusely osteopenic. Alignment is normal. Joint spaces appear normal. No fracture is seen. No joint effusion is noted. IMPRESSION: Osteopenia.  No acute fracture. Electronically Signed   By: Dwyane Dee M.D.   On: 10/15/2016 13:10   Mr Thoracic Spine Wo Contrast  Result Date: 10/15/2016 CLINICAL DATA:  Fall with severe back pain. History of lumbar fusion. EXAM: MRI THORACIC SPINE WITHOUT CONTRAST TECHNIQUE: Multiplanar, multisequence MR imaging of the thoracic spine was performed. No intravenous contrast was administered. COMPARISON:  Thoracic spine radiograph 10/15/2016 FINDINGS: Alignment: There is exaggerated thoracic kyphosis centered at the T8-T9 level. No static subluxation. Vertebrae: There is an anterior compression fracture of the T9 vertebral body with mild edema. There is approximately 75% anterior height loss. No retropulsion. Signal abnormality in the anterior T8 vertebral body this is probably  a hemangioma. Cord:  Normal signal and morphology. Paraspinal and other soft tissues: Negative. Disc levels: No spinal canal or neural foraminal stenosis of the thoracic spine. IMPRESSION: 1. Acute anterior compression fracture of T9 with approximately 75% height loss anteriorly. No retropulsion. 2. Increased thoracic kyphosis centered at the level of the fracture. 3. Trabeculated T1/T2 hyperintense focus in the anterior T8 vertebral body is suspected to be a hemangioma. Electronically Signed   By: Deatra Robinson M.D.   On: 10/15/2016 17:51   Dg Hip Unilat With Pelvis 2-3 Views Left  Result Date: 10/15/2016 CLINICAL DATA:  Fall.  Hip pain. EXAM: DG HIP (WITH OR WITHOUT PELVIS) 2-3V LEFT COMPARISON:  08/14/2016 . FINDINGS: Angulated fracture noted of the left femoral neck. L4-L5 posterior and interbody fusion. Diffuse osteopenia degenerative change. Aortoiliac atherosclerotic vascular calcification. IMPRESSION: 1. Angulated fracture noted of the left femoral neck. 2. Diffuse osteopenia degenerative change. L4-L5 posterior interbody fusion. Electronically Signed   By: Maisie Fus  Register   On: 10/15/2016 13:10    EKG: Independently reviewed. nonsp IV block, prev LBBB old changes Old chart reviewed Case discussed with EDP cxr reviewed no edema or infiltrate  Assessment/Plan 71 yo female with Mechanical fall has left femur fracture  Principal Problem:   Femur fracture, left (HCC)- hold anticoag and antiplatelet meds.  Npo after midnight.  Morphine prn iv for pain.  Muscle relaxer.  Place foley.  Dr Romeo Apple to take to OR in am.  Pt high risk for surgery.  Will try to minimize medical complications perioperatively.    Active Problems:   Depression- stable   Parkinson's disease (HCC)- stable   Hypertension- stable   Anxiety- stable   Hyperlipidemia- cont statin   Hypothyroidism- cont synthroid   Fall at home, sequela- noted   Compression fracture of body of thoracic vertebra (HCC)- this is stable per  MRI and appears old   Nonspecific intraventricular block, old- noted    DVT prophylaxis:  Scds, no lovenox for now until post op  Code Status:  full Family Communication:  none Disposition Plan:  Per day team Consults called:  Orthopedic surgeon dr Romeo Apple Admission status:  admission   Maggie Senseney A MD [facility]  If 7PM-7AM, please contact night-coverage [email or web address] Password [facility]  10/15/2016, 7:54 PM

## 2016-10-15 NOTE — ED Triage Notes (Signed)
Larey Seat this am, abrasion to left elbow, pain in left hi[p

## 2016-10-15 NOTE — ED Provider Notes (Addendum)
[facility] Provider Note   CSN: [id number] Arrival date & time: 10/15/16  1208     History   Chief Complaint Chief Complaint  Patient presents with  . Fall    HPI Julie Farrell is a 71 y.o. female.  HPI Patient fell from a standing position this morning at 9 AM landing on left hip and left elbow. She complains of mild left hip pain and left elbow pain since the event. Pain is worse with walking or changing positions. Improved with remaining still Elbow pain is minimal. She has been able to walk with assistance since the fall. No other injury. No other associated symptoms. No treatment prior to coming here. Other associated symptoms include midthoracic back pain for the past several days after the fall today for which she has been evaluated by her primary care physician Dr.Fusco. Past Medical History:  Diagnosis Date  . Abnormal pap   . Anxiety   . DDD (degenerative disc disease), lumbar   . Hemorrhoids   . HPV test positive 05/17/2013  . Hyperlipidemia   . Hypertension   . Hypothyroidism   . Parkinson's disease (HCC)   . Thyroid disease   . Vaginal Pap smear, abnormal     Patient Active Problem List   Diagnosis Date Noted  . Pap smear for cervical cancer screening 08/03/2016  . Constipation 08/03/2016  . Vaginal dryness 08/03/2016  . History of HPV infection 08/03/2016  . Depression 08/03/2016  . Spondylolisthesis of lumbar region 10/31/2015  . HPV test positive 05/17/2013  . Cataract 05/11/2012    Past Surgical History:  Procedure Laterality Date  . BACK SURGERY  10/2015   fusion L4-L 5, Dr Lovell Sheehan  . CATARACT EXTRACTION W/PHACO Left 05/16/2012   Procedure: CATARACT EXTRACTION PHACO AND INTRAOCULAR LENS PLACEMENT (IOC);  Surgeon: Gemma Payor, MD;  Location: [facility];  Service: Ophthalmology;  Laterality: Left;  CDE:15.20  . COLONOSCOPY  2009   [facility]-Dr. Karilyn Cota  . EYE SURGERY Bilateral 14,15   cataracts  . Fatty Tissue Excision Left 2008   [facility]-Dr. Hilda Lias  arm  . right ankle Right    fx    OB History    Gravida Para Term Preterm AB Living   SAB TAB Ectopic Multiple Live Births                   Home Medications    Prior to Admission medications   Medication Sig Start Date End Date Taking? Authorizing Provider  ALPRAZolam Prudy Feeler) 0.5 MG tablet as needed. 03/17/16   [provider]  amLODipine (NORVASC) 5 MG tablet Take 5 mg by mouth daily.  01/26/15   [provider]  aspirin EC 81 MG tablet Take 81 mg by mouth daily.    [provider]  carbidopa-levodopa (SINEMET IR) 25-100 MG tablet Take 1 tablet by mouth 3 (three) times daily before meals. Patient not taking: Reported on 07/20/2016 05/19/16   Penumalli, Glenford Bayley, MD  escitalopram (LEXAPRO) 10 MG tablet Take 1 tablet (10 mg total) by mouth daily. 08/03/16   Adline Potter, NP  ibuprofen (ADVIL,MOTRIN) 200 MG tablet Take 400 mg by mouth every 6 (six) hours as needed.    [provider]  levothyroxine (SYNTHROID, LEVOTHROID) 50 MCG tablet Take 50 mcg by mouth daily. 06/06/14   [provider]  lubiprostone (AMITIZA) 24 MCG capsule Take 1 capsule (24 mcg total) by mouth daily with breakfast.  08/03/16   Adline Potter, NP  OMEGA 3-6-9 FATTY ACIDS PO Take 1 tablet by mouth daily.    [provider]  ondansetron (ZOFRAN ODT) 4 MG disintegrating tablet Take 1 tablet (4 mg total) by mouth every 8 (eight) hours as needed for nausea or vomiting. 08/05/16   Samuel Jester, DO  simvastatin (ZOCOR) 20 MG tablet Take 20 mg by mouth at bedtime.    [provider]  vitamin B-12 (CYANOCOBALAMIN) 100 MCG tablet Take 100 mcg by mouth daily.    [provider]    Family History Family History  Problem Relation Age of Onset  . Heart attack Mother   . Heart disease Mother        CAD  . Heart disease Brother   . Heart disease Brother   . Hypertension Father   . Stroke Father     Social History Social  History  Substance Use Topics  . Smoking status: Never Smoker  . Smokeless tobacco: Never Used  . Alcohol use No     Allergies   Vicodin [hydrocodone-acetaminophen]   Review of Systems Review of Systems  Constitutional: Negative.   HENT: Negative.   Respiratory: Negative.   Cardiovascular: Negative.   Gastrointestinal: Negative.   Musculoskeletal: Positive for arthralgias, back pain and gait problem.       Walks with walker  Skin: Positive for wound.       Abrasion at left elbow  Allergic/Immunologic:       Last tetanus immunization less than 10 years ago  Psychiatric/Behavioral: Negative.   All other systems reviewed and are negative.    Physical Exam Updated Vital Signs BP (!) 103/46   Pulse 82   Temp 97.9 F (36.6 C) (Oral)   Resp 20   SpO2 96%   Physical Exam  Constitutional:  Chronically ill-appearing, frail  HENT:  Head: Normocephalic and atraumatic.  Eyes: Pupils are equal, round, and reactive to light. Conjunctivae are normal.  Neck: Neck supple. No tracheal deviation present. No thyromegaly present.  Cardiovascular: Normal rate and regular rhythm.   No murmur heard. Pulmonary/Chest: Effort normal and breath sounds normal.  Abdominal: Soft. Bowel sounds are normal. She exhibits no distension. There is no tenderness.  Musculoskeletal: Normal range of motion. She exhibits no edema or tenderness.  Entire spine nontender. Left upper extremity there is a dime-sized abrasion over the posterior elbow without deformity or swelling or point tenderness. Full range of motion. Radial pulse 2+ good capillary refill. Left lower extremity is tender at the hip. No obvious deformity. DP pulse 2+. Good capillary refill. All other extremities no contusion abrasion or tenderness neurovascular intact  Neurological: She is alert. Coordination normal.  Skin: Skin is warm and dry. No rash noted.  Psychiatric: She has a normal mood and affect.  Nursing note and vitals  reviewed.    ED Treatments / Results  Labs (all labs ordered are listed, but only abnormal results are displayed) Labs Reviewed  BASIC METABOLIC PANEL  CBC WITH DIFFERENTIAL/PLATELET  PROTIME-INR  TYPE AND SCREEN    EKG  EKG Interpretation None       Radiology Dg Elbow Complete Left  Result Date: 10/15/2016 CLINICAL DATA:  Larey Seat today with left elbow pain EXAM: LEFT ELBOW - COMPLETE 3+ VIEW COMPARISON:  None. FINDINGS: The bones are diffusely osteopenic. Alignment is normal. Joint spaces appear normal. No fracture is seen. No joint effusion is noted. IMPRESSION: Osteopenia.  No acute fracture. Electronically Signed   By: Renae Fickle  Gery Pray M.D.   On: 10/15/2016 13:10   Dg Hip Unilat With Pelvis 2-3 Views Left  Result Date: 10/15/2016 CLINICAL DATA:  Fall.  Hip pain. EXAM: DG HIP (WITH OR WITHOUT PELVIS) 2-3V LEFT COMPARISON:  08/14/2016 . FINDINGS: Angulated fracture noted of the left femoral neck. L4-L5 posterior and interbody fusion. Diffuse osteopenia degenerative change. Aortoiliac atherosclerotic vascular calcification. IMPRESSION: 1. Angulated fracture noted of the left femoral neck. 2. Diffuse osteopenia degenerative change. L4-L5 posterior interbody fusion. Electronically Signed   By: Maisie Fus  Register   On: 10/15/2016 13:10    Procedures Procedures (including critical care time)  Medications Ordered in ED Medications - No data to display   Declines pain medicine.   3 PM requesting pain medicine. IV fentanyl ordered. Dr. Romeo Apple from orthopedic service consulted. He will see patient in hospital and plans to operate tomorrow Results for orders placed or performed during the hospital encounter of 10/15/16  Basic metabolic panel  Result Value Ref Range   Sodium 137 135 - 145 mmol/L   Potassium 3.9 3.5 - 5.1 mmol/L   Chloride 104 101 - 111 mmol/L   CO2 25 22 - 32 mmol/L   Glucose, Bld 108 (H) 65 - 99 mg/dL   BUN 19 6 - 20 mg/dL   Creatinine, Ser 1.61 0.44 - 1.00 mg/dL    Calcium 09.6 8.9 - 04.5 mg/dL   GFR calc non Af Amer >60 >60 mL/min   GFR calc Af Amer >60 >60 mL/min   Anion gap 8 5 - 15  CBC WITH DIFFERENTIAL  Result Value Ref Range   WBC 8.3 4.0 - 10.5 K/uL   RBC 4.27 3.87 - 5.11 MIL/uL   Hemoglobin 13.2 12.0 - 15.0 g/dL   HCT 40.9 81.1 - 91.4 %   MCV 91.6 78.0 - 100.0 fL   MCH 30.9 26.0 - 34.0 pg   MCHC 33.8 30.0 - 36.0 g/dL   RDW 78.2 95.6 - 21.3 %   Platelets 236 150 - 400 K/uL   Neutrophils Relative % 86 %   Neutro Abs 7.2 1.7 - 7.7 K/uL   Lymphocytes Relative 7 %   Lymphs Abs 0.6 (L) 0.7 - 4.0 K/uL   Monocytes Relative 6 %   Monocytes Absolute 0.5 0.1 - 1.0 K/uL   Eosinophils Relative 1 %   Eosinophils Absolute 0.0 0.0 - 0.7 K/uL   Basophils Relative 0 %   Basophils Absolute 0.0 0.0 - 0.1 K/uL  Protime-INR  Result Value Ref Range   Prothrombin Time 13.2 11.4 - 15.2 seconds   INR 1.01   Type and screen [facility]  Result Value Ref Range   ABO/RH(D) A POS    Antibody Screen NEG    Sample Expiration 10/18/2016    Dg Chest 1 View  Result Date: 10/15/2016 CLINICAL DATA:  Preop for hip surgery. EXAM: CHEST 1 VIEW COMPARISON:  Radiographs of August 05, 2016. FINDINGS: The heart size and mediastinal contours are within normal limits. Both lungs are clear. No pneumothorax or pleural effusion is noted. The visualized skeletal structures are unremarkable. IMPRESSION: No acute cardiopulmonary abnormality seen. Electronically Signed   By: Lupita Raider, M.D.   On: 10/15/2016 14:52   Dg Thoracic Spine 2 View  Result Date: 10/15/2016 CLINICAL DATA:  Thoracic spine pain after fall. EXAM: THORACIC SPINE 2 VIEWS COMPARISON:  Radiographs of August 05, 2016. FINDINGS: There is interval development of severe compression deformity T9 vertebral body concerning for acute to subacute fracture. No spondylolisthesis  is noted. Disc spaces appear to be well maintained. IMPRESSION: Interval development of severe compression fracture involving T9  vertebral body concerning for acute to subacute fracture. Further evaluation with MRI is recommended. Electronically Signed   By: Lupita Raider, M.D.   On: 10/15/2016 14:55   Dg Elbow Complete Left  Result Date: 10/15/2016 CLINICAL DATA:  Larey Seat today with left elbow pain EXAM: LEFT ELBOW - COMPLETE 3+ VIEW COMPARISON:  None. FINDINGS: The bones are diffusely osteopenic. Alignment is normal. Joint spaces appear normal. No fracture is seen. No joint effusion is noted. IMPRESSION: Osteopenia.  No acute fracture. Electronically Signed   By: Dwyane Dee M.D.   On: 10/15/2016 13:10   Mr Thoracic Spine Wo Contrast  Result Date: 10/15/2016 CLINICAL DATA:  Fall with severe back pain. History of lumbar fusion. EXAM: MRI THORACIC SPINE WITHOUT CONTRAST TECHNIQUE: Multiplanar, multisequence MR imaging of the thoracic spine was performed. No intravenous contrast was administered. COMPARISON:  Thoracic spine radiograph 10/15/2016 FINDINGS: Alignment: There is exaggerated thoracic kyphosis centered at the T8-T9 level. No static subluxation. Vertebrae: There is an anterior compression fracture of the T9 vertebral body with mild edema. There is approximately 75% anterior height loss. No retropulsion. Signal abnormality in the anterior T8 vertebral body this is probably a hemangioma. Cord:  Normal signal and morphology. Paraspinal and other soft tissues: Negative. Disc levels: No spinal canal or neural foraminal stenosis of the thoracic spine. IMPRESSION: 1. Acute anterior compression fracture of T9 with approximately 75% height loss anteriorly. No retropulsion. 2. Increased thoracic kyphosis centered at the level of the fracture. 3. Trabeculated T1/T2 hyperintense focus in the anterior T8 vertebral body is suspected to be a hemangioma. Electronically Signed   By: Deatra Robinson M.D.   On: 10/15/2016 17:51   Dg Hip Unilat With Pelvis 2-3 Views Left  Result Date: 10/15/2016 CLINICAL DATA:  Fall.  Hip pain. EXAM: DG HIP  (WITH OR WITHOUT PELVIS) 2-3V LEFT COMPARISON:  08/14/2016 . FINDINGS: Angulated fracture noted of the left femoral neck. L4-L5 posterior and interbody fusion. Diffuse osteopenia degenerative change. Aortoiliac atherosclerotic vascular calcification. IMPRESSION: 1. Angulated fracture noted of the left femoral neck. 2. Diffuse osteopenia degenerative change. L4-L5 posterior interbody fusion. Electronically Signed   By: Maisie Fus  Register   On: 10/15/2016 13:10   Initial Impression / Assessment and Plan / ED Course  I have reviewed the triage vital signs and the nursing notes.  Pertinent labs & imaging results that were available during my care of the patient were reviewed by me and considered in my medical decision making (see chart for details).     3:40 PM pain improved and controlled after treatment with intravenous fentanyl. Case discussed with radiologist. T9 compression fracture felt to be stable.I also advised Dr. Romeo Apple from orthopedic service of compression fracture. He is comfortable with keeping patient here  6:50 PM patient resting comfortably after treatment withmultiple doses of intravenous fentanyl  Dr. Onalee Hua from hospital service consulted and will arrange for admission  Final Clinical Impressions(s) / ED Diagnoses  Diagnosis #1 acute closed left femoral neck hip fracture #2 acute T9 compression fracture Final diagnoses:  None  #3 fall  New Prescriptions New Prescriptions   No medications on file     Doug Sou, MD 10/15/16 1931    Doug Sou, MD 10/15/16 [phone]

## 2016-10-15 NOTE — ED Notes (Signed)
Have started pt on 2L [place]. O2 sats had dropped to 90%. O2 now up to 96%

## 2016-10-15 NOTE — ED Notes (Signed)
Pt to MRI

## 2016-10-16 ENCOUNTER — Inpatient Hospital Stay (HOSPITAL_COMMUNITY): Payer: Medicare HMO | Admitting: Anesthesiology

## 2016-10-16 ENCOUNTER — Inpatient Hospital Stay (HOSPITAL_COMMUNITY): Payer: Medicare HMO

## 2016-10-16 ENCOUNTER — Encounter (HOSPITAL_COMMUNITY): Payer: Self-pay | Admitting: *Deleted

## 2016-10-16 ENCOUNTER — Encounter (HOSPITAL_COMMUNITY)
Admission: EM | Disposition: A | Discharge status: Skilled Nursing Facility | Payer: Self-pay | Source: Home / Self Care | Attending: Family Medicine

## 2016-10-16 DIAGNOSIS — S7292XA Unspecified fracture of left femur, initial encounter for closed fracture: Secondary | ICD-10-CM

## 2016-10-16 DIAGNOSIS — E039 Hypothyroidism, unspecified: Secondary | ICD-10-CM

## 2016-10-16 DIAGNOSIS — S22009A Unspecified fracture of unspecified thoracic vertebra, initial encounter for closed fracture: Secondary | ICD-10-CM

## 2016-10-16 HISTORY — PX: HIP ARTHROPLASTY: SHX981

## 2016-10-16 LAB — CBC
HEMATOCRIT: 37 % (ref 36.0–46.0)
Hemoglobin: 12.5 g/dL (ref 12.0–15.0)
MCH: 31 pg (ref 26.0–34.0)
MCHC: 33.8 g/dL (ref 30.0–36.0)
MCV: 91.8 fL (ref 78.0–100.0)
PLATELETS: 216 10*3/uL (ref 150–400)
RBC: 4.03 MIL/uL (ref 3.87–5.11)
RDW: 12.6 % (ref 11.5–15.5)
WBC: 7.6 10*3/uL (ref 4.0–10.5)

## 2016-10-16 LAB — BASIC METABOLIC PANEL
Anion gap: 8 (ref 5–15)
BUN: 16 mg/dL (ref 6–20)
CHLORIDE: 104 mmol/L (ref 101–111)
CO2: 27 mmol/L (ref 22–32)
Calcium: 9.8 mg/dL (ref 8.9–10.3)
Creatinine, Ser: 0.68 mg/dL (ref 0.44–1.00)
GFR calc Af Amer: >60 mL/min (ref >60)
GFR calc non Af Amer: >60 mL/min (ref >60)
GLUCOSE: 115 mg/dL — AB (ref 65–99)
POTASSIUM: 4 mmol/L (ref 3.5–5.1)
Sodium: 139 mmol/L (ref 135–145)

## 2016-10-16 LAB — SURGICAL PCR SCREEN
MRSA, PCR: NEGATIVE
Staphylococcus aureus: NEGATIVE

## 2016-10-16 LAB — ABO/RH: ABO/RH(D): A POS

## 2016-10-16 SURGERY — HEMIARTHROPLASTY, HIP, DIRECT ANTERIOR APPROACH, FOR FRACTURE
Anesthesia: General | Site: Hip | Laterality: Left

## 2016-10-16 MED ORDER — BUPIVACAINE-EPINEPHRINE (PF) 0.5% -1:200000 IJ SOLN
INTRAMUSCULAR | Status: AC
Start: 2016-10-16 — End: 2016-10-16
  Filled 2016-10-16: qty 30

## 2016-10-16 MED ORDER — FENTANYL CITRATE (PF) 100 MCG/2ML IJ SOLN
INTRAMUSCULAR | Status: DC | PRN
  Administered 2016-10-16 (×4): 25 ug via INTRAVENOUS

## 2016-10-16 MED ORDER — SODIUM CHLORIDE 0.9 % IV SOLN
INTRAVENOUS | Status: DC
  Administered 2016-10-16 – 2016-10-17 (×2): via INTRAVENOUS

## 2016-10-16 MED ORDER — ASPIRIN EC 325 MG PO TBEC
325.0000 mg | DELAYED_RELEASE_TABLET | Freq: Every day | ORAL | Status: DC
  Administered 2016-10-17 – 2016-10-19 (×3): 325 mg via ORAL
  Filled 2016-10-16 (×4): qty 1

## 2016-10-16 MED ORDER — 0.9 % SODIUM CHLORIDE (POUR BTL) OPTIME
TOPICAL | Status: DC | PRN
  Administered 2016-10-16: 1000 mL

## 2016-10-16 MED ORDER — LIDOCAINE HCL (PF) 1 % IJ SOLN
INTRAMUSCULAR | Status: AC
  Filled 2016-10-16: qty 5

## 2016-10-16 MED ORDER — MIDAZOLAM HCL 2 MG/2ML IJ SOLN
1.0000 mg | INTRAMUSCULAR | Status: DC
  Administered 2016-10-16: 2 mg via INTRAVENOUS
  Filled 2016-10-16: qty 2

## 2016-10-16 MED ORDER — LACTATED RINGERS IV SOLN
INTRAVENOUS | Status: DC
  Administered 2016-10-16 (×2): via INTRAVENOUS

## 2016-10-16 MED ORDER — ONDANSETRON HCL 4 MG/2ML IJ SOLN
4.0000 mg | Freq: Once | INTRAMUSCULAR | Status: AC
  Administered 2016-10-16: 4 mg via INTRAVENOUS
  Filled 2016-10-16: qty 2

## 2016-10-16 MED ORDER — PHENOL 1.4 % MT LIQD: 1.0000 | OROMUCOSAL | Status: DC | PRN

## 2016-10-16 MED ORDER — SODIUM CHLORIDE 0.9 % IJ SOLN
INTRAMUSCULAR | Status: AC
  Filled 2016-10-16: qty 10

## 2016-10-16 MED ORDER — CEFAZOLIN SODIUM-DEXTROSE 2-4 GM/100ML-% IV SOLN
INTRAVENOUS | Status: AC
  Filled 2016-10-16: qty 100

## 2016-10-16 MED ORDER — PROPOFOL 10 MG/ML IV BOLUS
INTRAVENOUS | Status: AC
Start: 2016-10-16 — End: 2016-10-16
  Filled 2016-10-16: qty 20

## 2016-10-16 MED ORDER — ONDANSETRON HCL 4 MG/2ML IJ SOLN
4.0000 mg | Freq: Four times a day (QID) | INTRAMUSCULAR | Status: DC | PRN
  Administered 2016-10-17 – 2016-10-18 (×4): 4 mg via INTRAVENOUS
  Filled 2016-10-16 (×4): qty 2

## 2016-10-16 MED ORDER — ROCURONIUM BROMIDE 50 MG/5ML IV SOLN
INTRAVENOUS | Status: AC
Start: 2016-10-16 — End: 2016-10-16
  Filled 2016-10-16: qty 1

## 2016-10-16 MED ORDER — FENTANYL CITRATE (PF) 100 MCG/2ML IJ SOLN
INTRAMUSCULAR | Status: AC
  Filled 2016-10-16: qty 2

## 2016-10-16 MED ORDER — BUPIVACAINE-EPINEPHRINE (PF) 0.5% -1:200000 IJ SOLN
INTRAMUSCULAR | Status: DC | PRN
  Administered 2016-10-16: 30 mL via PERINEURAL

## 2016-10-16 MED ORDER — CHLORHEXIDINE GLUCONATE 4 % EX LIQD
60.0000 mL | Freq: Once | CUTANEOUS | Status: AC
  Administered 2016-10-16: 4 via TOPICAL
  Filled 2016-10-16: qty 60

## 2016-10-16 MED ORDER — METOCLOPRAMIDE HCL 5 MG/ML IJ SOLN
5.0000 mg | Freq: Three times a day (TID) | INTRAMUSCULAR | Status: DC | PRN
  Filled 2016-10-16: qty 2

## 2016-10-16 MED ORDER — EPHEDRINE SULFATE 50 MG/ML IJ SOLN
INTRAMUSCULAR | Status: AC
  Filled 2016-10-16: qty 1

## 2016-10-16 MED ORDER — ONDANSETRON HCL 4 MG PO TABS: 4.0000 mg | ORAL_TABLET | Freq: Four times a day (QID) | ORAL | Status: DC | PRN

## 2016-10-16 MED ORDER — DEXAMETHASONE SODIUM PHOSPHATE 4 MG/ML IJ SOLN
4.0000 mg | Freq: Once | INTRAMUSCULAR | Status: AC
  Administered 2016-10-16: 4 mg via INTRAVENOUS
  Filled 2016-10-16: qty 1

## 2016-10-16 MED ORDER — POVIDONE-IODINE 10 % EX SWAB
2.0000 "application " | Freq: Once | CUTANEOUS | Status: AC
  Administered 2016-10-16: 2 via TOPICAL

## 2016-10-16 MED ORDER — GLYCOPYRROLATE 0.2 MG/ML IJ SOLN
INTRAMUSCULAR | Status: DC | PRN
  Administered 2016-10-16: 0.2 mg via INTRAVENOUS

## 2016-10-16 MED ORDER — CEFAZOLIN SODIUM-DEXTROSE 2-4 GM/100ML-% IV SOLN
2.0000 g | INTRAVENOUS | Status: AC
  Administered 2016-10-16: 2 g via INTRAVENOUS
  Filled 2016-10-16: qty 100

## 2016-10-16 MED ORDER — ROCURONIUM BROMIDE 50 MG/5ML IV SOLN
INTRAVENOUS | Status: AC
  Filled 2016-10-16: qty 2

## 2016-10-16 MED ORDER — DEXAMETHASONE SODIUM PHOSPHATE 4 MG/ML IJ SOLN
INTRAMUSCULAR | Status: AC
  Filled 2016-10-16: qty 1

## 2016-10-16 MED ORDER — EPHEDRINE SULFATE 50 MG/ML IJ SOLN
INTRAMUSCULAR | Status: DC | PRN
  Administered 2016-10-16: 5 mg via INTRAVENOUS

## 2016-10-16 MED ORDER — MENTHOL 3 MG MT LOZG: 1.0000 | LOZENGE | OROMUCOSAL | Status: DC | PRN

## 2016-10-16 MED ORDER — BUPIVACAINE-EPINEPHRINE (PF) 0.5% -1:200000 IJ SOLN
INTRAMUSCULAR | Status: AC
  Filled 2016-10-16: qty 30

## 2016-10-16 MED ORDER — GLYCOPYRROLATE 0.2 MG/ML IJ SOLN
INTRAMUSCULAR | Status: AC
  Filled 2016-10-16: qty 1

## 2016-10-16 MED ORDER — SUCCINYLCHOLINE CHLORIDE 20 MG/ML IJ SOLN
INTRAMUSCULAR | Status: AC
  Filled 2016-10-16: qty 1

## 2016-10-16 MED ORDER — SUGAMMADEX SODIUM 500 MG/5ML IV SOLN
INTRAVENOUS | Status: AC
  Filled 2016-10-16: qty 5

## 2016-10-16 MED ORDER — STERILE WATER FOR IRRIGATION IR SOLN
Status: DC | PRN
  Administered 2016-10-16 (×2): 1000 mL

## 2016-10-16 MED ORDER — ROCURONIUM BROMIDE 100 MG/10ML IV SOLN
INTRAVENOUS | Status: DC | PRN
  Administered 2016-10-16: 5 mg via INTRAVENOUS
  Administered 2016-10-16: 20 mg via INTRAVENOUS

## 2016-10-16 MED ORDER — SODIUM CHLORIDE 0.9 % IR SOLN
Status: DC | PRN
  Administered 2016-10-16: 3000 mL

## 2016-10-16 MED ORDER — FENTANYL CITRATE (PF) 100 MCG/2ML IJ SOLN: 25.0000 ug | INTRAMUSCULAR | Status: DC | PRN

## 2016-10-16 MED ORDER — SUCCINYLCHOLINE CHLORIDE 20 MG/ML IJ SOLN
INTRAMUSCULAR | Status: DC | PRN
  Administered 2016-10-16: 100 mg via INTRAVENOUS

## 2016-10-16 MED ORDER — SODIUM CHLORIDE 0.9 % IV SOLN: Freq: Once | INTRAVENOUS | Status: DC

## 2016-10-16 MED ORDER — METOCLOPRAMIDE HCL 10 MG PO TABS: 5.0000 mg | ORAL_TABLET | Freq: Three times a day (TID) | ORAL | Status: DC | PRN

## 2016-10-16 MED ORDER — GLYCOPYRROLATE 0.2 MG/ML IJ SOLN
INTRAMUSCULAR | Status: AC
  Filled 2016-10-16: qty 2

## 2016-10-16 MED ORDER — NEOSTIGMINE METHYLSULFATE 10 MG/10ML IV SOLN
INTRAVENOUS | Status: DC | PRN
  Administered 2016-10-16: 1 mg via INTRAVENOUS

## 2016-10-16 MED ORDER — PROPOFOL 10 MG/ML IV BOLUS
INTRAVENOUS | Status: DC | PRN
Start: 2016-10-16 — End: 2016-10-16
  Administered 2016-10-16: 70 mg via INTRAVENOUS
  Administered 2016-10-16 (×2): 10 mg via INTRAVENOUS

## 2016-10-16 MED ORDER — NEOSTIGMINE METHYLSULFATE 10 MG/10ML IV SOLN
INTRAVENOUS | Status: AC
  Filled 2016-10-16: qty 1

## 2016-10-16 SURGICAL SUPPLY — 64 items
BAG HAMPER (MISCELLANEOUS) ×3 IMPLANT
BIT DRILL 2.8X128 (BIT) ×1 IMPLANT
BIT DRILL 2.8X128MM (BIT)
BLADE 10 SAFETY STRL DISP (BLADE) ×3 IMPLANT
BLADE HEX COATED 2.75 (ELECTRODE) ×3 IMPLANT
BLADE SAGITTAL 25.0X1.27X90 (BLADE) ×2 IMPLANT
BLADE SAGITTAL 25.0X1.27X90MM (BLADE) ×1
BRUSH FEMORAL CANAL (MISCELLANEOUS) IMPLANT
CAPT HIP HEMI 1 ×3 IMPLANT
CHLORAPREP W/TINT 26ML (MISCELLANEOUS) ×3 IMPLANT
CLOTH BEACON ORANGE TIMEOUT ST (SAFETY) ×3 IMPLANT
COVER LIGHT HANDLE STERIS (MISCELLANEOUS) ×6 IMPLANT
COVER PROBE W GEL 5X96 (DRAPES) ×3 IMPLANT
DECANTER SPIKE VIAL GLASS SM (MISCELLANEOUS) ×6 IMPLANT
DRAPE HIP W/POCKET STRL (DRAPE) ×3 IMPLANT
DRAPE U-SHAPE 47X51 STRL (DRAPES) ×3 IMPLANT
DRSG MEPILEX BORDER 4X12 (GAUZE/BANDAGES/DRESSINGS) ×3 IMPLANT
ELECT REM PT RETURN 9FT ADLT (ELECTROSURGICAL) ×3
ELECTRODE REM PT RTRN 9FT ADLT (ELECTROSURGICAL) ×1 IMPLANT
EVACUATOR 3/16  PVC DRAIN (DRAIN)
EVACUATOR 3/16 PVC DRAIN (DRAIN) IMPLANT
FACESHIELD LNG OPTICON STERILE (SAFETY) ×3 IMPLANT
GLOVE BIOGEL M 6.5 STRL (GLOVE) ×6 IMPLANT
GLOVE BIOGEL M STER SZ 6 (GLOVE) ×2 IMPLANT
GLOVE BIOGEL M STRL SZ7.5 (GLOVE) ×3 IMPLANT
GLOVE BIOGEL PI IND STRL 7.0 (GLOVE) ×2 IMPLANT
GLOVE BIOGEL PI INDICATOR 7.0 (GLOVE) ×4
GLOVE ECLIPSE 7.0 STRL STRAW (GLOVE) ×3 IMPLANT
GLOVE SKINSENSE NS SZ8.0 LF (GLOVE) ×4
GLOVE SKINSENSE STRL SZ8.0 LF (GLOVE) ×2 IMPLANT
GLOVE SS N UNI LF 8.5 STRL (GLOVE) ×3 IMPLANT
GOWN STRL REUS W/TWL LRG LVL3 (GOWN DISPOSABLE) ×6 IMPLANT
GOWN STRL REUS W/TWL XL LVL3 (GOWN DISPOSABLE) ×3 IMPLANT
HANDPIECE INTERPULSE COAX TIP (DISPOSABLE) ×3
INST SET MAJOR BONE (KITS) ×3 IMPLANT
IV NS IRRIG 3000ML ARTHROMATIC (IV SOLUTION) ×2 IMPLANT
KIT BLADEGUARD II DBL (SET/KITS/TRAYS/PACK) ×3 IMPLANT
KIT ROOM TURNOVER APOR (KITS) ×3 IMPLANT
MANIFOLD NEPTUNE II (INSTRUMENTS) ×3 IMPLANT
MARKER SKIN DUAL TIP RULER LAB (MISCELLANEOUS) ×3 IMPLANT
NEEDLE HYPO 21X1.5 SAFETY (NEEDLE) ×3 IMPLANT
NS IRRIG 1000ML POUR BTL (IV SOLUTION) ×3 IMPLANT
PACK TOTAL JOINT (CUSTOM PROCEDURE TRAY) ×3 IMPLANT
PAD ARMBOARD 7.5X6 YLW CONV (MISCELLANEOUS) ×3 IMPLANT
PASSER SUT SWANSON 36MM LOOP (INSTRUMENTS) IMPLANT
PILLOW HIP ABDUCTION LRG (ORTHOPEDIC SUPPLIES) IMPLANT
PILLOW HIP ABDUCTION MED (ORTHOPEDIC SUPPLIES) ×3 IMPLANT
PIN STMN SNGL STERILE 9X3.6MM (PIN) ×6 IMPLANT
SET BASIN LINEN APH (SET/KITS/TRAYS/PACK) ×3 IMPLANT
SET HNDPC FAN SPRY TIP SCT (DISPOSABLE) ×1 IMPLANT
STAPLER VISISTAT 35W (STAPLE) ×3 IMPLANT
SUT BRALON NAB BRD #1 30IN (SUTURE) ×3 IMPLANT
SUT ETHIBOND 5 LR DA (SUTURE) ×6 IMPLANT
SUT MNCRL 0 VIOLET CTX 36 (SUTURE) ×1 IMPLANT
SUT MON AB 2-0 CT1 36 (SUTURE) ×3 IMPLANT
SUT MONOCRYL 0 CTX 36 (SUTURE) ×2
SUT VIC AB 1 CT1 27 (SUTURE) ×9
SUT VIC AB 1 CT1 27XBRD ANTBC (SUTURE) ×3 IMPLANT
SYR 30ML LL (SYRINGE) ×3 IMPLANT
SYR BULB IRRIGATION 50ML (SYRINGE) ×3 IMPLANT
TOWER CARTRIDGE SMART MIX (DISPOSABLE) IMPLANT
TRAY FOLEY CATH SILVER 16FR (SET/KITS/TRAYS/PACK) IMPLANT
WATER STERILE IRR 1000ML POUR (IV SOLUTION) ×6 IMPLANT
YANKAUER SUCT 12FT TUBE ARGYLE (SUCTIONS) ×3 IMPLANT

## 2016-10-16 NOTE — Progress Notes (Signed)
PROGRESS NOTE    Julie Farrell  WJX:[id number]  DOB: 1945-04-13  DOA: 10/15/2016 PCP: [facility]  Brief Admission Hx: Julie Farrell is a 71 y.o. female with medical history significant of HTN, parkinsons, comes in after falling today and hurting her left leg.  The pain was so severe she could not get up so therefore came to the ED.  Pt walks with a walker.  Lives with her daughter.  Had a back injury last week but not from a fall.  Has issues with her legs "giving out".  Otherwise in normal state of health.  Pt referred for admission for left femur fx.   MDM/Assessment & Plan:   1. Acute left femur fracture - Pt was seen by orthopedist and is being taken to OR later today.  Holding blood thinners and keep NPO.  Pain management.  2. Parkinson's Disease - stable  3. Hypothyroidism - resume home levothyroxine.  4. Hyperlipidemia - stable, resume home meds.   DVT prophylaxis: SCDs Code Status: FULL  Family Communication: bedside Disposition Plan: TBD   Consultants:  orthopedics  Subjective: Pt awaiting OR later today, pain controlled.   Objective: Vitals:   10/15/16 1800 10/15/16 2059 10/16/16 0620 10/16/16 1157  BP: 127/62 122/64 (!) 145/61 (!) 143/71  Pulse: 81 86 87 74  Resp: Temp:  98 F (36.7 C) 98.8 F (37.1 C) 98.9 F (37.2 C)  TempSrc:  Oral Oral Oral  SpO2: 97% 96% 96% 93%  Weight:  43.5 kg (95 lb 12.8 oz)  43.1 kg (95 lb)  Height:   (1.575 m)   (1.575 m)    Intake/Output Summary (Last 24 hours) at 10/16/16 1222 Last data filed at 10/16/16 1100  Gross per 24 hour  Intake              420 ml  Output              550 ml  Net             -130 ml   Filed Weights   10/15/16 2059 10/16/16 1157  Weight: 43.5 kg (95 lb 12.8 oz) 43.1 kg (95 lb)     REVIEW OF SYSTEMS  As per history otherwise all reviewed and reported negative  Exam:  General exam: awake, alert, NAD. Cooperative.  Respiratory system:  No  increased work of breathing. Cardiovascular system: S1 & S2 heard.  No JVD, murmurs, gallops, clicks or pedal edema. Gastrointestinal system: Abdomen is nondistended, soft and nontender. Normal bowel sounds heard. Central nervous system: Alert and oriented. No focal neurological deficits. Extremities: good perfusion noted.   Data Reviewed: Basic Metabolic Panel:  Recent Labs Lab 10/15/16 1425 10/16/16 0638  NA 137 139  K 3.9 4.0  CL 104 104  CO2 25 27  GLUCOSE 108* 115*  BUN 19 16  CREATININE 0.78 0.68  CALCIUM 10.1 9.8   Liver Function Tests: No results for input(s): AST, ALT, ALKPHOS, BILITOT, PROT, ALBUMIN in the last 168 hours. No results for input(s): LIPASE, AMYLASE in the last 168 hours. No results for input(s): AMMONIA in the last 168 hours. CBC:  Recent Labs Lab 10/15/16 1425 10/16/16 0638  WBC 8.3 7.6  NEUTROABS 7.2  --   HGB 13.2 12.5  HCT 39.1 37.0  MCV 91.6 91.8  PLT 236 216   Cardiac Enzymes: No results for input(s): CKTOTAL, CKMB, CKMBINDEX, TROPONINI in the last 168 hours. CBG (last  3)  No results for input(s): GLUCAP in the last 72 hours. Recent Results (from the past 240 hour(s))  Surgical pcr screen     Status: None   Collection Time: 10/15/16 10:13 PM  Result Value Ref Range Status   MRSA, PCR NEGATIVE NEGATIVE Final   Staphylococcus aureus NEGATIVE NEGATIVE Final    Comment: (NOTE) The Xpert SA Assay (FDA approved for NASAL specimens in patients 65 years of age and older), is one component of a comprehensive surveillance program. It is not intended to diagnose infection nor to guide or monitor treatment.      Studies: Dg Chest 1 View  Result Date: 10/15/2016 CLINICAL DATA:  Preop for hip surgery. EXAM: CHEST 1 VIEW COMPARISON:  Radiographs of August 05, 2016. FINDINGS: The heart size and mediastinal contours are within normal limits. Both lungs are clear. No pneumothorax or pleural effusion is noted. The visualized skeletal structures are  unremarkable. IMPRESSION: No acute cardiopulmonary abnormality seen. Electronically Signed   By: Lupita Raider, M.D.   On: 10/15/2016 14:52   Dg Thoracic Spine 2 View  Result Date: 10/15/2016 CLINICAL DATA:  Thoracic spine pain after fall. EXAM: THORACIC SPINE 2 VIEWS COMPARISON:  Radiographs of August 05, 2016. FINDINGS: There is interval development of severe compression deformity T9 vertebral body concerning for acute to subacute fracture. No spondylolisthesis is noted. Disc spaces appear to be well maintained. IMPRESSION: Interval development of severe compression fracture involving T9 vertebral body concerning for acute to subacute fracture. Further evaluation with MRI is recommended. Electronically Signed   By: Lupita Raider, M.D.   On: 10/15/2016 14:55   Dg Elbow Complete Left  Result Date: 10/15/2016 CLINICAL DATA:  Larey Seat today with left elbow pain EXAM: LEFT ELBOW - COMPLETE 3+ VIEW COMPARISON:  None. FINDINGS: The bones are diffusely osteopenic. Alignment is normal. Joint spaces appear normal. No fracture is seen. No joint effusion is noted. IMPRESSION: Osteopenia.  No acute fracture. Electronically Signed   By: Dwyane Dee M.D.   On: 10/15/2016 13:10   Mr Thoracic Spine Wo Contrast  Result Date: 10/15/2016 CLINICAL DATA:  Fall with severe back pain. History of lumbar fusion. EXAM: MRI THORACIC SPINE WITHOUT CONTRAST TECHNIQUE: Multiplanar, multisequence MR imaging of the thoracic spine was performed. No intravenous contrast was administered. COMPARISON:  Thoracic spine radiograph 10/15/2016 FINDINGS: Alignment: There is exaggerated thoracic kyphosis centered at the T8-T9 level. No static subluxation. Vertebrae: There is an anterior compression fracture of the T9 vertebral body with mild edema. There is approximately 75% anterior height loss. No retropulsion. Signal abnormality in the anterior T8 vertebral body this is probably a hemangioma. Cord:  Normal signal and morphology. Paraspinal and  other soft tissues: Negative. Disc levels: No spinal canal or neural foraminal stenosis of the thoracic spine. IMPRESSION: 1. Acute anterior compression fracture of T9 with approximately 75% height loss anteriorly. No retropulsion. 2. Increased thoracic kyphosis centered at the level of the fracture. 3. Trabeculated T1/T2 hyperintense focus in the anterior T8 vertebral body is suspected to be a hemangioma. Electronically Signed   By: Deatra Robinson M.D.   On: 10/15/2016 17:51   Dg Hip Unilat With Pelvis 2-3 Views Left  Result Date: 10/15/2016 CLINICAL DATA:  Fall.  Hip pain. EXAM: DG HIP (WITH OR WITHOUT PELVIS) 2-3V LEFT COMPARISON:  08/14/2016 . FINDINGS: Angulated fracture noted of the left femoral neck. L4-L5 posterior and interbody fusion. Diffuse osteopenia degenerative change. Aortoiliac atherosclerotic vascular calcification. IMPRESSION: 1. Angulated fracture noted of the  left femoral neck. 2. Diffuse osteopenia degenerative change. L4-L5 posterior interbody fusion. Electronically Signed   By: Maisie Fus  Register   On: 10/15/2016 13:10     Scheduled Meds: . [MAR Hold] amLODipine  5 mg Oral Daily  . [MAR Hold] carbidopa-levodopa  1 tablet Oral TID AC  . [MAR Hold] docusate sodium  100 mg Oral BID  . [MAR Hold] levothyroxine  50 mcg Oral QAC breakfast  . [MAR Hold] lubiprostone  24 mcg Oral Q breakfast  . [MAR Hold] simvastatin  20 mg Oral QHS  . [MAR Hold] vitamin B-12  100 mcg Oral Daily   Continuous Infusions: . [MAR Hold] sodium chloride Stopped (10/16/16 0845)  .  ceFAZolin (ANCEF) IV    . [MAR Hold] methocarbamol (ROBAXIN)  IV      Principal Problem:   Femur fracture, left (HCC) Active Problems:   Depression   Parkinson's disease (HCC)   Hypertension   Anxiety   Hyperlipidemia   Hypothyroidism   Fall at home, sequela   Compression fracture of body of thoracic vertebra (HCC)   Nonspecific intraventricular block, old   Time spent:   Standley Dakins, MD, FAAFP [facility] Pager [phone]  If 7PM-7AM, please contact night-coverage [email or web address] Password [email or web address] 10/16/2016, 12:22 PM    LOS: 1 day

## 2016-10-16 NOTE — Op Note (Signed)
10/16/2016  3:22 PM  PATIENT:  Julie Farrell  71 y.o. female  PRE-OPERATIVE DIAGNOSIS:  left hip femoral neck fracture  POST-OPERATIVE DIAGNOSIS:  left hip femoral neck fracture  PROCEDURE:  Procedure(s): ARTHROPLASTY BIPOLAR HIP (HEMIARTHROPLASTY) (Left) - 40981  108F// 45 HEAD // 5NECK  DIRECT LATERAL APPROACH   DETAILS OF PROCEDURE tHE SURGICAL SITE WAS CONFIRMED AND MARKED IN THE PREOP AREA AND A CURSORY REEXAMINATION WAS PERFORMED. ID. THE PATIENT SAFE FOR SURGERY AND PROCEEDED WITHtaking her to the operating room where she had general anesthesia. We then placed in the lateral decubitus position with the left side up. After sterile prep and drape timeout was completed  I made a directdown to the fascia going through the subcutaneous tissue. I incised the fascia and reflected proximally and distally  The greater trochanter bursa was excised. The anterior half of the gluteus medius was split in line with its fibers and reflected from the greater trochanter along with the capsule.  The hip was dislocated.  I encountered fracture which was a valgus impacted fracture which had little if any good cortical bone.  I removed the head and it measured 45 mm  I then use a neck cutting guide to make a provisional neck cut. We used the starter reamer to open the canal followed by a canal finder box cutter serial broaching up to a size 3.  We then did a trial reduction with a 45 head and 1.5 neck length. Although that was reasonably stable I also tried a +5 neck length with 45 head and I was more satisfed with that construct in terms of the leg lengths.  The patient's wounds were irrigated copiously the trial prostheses were removed.  I made 2 drll holes in the greater trochanter and passed a #5 Ethibond suture which  This was done after the prosthesis was inserted and the hip was trialed with excellent results including leg length and stability testing  The wounds were irrigated the hip  was injected with 60 mL of Marcaine with epinephrine  The fascia was closed with the leg in abduction using #5ethibond suture suture and # 1 Bralon suture.  Subcutaneous tissue closed with 0 Monocryl and staples  The patient was extubated taken to recovery room in a regular bed with an abduction pillow in place stable condition  SURGEON:  Surgeon(s) and Role:    * Vickki Hearing, MD - Primary  PHYSICIAN ASSISTANT:   ASSISTANTS: BETTY ASHLEY    ANESTHESIA:   general  EBL:  Total I/O In: 1400 [I.V.:1400] Out: 650 [Urine:450; Blood:200]  BLOOD ADMINISTERED:none  DRAINS: none   LOCAL MEDICATIONS USED:  MARCAINE     SPECIMEN:  No Specimen  DISPOSITION OF SPECIMEN:  N/A  COUNTS:  YES  TOURNIQUET:  * No tourniquets in log *  DICTATION: .Dragon Dictation  PLAN OF CARE: Admit to inpatient   PATIENT DISPOSITION:  PACU - hemodynamically stable.   Delay start of Pharmacological VTE agent (>24hrs) due to surgical blood loss or risk of bleeding: NO   [id number]

## 2016-10-16 NOTE — Interval H&P Note (Signed)
History and Physical Interval Note:  10/16/2016 1:06 PM  Julie Farrell  has presented today for surgery, with the diagnosis of left hip femoral neck fracture  The various methods of treatment have been discussed with the patient and family. After consideration of risks, benefits and other options for treatment, the patient has consented to  Procedure(s): ARTHROPLASTY BIPOLAR HIP (HEMIARTHROPLASTY) (Left) as a surgical intervention .  The patient's history has been reviewed, patient examined, no change in status, stable for surgery.  I have reviewed the patient's chart and labs.  Questions were answered to the patient's satisfaction.     Fuller Canada

## 2016-10-16 NOTE — Consult Note (Addendum)
Reason for Consult: left hip fracture  Referring Physician: DR Rosana Berger is an 71 y.o. female.  HPI: 71 year old female fell and fractured her left hip from mechanical fall. She is status post lumbar fusion last October has not really walked well since requiring a walker. Daughter indicates that she does not walk well.  She complains of severe pain in the left groin and thigh which is sharp with movement dull with rest constant nonradiating present now for 24 hours  Past Medical History:  Diagnosis Date  . Abnormal pap   . Anxiety   . DDD (degenerative disc disease), lumbar   . Hemorrhoids   . HPV test positive 05/17/2013  . Hyperlipidemia   . Hypertension   . Hypothyroidism   . Parkinson's disease ([place])   . Thyroid disease   . Vaginal Pap smear, abnormal     Past Surgical History:  Procedure Laterality Date  . BACK SURGERY  10/2015   fusion L4-L 5, Dr Arnoldo Morale  . CATARACT EXTRACTION W/PHACO Left 05/16/2012   Procedure: CATARACT EXTRACTION PHACO AND INTRAOCULAR LENS PLACEMENT (IOC);  Surgeon: Tonny Branch, MD;  Location: [facility];  Service: Ophthalmology;  Laterality: Left;  CDE:15.20  . COLONOSCOPY  2009   [facility]-Dr. Laural Golden  . EYE SURGERY Bilateral 14,15   cataracts  . Fatty Tissue Excision Left 2008   [facility]-Dr. Luna Glasgow arm  . right ankle Right    fx    Family History  Problem Relation Age of Onset  . Heart attack Mother   . Heart disease Mother        CAD  . Heart disease Brother   . Heart disease Brother   . Hypertension Father   . Stroke Father     Social History:  reports that she has never smoked. She has never used smokeless tobacco. She reports that she does not drink alcohol or use drugs.  Allergies:  Allergies  Allergen Reactions  . Vicodin [Hydrocodone-Acetaminophen] Other (See Comments)    Upset stomach    Medications: I have reviewed the patient's current medications.  Results for orders placed or performed during the hospital encounter of  10/15/16 (from the past 48 hour(s))  Basic metabolic panel     Status: Abnormal   Collection Time: 10/15/16  2:25 PM  Result Value Ref Range   Sodium 137 135 - 145 mmol/L   Potassium 3.9 3.5 - 5.1 mmol/L   Chloride 104 101 - 111 mmol/L   CO2 25 22 - 32 mmol/L   Glucose, Bld 108 (H) 65 - 99 mg/dL   BUN 19 6 - 20 mg/dL   Creatinine, Ser 0.78 0.44 - 1.00 mg/dL   Calcium 10.1 8.9 - 10.3 mg/dL   GFR calc non Af Amer >60 >60 mL/min   GFR calc Af Amer >60 >60 mL/min    Comment: (NOTE) The eGFR has been calculated using the CKD EPI equation. This calculation has not been validated in all clinical situations. eGFR's persistently <60 mL/min signify possible Chronic Kidney Disease.    Anion gap 8 5 - 15  CBC WITH DIFFERENTIAL     Status: Abnormal   Collection Time: 10/15/16  2:25 PM  Result Value Ref Range   WBC 8.3 4.0 - 10.5 K/uL   RBC 4.27 3.87 - 5.11 MIL/uL   Hemoglobin 13.2 12.0 - 15.0 g/dL   HCT 39.1 36.0 - 46.0 %   MCV 91.6 78.0 - 100.0 fL   MCH 30.9 26.0 - 34.0 pg  MCHC 33.8 30.0 - 36.0 g/dL   RDW 12.7 11.5 - 15.5 %   Platelets 236 150 - 400 K/uL   Neutrophils Relative % 86 %   Neutro Abs 7.2 1.7 - 7.7 K/uL   Lymphocytes Relative 7 %   Lymphs Abs 0.6 (L) 0.7 - 4.0 K/uL   Monocytes Relative 6 %   Monocytes Absolute 0.5 0.1 - 1.0 K/uL   Eosinophils Relative 1 %   Eosinophils Absolute 0.0 0.0 - 0.7 K/uL   Basophils Relative 0 %   Basophils Absolute 0.0 0.0 - 0.1 K/uL  Protime-INR     Status: None   Collection Time: 10/15/16  2:25 PM  Result Value Ref Range   Prothrombin Time 13.2 11.4 - 15.2 seconds   INR 1.01   Type and screen [facility]     Status: None   Collection Time: 10/15/16  2:25 PM  Result Value Ref Range   ABO/RH(D) A POS    Antibody Screen NEG    Sample Expiration 10/18/2016   Surgical pcr screen     Status: None   Collection Time: 10/15/16 10:13 PM  Result Value Ref Range   MRSA, PCR NEGATIVE NEGATIVE   Staphylococcus aureus NEGATIVE NEGATIVE     Comment: (NOTE) The Xpert SA Assay (FDA approved for NASAL specimens in patients 71 years of age and older), is one component of a comprehensive surveillance program. It is not intended to diagnose infection nor to guide or monitor treatment.   CBC     Status: None   Collection Time: 10/16/16  6:38 AM  Result Value Ref Range   WBC 7.6 4.0 - 10.5 K/uL   RBC 4.03 3.87 - 5.11 MIL/uL   Hemoglobin 12.5 12.0 - 15.0 g/dL   HCT 37.0 36.0 - 46.0 %   MCV 91.8 78.0 - 100.0 fL   MCH 31.0 26.0 - 34.0 pg   MCHC 33.8 30.0 - 36.0 g/dL   RDW 12.6 11.5 - 15.5 %   Platelets 216 150 - 400 K/uL  Basic metabolic panel     Status: Abnormal   Collection Time: 10/16/16  6:38 AM  Result Value Ref Range   Sodium 139 135 - 145 mmol/L   Potassium 4.0 3.5 - 5.1 mmol/L   Chloride 104 101 - 111 mmol/L   CO2 27 22 - 32 mmol/L   Glucose, Bld 115 (H) 65 - 99 mg/dL   BUN 16 6 - 20 mg/dL   Creatinine, Ser 0.68 0.44 - 1.00 mg/dL   Calcium 9.8 8.9 - 10.3 mg/dL   GFR calc non Af Amer >60 >60 mL/min   GFR calc Af Amer >60 >60 mL/min    Comment: (NOTE) The eGFR has been calculated using the CKD EPI equation. This calculation has not been validated in all clinical situations. eGFR's persistently <60 mL/min signify possible Chronic Kidney Disease.    Anion gap 8 5 - 15    Dg Chest 1 View  Result Date: 10/15/2016 CLINICAL DATA:  Preop for hip surgery. EXAM: CHEST 1 VIEW COMPARISON:  Radiographs of August 05, 2016. FINDINGS: The heart size and mediastinal contours are within normal limits. Both lungs are clear. No pneumothorax or pleural effusion is noted. The visualized skeletal structures are unremarkable. IMPRESSION: No acute cardiopulmonary abnormality seen. Electronically Signed   By: Marijo Conception, M.D.   On: 10/15/2016 14:52   Dg Thoracic Spine 2 View  Result Date: 10/15/2016 CLINICAL DATA:  Thoracic spine pain after fall. EXAM:  THORACIC SPINE 2 VIEWS COMPARISON:  Radiographs of August 05, 2016. FINDINGS:  There is interval development of severe compression deformity T9 vertebral body concerning for acute to subacute fracture. No spondylolisthesis is noted. Disc spaces appear to be well maintained. IMPRESSION: Interval development of severe compression fracture involving T9 vertebral body concerning for acute to subacute fracture. Further evaluation with MRI is recommended. Electronically Signed   By: Marijo Conception, M.D.   On: 10/15/2016 14:55   Dg Elbow Complete Left  Result Date: 10/15/2016 CLINICAL DATA:  [place] today with left elbow pain EXAM: LEFT ELBOW - COMPLETE 3+ VIEW COMPARISON:  None. FINDINGS: The bones are diffusely osteopenic. Alignment is normal. Joint spaces appear normal. No fracture is seen. No joint effusion is noted. IMPRESSION: Osteopenia.  No acute fracture. Electronically Signed   By: Ivar Drape M.D.   On: 10/15/2016 13:10   Mr Thoracic Spine Wo Contrast  Result Date: 10/15/2016 CLINICAL DATA:  Fall with severe back pain. History of lumbar fusion. EXAM: MRI THORACIC SPINE WITHOUT CONTRAST TECHNIQUE: Multiplanar, multisequence MR imaging of the thoracic spine was performed. No intravenous contrast was administered. COMPARISON:  Thoracic spine radiograph 10/15/2016 FINDINGS: Alignment: There is exaggerated thoracic kyphosis centered at the T8-T9 level. No static subluxation. Vertebrae: There is an anterior compression fracture of the T9 vertebral body with mild edema. There is approximately 75% anterior height loss. No retropulsion. Signal abnormality in the anterior T8 vertebral body this is probably a hemangioma. Cord:  Normal signal and morphology. Paraspinal and other soft tissues: Negative. Disc levels: No spinal canal or neural foraminal stenosis of the thoracic spine. IMPRESSION: 1. Acute anterior compression fracture of T9 with approximately 75% height loss anteriorly. No retropulsion. 2. Increased thoracic kyphosis centered at the level of the fracture. 3. Trabeculated T1/T2  hyperintense focus in the anterior T8 vertebral body is suspected to be a hemangioma. Electronically Signed   By: Ulyses Jarred M.D.   On: 10/15/2016 17:51   Dg Hip Unilat With Pelvis 2-3 Views Left  Result Date: 10/15/2016 CLINICAL DATA:  Fall.  Hip pain. EXAM: DG HIP (WITH OR WITHOUT PELVIS) 2-3V LEFT COMPARISON:  08/14/2016 . FINDINGS: Angulated fracture noted of the left femoral neck. L4-L5 posterior and interbody fusion. Diffuse osteopenia degenerative change. Aortoiliac atherosclerotic vascular calcification. IMPRESSION: 1. Angulated fracture noted of the left femoral neck. 2. Diffuse osteopenia degenerative change. L4-L5 posterior interbody fusion. Electronically Signed   By: Marcello Moores  Register   On: 10/15/2016 13:10    Review of Systems  Constitutional: Negative for fever.  HENT: Negative for congestion.   Respiratory: Negative for shortness of breath.   Cardiovascular: Negative for chest pain.  Gastrointestinal: Negative for heartburn.  Genitourinary: Negative for dysuria.  Musculoskeletal: Positive for back pain.  Skin: Negative.   Neurological: Negative for dizziness, tremors and speech change.  Endo/Heme/Allergies: Negative for environmental allergies and polydipsia. Does not bruise/bleed easily.  Psychiatric/Behavioral: Negative for depression. The patient does not have insomnia.    Blood pressure (!) 145/61, pulse 87, temperature 98.8 F (37.1 C), temperature source Oral, resp. rate 18, height 5' 2"  (1.575 m), weight 95 lb 12.8 oz (43.5 kg), SpO2 96 %. Physical Exam  She is very small in stature and frame. She is oriented to person and place with slow speech. Her mood is flat her affect is flat She is nonambulatory because of fracture  The right and left upper extremity have no palpable tenderness and no deformity, there are no major joint contractures. There is  no instability or joint subluxation. Muscle tone and strength normal. Skin normal. Pulse and temperature normal no  edema. Lymph nodes at the epitrochlear region negative. Sensation normal. There are no pathologic reflexes such as the Hoffman test.  The right lower extremity is well aligned with normal range of motion stability and strength skin is intact without laceration neurovascular exam is intact groin lymph nodes are negative no pathologic reflexes are noted coordination is normal  Left lower extremity is in alignment. Tenderness over the greater trochanter and the proximal thigh left hip. Internal rotation causes pain. Stability tests were normal no subluxation of the knee muscle tone normal. Skin normal. No peripheral edema no swelling. Lymph nodes in the groin negative. Sensation normal. No pathologic reflexes. Balance normal.  Upper extremity deep tendon reflexes 2+ and equal at the elbow  X-ray shows a femoral neck fracture appears to be valgus impacted   Assessment/Plan: Left femoral neck fracture. I gave them 2 options 1 screw fixation with possibility of avascular necrosis or nonunion requiring subsequent surgery or prosthetic replacement. The option for prosthetic replacement  Bipolar replacement left hip   The procedure has been fully reviewed with the patient; The risks and benefits of surgery have been discussed and explained and understood. Alternative treatment has also been reviewed, questions were encouraged and answered. The postoperative plan is also been reviewed.   Addendum there is also a thoracic thoracic compression fracture which is stable and nonsurgical  Arther Abbott 10/16/2016, 8:34 AM

## 2016-10-16 NOTE — Progress Notes (Signed)
Initial Nutrition Assessment  DOCUMENTATION CODES:   Underweight  INTERVENTION:  Diet will be advanced once pt is cleared medically  RD will continue to follow and assess adequacy of intake   NUTRITION DIAGNOSIS:   Inadequate oral intake related to inability to eat as evidenced by NPO status.   GOAL:   Patient will meet greater than or equal to 90% of their needs   MONITOR:   Diet advancement, PO intake, Weight trends, Labs  REASON FOR ASSESSMENT:   Malnutrition Screening Tool    ASSESSMENT:   The patient is a 71 yo female who  presents to ED after falling. She has a left femur fx and is out of room currently for surgery. She has a hx of UTI and Parkinson's disease. Home diet is regular. Suspect she is chronically undernourished based on her wt loss trend the past year (17%) loss compared to last year and down 10% in the past 6 months.  RD will attempt to return and complete nutrition history and nutrition focused exam at a later time.   Recent Labs Lab 10/15/16 1425 10/16/16 0638  NA 137 139  K 3.9 4.0  CL 104 104  CO2 25 27  BUN 19 16  CREATININE 0.78 0.68  CALCIUM 10.1 9.8  GLUCOSE 108* 115*   labs and meds reviewed.  Diet Order:  Diet NPO time specified Diet NPO time specified Except for: Sips with Meds Diet NPO time specified  Skin:  Reviewed, no issues  Last BM:  9/27  Height:   Ht Readings from Last 1 Encounters:  10/16/16  (1.575 m)    Weight:   Wt Readings from Last 1 Encounters:  10/16/16 95 lb (43.1 kg)    Ideal Body Weight:  50 kg  BMI:  Body mass index is 17.38 kg/m.  Estimated Nutritional Needs:   Kcal:  1290-1505 (30-35 kcal/kg)  Protein:  55-60 gr  Fluid:  1.3 liters daily  EDUCATION NEEDS: none identified at this time  No education needs identified at this time  Royann Shivers MS,RD,CSG,LDN Office: #[phone] Pager: [phone]

## 2016-10-16 NOTE — Anesthesia Postprocedure Evaluation (Signed)
Anesthesia Post Note  Patient: Julie Farrell  Procedure(s) Performed: Procedure(s) (LRB): ARTHROPLASTY BIPOLAR HIP (HEMIARTHROPLASTY) (Left)  Patient location during evaluation: Nursing Unit Anesthesia Type: General Level of consciousness: awake and alert Pain management: satisfactory to patient Vital Signs Assessment: post-procedure vital signs reviewed and stable Respiratory status: spontaneous breathing Cardiovascular status: stable Postop Assessment: no apparent nausea or vomiting Anesthetic complications: no     Last Vitals:  Vitals:   10/16/16 1645 10/16/16 1700  BP: (!) 130/52 (!) 125/58  Pulse: 85 82  Resp: 20 20  Temp: 36.8 C 37.2 C  SpO2: 97% 96%    Last Pain:  Vitals:   10/16/16 1700  TempSrc: Oral  PainSc:                  Minerva Areola

## 2016-10-16 NOTE — Progress Notes (Signed)
**Note De-identified Julie Farrell Obfuscation** EKG completed

## 2016-10-16 NOTE — Interval H&P Note (Signed)
History and Physical Interval Note:  10/16/2016 1:09 PM  Julie Farrell  has presented today for surgery, with the diagnosis of left hip femoral neck fracture  The various methods of treatment have been discussed with the patient and family. After consideration of risks, benefits and other options for treatment, the patient has consented to  Procedure(s): ARTHROPLASTY BIPOLAR HIP (HEMIARTHROPLASTY) (Left) as a surgical intervention .  The patient's history has been reviewed, patient examined, no change in status, stable for surgery.  I have reviewed the patient's chart and labs.  Questions were answered to the patient's satisfaction.     Fuller Canada

## 2016-10-16 NOTE — NC FL2 (Signed)
[place] MEDICAID FL2 LEVEL OF CARE SCREENING TOOL     IDENTIFICATION  Patient Name: Julie Farrell Birthdate: [birth year] Sex: female Admission Date (Current Location): 10/15/2016  [facility] and [place] Number:  [employer] and Address:  [facility],  [address], [place] [address]      Provider Number: [id number]  Attending Physician Name and Address:  Cleora Fleet, MD  Relative Name and Phone Number:  Rosey Bath, daughter, [phone]    Current Level of Care: Hospital Recommended Level of Care: Skilled Nursing Facility Prior Approval Number:    Date Approved/Denied:   PASRR Number: [id number]  Discharge Plan: SNF    Current Diagnoses: Patient Active Problem List   Diagnosis Date Noted  . Femur fracture, left (HCC) 10/15/2016  . Fall at home, sequela 10/15/2016  . Compression fracture of body of thoracic vertebra (HCC) 10/15/2016  . Nonspecific intraventricular block, old 10/15/2016  . Parkinson's disease (HCC)   . Hypertension   . Anxiety   . Hyperlipidemia   . Hypothyroidism   . Closed hip fracture requiring operative repair, left, initial encounter (HCC)   . Pap smear for cervical cancer screening 08/03/2016  . Constipation 08/03/2016  . Vaginal dryness 08/03/2016  . History of HPV infection 08/03/2016  . Depression 08/03/2016  . Spondylolisthesis of lumbar region 10/31/2015  . HPV test positive 05/17/2013  . Cataract 05/11/2012    Orientation RESPIRATION BLADDER Height & Weight     Self, Time, Situation, Place  Normal Continent, Indwelling catheter Weight: 43.1 kg (95 lb) Height:   (157.5 cm)  BEHAVIORAL SYMPTOMS/MOOD NEUROLOGICAL BOWEL NUTRITION STATUS      Continent Diet (Please see DC Summary)  AMBULATORY STATUS COMMUNICATION OF NEEDS Skin   Limited Assist Verbally Surgical wounds (Closed incision on leg)                       Personal Care Assistance Level of Assistance  Bathing, Feeding, Dressing  Bathing Assistance: Limited assistance Feeding assistance: Independent Dressing Assistance: Limited assistance     Functional Limitations Info             SPECIAL CARE FACTORS FREQUENCY  PT (By licensed PT)     PT Frequency: not yet assessed              Contractures      Additional Factors Info  Code Status, Allergies Code Status Info: Full Allergies Info: Vicodin Hydrocodone-acetaminophen           Current Medications (10/16/2016):  This is the current hospital active medication list Current Facility-Administered Medications  Medication Dose Route Frequency Provider Last Rate Last Dose  . 0.9 %  sodium chloride infusion   Intravenous Continuous Vickki Hearing, MD 75 mL/hr at 10/16/16 1617    . amLODipine (NORVASC) tablet 5 mg  5 mg Oral Daily Haydee Monica, MD   Stopped at 10/16/16 1000  . [START ON 10/17/2016] aspirin EC tablet 325 mg  325 mg Oral Q breakfast Vickki Hearing, MD      . bisacodyl (DULCOLAX) suppository 10 mg  10 mg Rectal Daily PRN Haydee Monica, MD      . carbidopa-levodopa (SINEMET IR) 25-100 MG per tablet immediate release 1 tablet  1 tablet Oral TID AC Haydee Monica, MD   Stopped at 10/16/16 0800  . docusate sodium (COLACE) capsule 100 mg  100 mg Oral BID Haydee Monica, MD   Stopped at 10/16/16  1000  . levothyroxine (SYNTHROID, LEVOTHROID) tablet 50 mcg  50 mcg Oral QAC breakfast Haydee Monica, MD   Stopped at 10/16/16 0800  . lubiprostone (AMITIZA) capsule 24 mcg  24 mcg Oral Q breakfast Haydee Monica, MD   Stopped at 10/16/16 0800  . menthol-cetylpyridinium (CEPACOL) lozenge 3 mg  1 lozenge Oral PRN Vickki Hearing, MD       Or  . phenol (CHLORASEPTIC) mouth spray 1 spray  1 spray Mouth/Throat PRN Vickki Hearing, MD      . methocarbamol (ROBAXIN) tablet 500 mg  500 mg Oral Q6H PRN Haydee Monica, MD   500 mg at 10/15/16 2159   Or  . methocarbamol (ROBAXIN) 500 mg in dextrose 5 % 50 mL IVPB  500 mg Intravenous Q6H  PRN Haydee Monica, MD      . metoCLOPramide (REGLAN) tablet 5-10 mg  5-10 mg Oral Q8H PRN Vickki Hearing, MD       Or  . metoCLOPramide (REGLAN) injection 5-10 mg  5-10 mg Intravenous Q8H PRN Vickki Hearing, MD      . morphine 2 MG/ML injection 0.5 mg  0.5 mg Intravenous Q2H PRN Haydee Monica, MD   0.5 mg at 10/16/16 1621  . ondansetron (ZOFRAN) tablet 4 mg  4 mg Oral Q6H PRN Vickki Hearing, MD       Or  . ondansetron [facility]) injection 4 mg  4 mg Intravenous Q6H PRN Vickki Hearing, MD      . oxyCODONE (Oxy IR/ROXICODONE) immediate release tablet 5-10 mg  5-10 mg Oral Q4H PRN Haydee Monica, MD      . simvastatin (ZOCOR) tablet 20 mg  20 mg Oral QHS Tarry Kos A, MD   20 mg at 10/15/16 2159  . vitamin B-12 (CYANOCOBALAMIN) tablet 100 mcg  100 mcg Oral Daily Haydee Monica, MD   Stopped at 10/16/16 1000     Discharge Medications: Please see discharge summary for a list of discharge medications.  Relevant Imaging Results:  Relevant Lab Results:   Additional Information SSN: [id number] [place], [place]

## 2016-10-16 NOTE — Anesthesia Preprocedure Evaluation (Addendum)
Anesthesia Evaluation  Patient identified by MRN, date of birth, ID band Patient awake    Reviewed: Allergy & Precautions, NPO status , Patient's Chart, lab work & pertinent test results  Airway Mallampati: IV  TM Distance: >3 FB Neck ROM: Limited  Mouth opening: Limited Mouth Opening Comment: Very limited extension and side to side ROM. Dental  (+) Chipped, Teeth Intact,    Pulmonary neg pulmonary ROS,    Pulmonary exam normal breath sounds clear to auscultation       Cardiovascular hypertension, Pt. on medications  Rhythm:Regular Rate:Normal     Neuro/Psych PSYCHIATRIC DISORDERS Anxiety Depression Parkinson's Dx    GI/Hepatic negative GI ROS, Neg liver ROS,   Endo/Other  Hypothyroidism Hyperlipidemia  Renal/GU negative Renal ROS  negative genitourinary   Musculoskeletal  (+) Arthritis , Spondylolisthesis - fusion of lumbar spine 2017 T-spine compression Fx's - acute at T9.   Abdominal   Peds  Hematology negative hematology ROS (+)   Anesthesia Other Findings   Reproductive/Obstetrics HPV                            Anesthesia Physical Anesthesia Plan  ASA: III  Anesthesia Plan: General   Post-op Pain Management:    Induction: Intravenous  PONV Risk Score and Plan:   Airway Management Planned: Oral ETT and Video Laryngoscope Planned  Additional Equipment:   Intra-op Plan:   Post-operative Plan: Extubation in OR  Informed Consent: I have reviewed the patients History and Physical, chart, labs and discussed the procedure including the risks, benefits and alternatives for the proposed anesthesia with the patient or authorized representative who has indicated his/her understanding and acceptance.     Plan Discussed with: CRNA  Anesthesia Plan Comments:        Anesthesia Quick Evaluation

## 2016-10-16 NOTE — Brief Op Note (Signed)
10/15/2016 - 10/16/2016  3:22 PM  PATIENT:  Julie Farrell  71 y.o. female  PRE-OPERATIVE DIAGNOSIS:  left hip femoral neck fracture  POST-OPERATIVE DIAGNOSIS:  left hip femoral neck fracture  PROCEDURE:  Procedure(s): ARTHROPLASTY BIPOLAR HIP (HEMIARTHROPLASTY) (Left)   70F// 45 HEAD // 5NECK  DIRECT LATERAL APPROACH   SURGEON:  Surgeon(s) and Role:    * Vickki Hearing, MD - Primary  PHYSICIAN ASSISTANT:   ASSISTANTS: BETTY ASHLEY    ANESTHESIA:   general  EBL:  Total I/O In: 1400 [I.V.:1400] Out: 650 [Urine:450; Blood:200]  BLOOD ADMINISTERED:none  DRAINS: none   LOCAL MEDICATIONS USED:  MARCAINE     SPECIMEN:  No Specimen  DISPOSITION OF SPECIMEN:  N/A  COUNTS:  YES  TOURNIQUET:  * No tourniquets in log *  DICTATION: .Dragon Dictation  PLAN OF CARE: Admit to inpatient   PATIENT DISPOSITION:  PACU - hemodynamically stable.   Delay start of Pharmacological VTE agent (>24hrs) due to surgical blood loss or risk of bleeding: NO   [id number]

## 2016-10-16 NOTE — Transfer of Care (Signed)
Immediate Anesthesia Transfer of Care Note  Patient: Julie Farrell  Procedure(s) Performed: Procedure(s): ARTHROPLASTY BIPOLAR HIP (HEMIARTHROPLASTY) (Left)  Patient Location: PACU  Anesthesia Type:General  Level of Consciousness: awake and patient cooperative  Airway & Oxygen Therapy: Patient Spontanous Breathing  Post-op Assessment: Report given to RN and Post -op Vital signs reviewed and stable  Post vital signs: Reviewed and stable  Last Vitals:  Vitals:   10/16/16 1310 10/16/16 1315  BP: (!) 147/73 (!) 147/75  Pulse:    Resp: 16 14  Temp:    SpO2: 99% 99%    Last Pain:  Vitals:   10/16/16 1157  TempSrc: Oral  PainSc: 5       Patients Stated Pain Goal: 8 (10/16/16 1157)  Complications: No apparent anesthesia complications

## 2016-10-16 NOTE — Care Management Important Message (Signed)
Important Message  Patient Details  Name: JUSTICE AGUIRRE MRN: [id number] Date of Birth: 15-Feb-1945   Medicare Important Message Given:  Yes    Malcolm Metro, RN 10/16/2016, 9:18 AM

## 2016-10-16 NOTE — H&P (View-Only) (Signed)
Reason for Consult: left hip fracture  Referring Physician: DR Rosana Berger is an 71 y.o. female.  HPI: 70 year old female fell and fractured her left hip from mechanical fall. She is status post lumbar fusion last October has not really walked well since requiring a walker. Daughter indicates that she does not walk well.  She complains of severe pain in the left groin and thigh which is sharp with movement dull with rest constant nonradiating present now for 24 hours  Past Medical History:  Diagnosis Date  . Abnormal pap   . Anxiety   . DDD (degenerative disc disease), lumbar   . Hemorrhoids   . HPV test positive 05/17/2013  . Hyperlipidemia   . Hypertension   . Hypothyroidism   . Parkinson's disease ([place])   . Thyroid disease   . Vaginal Pap smear, abnormal     Past Surgical History:  Procedure Laterality Date  . BACK SURGERY  10/2015   fusion L4-L 5, Dr Arnoldo Morale  . CATARACT EXTRACTION W/PHACO Left 05/16/2012   Procedure: CATARACT EXTRACTION PHACO AND INTRAOCULAR LENS PLACEMENT (IOC);  Surgeon: Tonny Branch, MD;  Location: [facility];  Service: Ophthalmology;  Laterality: Left;  CDE:15.20  . COLONOSCOPY  2009   [facility]-Dr. Laural Golden  . EYE SURGERY Bilateral 14,15   cataracts  . Fatty Tissue Excision Left 2008   [facility]-Dr. Luna Glasgow arm  . right ankle Right    fx    Family History  Problem Relation Age of Onset  . Heart attack Mother   . Heart disease Mother        CAD  . Heart disease Brother   . Heart disease Brother   . Hypertension Father   . Stroke Father     Social History:  reports that she has never smoked. She has never used smokeless tobacco. She reports that she does not drink alcohol or use drugs.  Allergies:  Allergies  Allergen Reactions  . Vicodin [Hydrocodone-Acetaminophen] Other (See Comments)    Upset stomach    Medications: I have reviewed the patient's current medications.  Results for orders placed or performed during the hospital encounter of  10/15/16 (from the past 48 hour(s))  Basic metabolic panel     Status: Abnormal   Collection Time: 10/15/16  2:25 PM  Result Value Ref Range   Sodium 137 135 - 145 mmol/L   Potassium 3.9 3.5 - 5.1 mmol/L   Chloride 104 101 - 111 mmol/L   CO2 25 22 - 32 mmol/L   Glucose, Bld 108 (H) 65 - 99 mg/dL   BUN 19 6 - 20 mg/dL   Creatinine, Ser 0.78 0.44 - 1.00 mg/dL   Calcium 10.1 8.9 - 10.3 mg/dL   GFR calc non Af Amer >60 >60 mL/min   GFR calc Af Amer >60 >60 mL/min    Comment: (NOTE) The eGFR has been calculated using the CKD EPI equation. This calculation has not been validated in all clinical situations. eGFR's persistently <60 mL/min signify possible Chronic Kidney Disease.    Anion gap 8 5 - 15  CBC WITH DIFFERENTIAL     Status: Abnormal   Collection Time: 10/15/16  2:25 PM  Result Value Ref Range   WBC 8.3 4.0 - 10.5 K/uL   RBC 4.27 3.87 - 5.11 MIL/uL   Hemoglobin 13.2 12.0 - 15.0 g/dL   HCT 39.1 36.0 - 46.0 %   MCV 91.6 78.0 - 100.0 fL   MCH 30.9 26.0 - 34.0 pg  MCHC 33.8 30.0 - 36.0 g/dL   RDW 12.7 11.5 - 15.5 %   Platelets 236 150 - 400 K/uL   Neutrophils Relative % 86 %   Neutro Abs 7.2 1.7 - 7.7 K/uL   Lymphocytes Relative 7 %   Lymphs Abs 0.6 (L) 0.7 - 4.0 K/uL   Monocytes Relative 6 %   Monocytes Absolute 0.5 0.1 - 1.0 K/uL   Eosinophils Relative 1 %   Eosinophils Absolute 0.0 0.0 - 0.7 K/uL   Basophils Relative 0 %   Basophils Absolute 0.0 0.0 - 0.1 K/uL  Protime-INR     Status: None   Collection Time: 10/15/16  2:25 PM  Result Value Ref Range   Prothrombin Time 13.2 11.4 - 15.2 seconds   INR 1.01   Type and screen [facility]     Status: None   Collection Time: 10/15/16  2:25 PM  Result Value Ref Range   ABO/RH(D) A POS    Antibody Screen NEG    Sample Expiration 10/18/2016   Surgical pcr screen     Status: None   Collection Time: 10/15/16 10:13 PM  Result Value Ref Range   MRSA, PCR NEGATIVE NEGATIVE   Staphylococcus aureus NEGATIVE NEGATIVE     Comment: (NOTE) The Xpert SA Assay (FDA approved for NASAL specimens in patients 78 years of age and older), is one component of a comprehensive surveillance program. It is not intended to diagnose infection nor to guide or monitor treatment.   CBC     Status: None   Collection Time: 10/16/16  6:38 AM  Result Value Ref Range   WBC 7.6 4.0 - 10.5 K/uL   RBC 4.03 3.87 - 5.11 MIL/uL   Hemoglobin 12.5 12.0 - 15.0 g/dL   HCT 37.0 36.0 - 46.0 %   MCV 91.8 78.0 - 100.0 fL   MCH 31.0 26.0 - 34.0 pg   MCHC 33.8 30.0 - 36.0 g/dL   RDW 12.6 11.5 - 15.5 %   Platelets 216 150 - 400 K/uL  Basic metabolic panel     Status: Abnormal   Collection Time: 10/16/16  6:38 AM  Result Value Ref Range   Sodium 139 135 - 145 mmol/L   Potassium 4.0 3.5 - 5.1 mmol/L   Chloride 104 101 - 111 mmol/L   CO2 27 22 - 32 mmol/L   Glucose, Bld 115 (H) 65 - 99 mg/dL   BUN 16 6 - 20 mg/dL   Creatinine, Ser 0.68 0.44 - 1.00 mg/dL   Calcium 9.8 8.9 - 10.3 mg/dL   GFR calc non Af Amer >60 >60 mL/min   GFR calc Af Amer >60 >60 mL/min    Comment: (NOTE) The eGFR has been calculated using the CKD EPI equation. This calculation has not been validated in all clinical situations. eGFR's persistently <60 mL/min signify possible Chronic Kidney Disease.    Anion gap 8 5 - 15    Dg Chest 1 View  Result Date: 10/15/2016 CLINICAL DATA:  Preop for hip surgery. EXAM: CHEST 1 VIEW COMPARISON:  Radiographs of August 05, 2016. FINDINGS: The heart size and mediastinal contours are within normal limits. Both lungs are clear. No pneumothorax or pleural effusion is noted. The visualized skeletal structures are unremarkable. IMPRESSION: No acute cardiopulmonary abnormality seen. Electronically Signed   By: Marijo Conception, M.D.   On: 10/15/2016 14:52   Dg Thoracic Spine 2 View  Result Date: 10/15/2016 CLINICAL DATA:  Thoracic spine pain after fall. EXAM:  THORACIC SPINE 2 VIEWS COMPARISON:  Radiographs of August 05, 2016. FINDINGS:  There is interval development of severe compression deformity T9 vertebral body concerning for acute to subacute fracture. No spondylolisthesis is noted. Disc spaces appear to be well maintained. IMPRESSION: Interval development of severe compression fracture involving T9 vertebral body concerning for acute to subacute fracture. Further evaluation with MRI is recommended. Electronically Signed   By: Marijo Conception, M.D.   On: 10/15/2016 14:55   Dg Elbow Complete Left  Result Date: 10/15/2016 CLINICAL DATA:  [place] today with left elbow pain EXAM: LEFT ELBOW - COMPLETE 3+ VIEW COMPARISON:  None. FINDINGS: The bones are diffusely osteopenic. Alignment is normal. Joint spaces appear normal. No fracture is seen. No joint effusion is noted. IMPRESSION: Osteopenia.  No acute fracture. Electronically Signed   By: Ivar Drape M.D.   On: 10/15/2016 13:10   Mr Thoracic Spine Wo Contrast  Result Date: 10/15/2016 CLINICAL DATA:  Fall with severe back pain. History of lumbar fusion. EXAM: MRI THORACIC SPINE WITHOUT CONTRAST TECHNIQUE: Multiplanar, multisequence MR imaging of the thoracic spine was performed. No intravenous contrast was administered. COMPARISON:  Thoracic spine radiograph 10/15/2016 FINDINGS: Alignment: There is exaggerated thoracic kyphosis centered at the T8-T9 level. No static subluxation. Vertebrae: There is an anterior compression fracture of the T9 vertebral body with mild edema. There is approximately 75% anterior height loss. No retropulsion. Signal abnormality in the anterior T8 vertebral body this is probably a hemangioma. Cord:  Normal signal and morphology. Paraspinal and other soft tissues: Negative. Disc levels: No spinal canal or neural foraminal stenosis of the thoracic spine. IMPRESSION: 1. Acute anterior compression fracture of T9 with approximately 75% height loss anteriorly. No retropulsion. 2. Increased thoracic kyphosis centered at the level of the fracture. 3. Trabeculated T1/T2  hyperintense focus in the anterior T8 vertebral body is suspected to be a hemangioma. Electronically Signed   By: Ulyses Jarred M.D.   On: 10/15/2016 17:51   Dg Hip Unilat With Pelvis 2-3 Views Left  Result Date: 10/15/2016 CLINICAL DATA:  Fall.  Hip pain. EXAM: DG HIP (WITH OR WITHOUT PELVIS) 2-3V LEFT COMPARISON:  08/14/2016 . FINDINGS: Angulated fracture noted of the left femoral neck. L4-L5 posterior and interbody fusion. Diffuse osteopenia degenerative change. Aortoiliac atherosclerotic vascular calcification. IMPRESSION: 1. Angulated fracture noted of the left femoral neck. 2. Diffuse osteopenia degenerative change. L4-L5 posterior interbody fusion. Electronically Signed   By: Marcello Moores  Register   On: 10/15/2016 13:10    Review of Systems  Constitutional: Negative for fever.  HENT: Negative for congestion.   Respiratory: Negative for shortness of breath.   Cardiovascular: Negative for chest pain.  Gastrointestinal: Negative for heartburn.  Genitourinary: Negative for dysuria.  Musculoskeletal: Positive for back pain.  Skin: Negative.   Neurological: Negative for dizziness, tremors and speech change.  Endo/Heme/Allergies: Negative for environmental allergies and polydipsia. Does not bruise/bleed easily.  Psychiatric/Behavioral: Negative for depression. The patient does not have insomnia.    Blood pressure (!) 145/61, pulse 87, temperature 98.8 F (37.1 C), temperature source Oral, resp. rate 18, height 5' 2"  (1.575 m), weight 95 lb 12.8 oz (43.5 kg), SpO2 96 %. Physical Exam  She is very small in stature and frame. She is oriented to person and place with slow speech. Her mood is flat her affect is flat She is nonambulatory because of fracture  The right and left upper extremity have no palpable tenderness and no deformity, there are no major joint contractures. There is  no instability or joint subluxation. Muscle tone and strength normal. Skin normal. Pulse and temperature normal no  edema. Lymph nodes at the epitrochlear region negative. Sensation normal. There are no pathologic reflexes such as the Hoffman test.  The right lower extremity is well aligned with normal range of motion stability and strength skin is intact without laceration neurovascular exam is intact groin lymph nodes are negative no pathologic reflexes are noted coordination is normal  Left lower extremity is in alignment. Tenderness over the greater trochanter and the proximal thigh left hip. Internal rotation causes pain. Stability tests were normal no subluxation of the knee muscle tone normal. Skin normal. No peripheral edema no swelling. Lymph nodes in the groin negative. Sensation normal. No pathologic reflexes. Balance normal.  Upper extremity deep tendon reflexes 2+ and equal at the elbow  X-ray shows a femoral neck fracture appears to be valgus impacted   Assessment/Plan: Left femoral neck fracture. I gave them 2 options 1 screw fixation with possibility of avascular necrosis or nonunion requiring subsequent surgery or prosthetic replacement. The option for prosthetic replacement  Bipolar replacement left hip   The procedure has been fully reviewed with the patient; The risks and benefits of surgery have been discussed and explained and understood. Alternative treatment has also been reviewed, questions were encouraged and answered. The postoperative plan is also been reviewed.   Addendum there is also a thoracic thoracic compression fracture which is stable and nonsurgical  Arther Abbott 10/16/2016, 8:34 AM

## 2016-10-16 NOTE — Anesthesia Procedure Notes (Signed)
Procedure Name: Intubation Date/Time: 10/16/2016 1:39 PM Performed by: Franco Nones Pre-anesthesia Checklist: Patient identified, Emergency Drugs available, Suction available, Patient being monitored and Timeout performed Patient Re-evaluated:Patient Re-evaluated prior to induction Oxygen Delivery Method: Circle system utilized Preoxygenation: Pre-oxygenation with 100% oxygen Induction Type: IV induction Ventilation: Mask ventilation without difficulty Laryngoscope Size: Glidescope and 3 Grade View: Grade I Tube type: Oral Tube size: 7.0 mm Number of attempts: 1 Airway Equipment and Method: Video-laryngoscopy,  Stylet and Oral airway Placement Confirmation: ETT inserted through vocal cords under direct vision,  positive ETCO2 and breath sounds checked- equal and bilateral Secured at: 22 cm Tube secured with: Tape Dental Injury: Teeth and Oropharynx as per pre-operative assessment

## 2016-10-16 NOTE — Progress Notes (Signed)
1645 Patient declined to use IS at this time stating "her throat was sore, maybe later."  Patient will be seen and instruction on the IS at a later time.

## 2016-10-17 DIAGNOSIS — F329 Major depressive disorder, single episode, unspecified: Secondary | ICD-10-CM

## 2016-10-17 LAB — CBC
HCT: 30.3 % — ABNORMAL LOW (ref 36.0–46.0)
Hemoglobin: 10.4 g/dL — ABNORMAL LOW (ref 12.0–15.0)
MCH: 31.4 pg (ref 26.0–34.0)
MCHC: 34.3 g/dL (ref 30.0–36.0)
MCV: 91.5 fL (ref 78.0–100.0)
PLATELETS: 194 10*3/uL (ref 150–400)
RBC: 3.31 MIL/uL — ABNORMAL LOW (ref 3.87–5.11)
RDW: 12.6 % (ref 11.5–15.5)
WBC: 7.1 10*3/uL (ref 4.0–10.5)

## 2016-10-17 LAB — BASIC METABOLIC PANEL
Anion gap: 7 (ref 5–15)
BUN: 15 mg/dL (ref 6–20)
CALCIUM: 9.1 mg/dL (ref 8.9–10.3)
CO2: 26 mmol/L (ref 22–32)
CREATININE: 0.74 mg/dL (ref 0.44–1.00)
Chloride: 104 mmol/L (ref 101–111)
GFR calc non Af Amer: >60 mL/min (ref >60)
Glucose, Bld: 95 mg/dL (ref 65–99)
Potassium: 4 mmol/L (ref 3.5–5.1)
SODIUM: 137 mmol/L (ref 135–145)

## 2016-10-17 LAB — VITAMIN D 25 HYDROXY (VIT D DEFICIENCY, FRACTURES): Vit D, 25-Hydroxy: 22.2 ng/mL — ABNORMAL LOW (ref 30.0–100.0)

## 2016-10-17 MED ORDER — VITAMIN D 1000 UNITS PO TABS
5000.0000 [IU] | ORAL_TABLET | Freq: Every day | ORAL | Status: DC
  Administered 2016-10-17 – 2016-10-19 (×3): 5000 [IU] via ORAL
  Filled 2016-10-17 (×3): qty 5

## 2016-10-17 MED ORDER — ALPRAZOLAM 0.25 MG PO TABS
0.2500 mg | ORAL_TABLET | Freq: Every day | ORAL | Status: DC
Start: 2016-10-17 — End: 2016-10-19
  Administered 2016-10-17 – 2016-10-18 (×2): 0.25 mg via ORAL
  Filled 2016-10-17 (×2): qty 1

## 2016-10-17 MED ORDER — ALPRAZOLAM 0.25 MG PO TABS: 0.2500 mg | ORAL_TABLET | Freq: Three times a day (TID) | ORAL | Status: DC | PRN

## 2016-10-17 MED ORDER — ESCITALOPRAM OXALATE 10 MG PO TABS
10.0000 mg | ORAL_TABLET | Freq: Every day | ORAL | Status: DC
  Administered 2016-10-17 – 2016-10-19 (×3): 10 mg via ORAL
  Filled 2016-10-17 (×3): qty 1

## 2016-10-17 MED ORDER — OXYCODONE-ACETAMINOPHEN 5-325 MG PO TABS
1.0000 | ORAL_TABLET | ORAL | Status: DC | PRN
  Filled 2016-10-17: qty 1

## 2016-10-17 NOTE — Progress Notes (Signed)
10/17/2016 2:58 PM  Pt not voiding, bladder scan 400, ordered in/out cath and catheterize prn if unable to void.  Umair Rosiles [employer]

## 2016-10-17 NOTE — Progress Notes (Signed)
Patient ID: Julie Farrell, female   DOB: 05/12/1945, 71 y.o.   MRN: [id number] POD1 LEFT BIPOLAR   CBC Latest Ref Rng & Units 10/17/2016 10/16/2016 10/15/2016  WBC 4.0 - 10.5 K/uL 7.1 7.6 8.3  Hemoglobin 12.0 - 15.0 g/dL 10.4(L) 12.5 13.2  Hematocrit 36.0 - 46.0 % 30.3(L) 37.0 39.1  Platelets 150 - 400 K/uL 194 216 236    BMP Latest Ref Rng & Units 10/17/2016 10/16/2016 10/15/2016  Glucose 65 - 99 mg/dL 95 086(V) 784(O)  BUN 6 - 20 mg/dL Creatinine 0.44 - 1.00 mg/dL 9.62 9.52 8.41  Sodium 135 - 145 mmol/L 137 139 137  Potassium 3.5 - 5.1 mmol/L 4.0 4.0 3.9  Chloride 101 - 111 mmol/L 104 104 104  CO2 22 - 32 mmol/L Calcium 8.9 - 10.3 mg/dL 9.1 9.8 32.4   BP 401/02 (BP Location: Right Arm)   Pulse 93   Temp 99.4 F (37.4 C) (Oral)   Resp 18   Ht  (1.575 m)   Wt 95 lb (43.1 kg)   SpO2 96%   BMI 17.38 kg/m   I SAW HER SITTING UP, MUCH MORE ALERT  LIMB ALIGNMENT LOOKS GREAT CLINICALLY  ADVANCE PT  DECREASE IV OPIOIDS

## 2016-10-17 NOTE — Evaluation (Signed)
Physical Therapy Evaluation Patient Details Name: Julie Farrell MRN: [id number] DOB: [birth year] Today's Date: 10/17/2016   History of Present Illness  Julie Farrell is a 71 y.o. female s/p ARTHROPLASTY BIPOLAR HIP (HEMIARTHROPLASTY) (Left) secondary left hip femoral neck fracture with medical history significant of HTN, parkinsons, comes in after falling today and hurting her left leg.  The pain was so severe she could not get up so therefore came to the ED.  Pt walks with a walker.  Lives with her daughter.  Had a back injury last week but not from a fall.  Has issues with her legs "giving out".  Otherwise in normal state of health.  Pt referred for admission for left femur fx.  Dr Romeo Apple called and will see pt in am..    Clinical Impression  Patient instructed in post op exercises with fair/good return demonstrated and written instructions provided. Patient limited for functional mobility due to LLE pain/weakness (see below).  Patient will benefit from continued physical therapy in hospital and recommended venue below to increase strength, balance, endurance for safe ADLs and gait.    Follow Up Recommendations SNF;Supervision/Assistance - 24 hour    Equipment Recommendations  None recommended by PT    Recommendations for Other Services       Precautions / Restrictions Precautions Precautions: Fall Precaution Comments: hip precautions Required Braces or Orthoses:  (abduction wedge) Restrictions LLE Weight Bearing: Weight bearing as tolerated      Mobility  Bed Mobility Overal bed mobility: Needs Assistance Bed Mobility: Supine to Sit;Sit to Supine     Supine to sit: Max assist Sit to supine: Mod assist;Max assist   General bed mobility comments: difficulty propping up on elbows to sit up  Transfers Overall transfer level: Needs assistance Equipment used: Rolling walker (2 wheeled) Transfers: Sit to/from [employer] Sit to Stand: Mod assist Stand pivot  transfers: Mod assist;Max assist       General transfer comment: requires assistance to move LLE when taking steps  Ambulation/Gait Ambulation/Gait assistance: Max assist Ambulation Distance (Feet): 5 Feet Assistive device: Rolling walker (2 wheeled) Gait Pattern/deviations: Decreased step length - right;Decreased step length - left;Decreased stance time - left;Decreased stride length   Gait velocity interpretation: Below normal speed for age/gender General Gait Details: limited to a few steps secondary to LLE weakness/pain  Stairs            Wheelchair Mobility    Modified Rankin (Stroke Patients Only)       Balance Overall balance assessment: Needs assistance Sitting-balance support: Feet supported;No upper extremity supported Sitting balance-Leahy Scale: Fair     Standing balance support: Bilateral upper extremity supported;During functional activity Standing balance-Leahy Scale: Poor                               Pertinent Vitals/Pain Pain Assessment: 0-10 Pain Score: 7  Pain Location: left hip Pain Descriptors / Indicators: Aching;Sharp Pain Intervention(s): Premedicated before session;Limited activity within patient's tolerance;Monitored during session    Home Living Family/patient expects to be discharged to:: Private residence Living Arrangements: Children Available Help at Discharge: Family Type of Home: House Home Access: Level entry     Home Layout: One level Home Equipment: Environmental consultant - 4 wheels;Cane - single point;Bedside commode;Shower seat      Prior Function Level of Independence: Independent with assistive device(s)               Hand  Dominance        Extremity/Trunk Assessment   Upper Extremity Assessment Upper Extremity Assessment: Generalized weakness    Lower Extremity Assessment Lower Extremity Assessment: Generalized weakness;LLE deficits/detail LLE Deficits / Details: grossly -3/5    Cervical / Trunk  Assessment Cervical / Trunk Assessment: Normal  Communication   Communication: No difficulties  Cognition Arousal/Alertness: Awake/alert Behavior During Therapy: WFL for tasks assessed/performed Overall Cognitive Status: Within Functional Limits for tasks assessed                                        General Comments      Exercises Total Joint Exercises Ankle Circles/Pumps: AROM;Both;10 reps;Supine Quad Sets: AROM;Both;10 reps;Supine Gluteal Sets: AROM;Both;10 reps;Supine Short Arc Quad: AAROM;Left;10 reps;Supine Heel Slides: AAROM;Left;10 reps;Supine   Assessment/Plan    PT Assessment Patient needs continued PT services  PT Problem List Decreased strength;Decreased activity tolerance;Decreased balance;Decreased mobility       PT Treatment Interventions Gait training;Functional mobility training;Therapeutic activities;Therapeutic exercise;Patient/family education    PT Goals (Current goals can be found in the Care Plan section)  Acute Rehab PT Goals Patient Stated Goal: return home after rehab PT Goal Formulation: With patient Time For Goal Achievement: 10/31/16 Potential to Achieve Goals: Good    Frequency 7X/week   Barriers to discharge        Co-evaluation               AM-PAC PT "6 Clicks" Daily Activity  Outcome Measure Difficulty turning over in bed (including adjusting bedclothes, sheets and blankets)?: Unable Difficulty moving from lying on back to sitting on the side of the bed? : Unable Difficulty sitting down on and standing up from a chair with arms (e.g., wheelchair, bedside commode, etc,.)?: Unable Help needed moving to and from a bed to chair (including a wheelchair)?: A Lot Help needed walking in hospital room?: A Lot Help needed climbing 3-5 steps with a railing? : Total 6 Click Score: 8    End of Session   Activity Tolerance: Patient limited by fatigue;Patient limited by pain Patient left: in chair;with call  bell/phone within reach Nurse Communication: Mobility status PT Visit Diagnosis: Unsteadiness on feet (R26.81);Other abnormalities of gait and mobility (R26.89);Muscle weakness (generalized) (M62.81)    Time: 1610-9604 PT Time Calculation (min) (ACUTE ONLY): 35 min   Charges:   PT Evaluation $PT Eval Low Complexity: 1 Low PT Treatments $Therapeutic Activity: 23-37 mins   PT G Codes:        11:10 AM, 2016/10/18 Ocie Bob, MPT Physical Therapist with [facility] 336 [phone] office [phone] mobile phone

## 2016-10-17 NOTE — Progress Notes (Signed)
PROGRESS NOTE   Julie Farrell  ZOX:[id number]  DOB: 08/08/1945  DOA: 10/15/2016 PCP: [facility]  Brief Admission Hx: Julie Farrell is a 71 y.o. female with medical history significant of HTN, parkinsons, comes in after falling today and hurting her left leg.  The pain was so severe she could not get up so therefore came to the ED.  Pt walks with a walker.  Lives with her daughter.  Had a back injury last week but not from a fall.  Has issues with her legs "giving out".  Otherwise in normal state of health.  Pt referred for admission for left femur fx.   MDM/Assessment & Plan:   1. Acute left femur fracture - Pt POD#1 s/p left hip repair.  Ambulate with PT, Pain management. SNF placement recommended. Consulted Child psychotherapist.  2. Parkinson's Disease - stable  3. Hypothyroidism - resume home levothyroxine.  4. Vit D deficiency - oral Vit D ordered.  5. Hyperlipidemia - stable, resume home meds.   DVT prophylaxis: SCDs Code Status: FULL  Family Communication: bedside Disposition Plan: SNF  Consultants:  orthopedics  Subjective: Pt without complaints.    Objective: Vitals:   10/16/16 1800 10/16/16 1900 10/16/16 2000 10/17/16 0000  BP: 130/68 (!) 114/59 117/71 120/61  Pulse: 82 (!) 107 81 93  Resp: Temp: 99.4 F (37.4 C) 99.1 F (37.3 C) 98.5 F (36.9 C) 99.4 F (37.4 C)  TempSrc: Oral Oral Oral Oral  SpO2: 97% 97% 96% 96%  Weight:      Height:        Intake/Output Summary (Last 24 hours) at 10/17/16 1134 Last data filed at 10/17/16 0600  Gross per 24 hour  Intake             2010 ml  Output             1350 ml  Net              660 ml   Filed Weights   10/15/16 2059 10/16/16 1157  Weight: 43.5 kg (95 lb 12.8 oz) 43.1 kg (95 lb)     REVIEW OF SYSTEMS  As per history otherwise all reviewed and reported negative  Exam:  General exam: awake, alert, NAD. Cooperative.  Respiratory system:  No increased work of  breathing. Cardiovascular system: S1 & S2 heard.  No JVD, murmurs, gallops, clicks or pedal edema. Gastrointestinal system: Abdomen is nondistended, soft and nontender. Normal bowel sounds heard. Central nervous system: Alert and oriented. No focal neurological deficits. Extremities: good perfusion noted. Wounds c/d/i, good alignment  Data Reviewed: Basic Metabolic Panel:  Recent Labs Lab 10/15/16 1425 10/16/16 0638 10/17/16 0555  NA 137 139 137  K 3.9 4.0 4.0  CL 104 104 104  CO2 GLUCOSE 108* 115* 95  BUN CREATININE 0.78 0.68 0.74  CALCIUM 10.1 9.8 9.1   Liver Function Tests: No results for input(s): AST, ALT, ALKPHOS, BILITOT, PROT, ALBUMIN in the last 168 hours. No results for input(s): LIPASE, AMYLASE in the last 168 hours. No results for input(s): AMMONIA in the last 168 hours. CBC:  Recent Labs Lab 10/15/16 1425 10/16/16 0638 10/17/16 0555  WBC 8.3 7.6 7.1  NEUTROABS 7.2  --   --   HGB 13.2 12.5 10.4*  HCT 39.1 37.0 30.3*  MCV 91.6 91.8 91.5  PLT 236 216 194   Cardiac Enzymes: No results for input(s):  CKTOTAL, CKMB, CKMBINDEX, TROPONINI in the last 168 hours. CBG (last 3)  No results for input(s): GLUCAP in the last 72 hours. Recent Results (from the past 240 hour(s))  Surgical pcr screen     Status: None   Collection Time: 10/15/16 10:13 PM  Result Value Ref Range Status   MRSA, PCR NEGATIVE NEGATIVE Final   Staphylococcus aureus NEGATIVE NEGATIVE Final    Comment: (NOTE) The Xpert SA Assay (FDA approved for NASAL specimens in patients 64 years of age and older), is one component of a comprehensive surveillance program. It is not intended to diagnose infection nor to guide or monitor treatment.      Studies: Dg Chest 1 View  Result Date: 10/15/2016 CLINICAL DATA:  Preop for hip surgery. EXAM: CHEST 1 VIEW COMPARISON:  Radiographs of August 05, 2016. FINDINGS: The heart size and mediastinal contours are within normal limits. Both  lungs are clear. No pneumothorax or pleural effusion is noted. The visualized skeletal structures are unremarkable. IMPRESSION: No acute cardiopulmonary abnormality seen. Electronically Signed   By: Lupita Raider, M.D.   On: 10/15/2016 14:52   Dg Thoracic Spine 2 View  Result Date: 10/15/2016 CLINICAL DATA:  Thoracic spine pain after fall. EXAM: THORACIC SPINE 2 VIEWS COMPARISON:  Radiographs of August 05, 2016. FINDINGS: There is interval development of severe compression deformity T9 vertebral body concerning for acute to subacute fracture. No spondylolisthesis is noted. Disc spaces appear to be well maintained. IMPRESSION: Interval development of severe compression fracture involving T9 vertebral body concerning for acute to subacute fracture. Further evaluation with MRI is recommended. Electronically Signed   By: Lupita Raider, M.D.   On: 10/15/2016 14:55   Dg Elbow Complete Left  Result Date: 10/15/2016 CLINICAL DATA:  Larey Seat today with left elbow pain EXAM: LEFT ELBOW - COMPLETE 3+ VIEW COMPARISON:  None. FINDINGS: The bones are diffusely osteopenic. Alignment is normal. Joint spaces appear normal. No fracture is seen. No joint effusion is noted. IMPRESSION: Osteopenia.  No acute fracture. Electronically Signed   By: Dwyane Dee M.D.   On: 10/15/2016 13:10   Mr Thoracic Spine Wo Contrast  Result Date: 10/15/2016 CLINICAL DATA:  Fall with severe back pain. History of lumbar fusion. EXAM: MRI THORACIC SPINE WITHOUT CONTRAST TECHNIQUE: Multiplanar, multisequence MR imaging of the thoracic spine was performed. No intravenous contrast was administered. COMPARISON:  Thoracic spine radiograph 10/15/2016 FINDINGS: Alignment: There is exaggerated thoracic kyphosis centered at the T8-T9 level. No static subluxation. Vertebrae: There is an anterior compression fracture of the T9 vertebral body with mild edema. There is approximately 75% anterior height loss. No retropulsion. Signal abnormality in the anterior  T8 vertebral body this is probably a hemangioma. Cord:  Normal signal and morphology. Paraspinal and other soft tissues: Negative. Disc levels: No spinal canal or neural foraminal stenosis of the thoracic spine. IMPRESSION: 1. Acute anterior compression fracture of T9 with approximately 75% height loss anteriorly. No retropulsion. 2. Increased thoracic kyphosis centered at the level of the fracture. 3. Trabeculated T1/T2 hyperintense focus in the anterior T8 vertebral body is suspected to be a hemangioma. Electronically Signed   By: Deatra Robinson M.D.   On: 10/15/2016 17:51   Dg Pelvis Portable  Result Date: 10/16/2016 CLINICAL DATA:  Left hip fracture, postop EXAM: PORTABLE PELVIS 1-2 VIEWS COMPARISON:  10/15/2016 FINDINGS: Partially visualized lower lumbar spine hardware. SI joints are symmetric bilaterally. Right femoral head projects in joint. Pubic symphysis is intact. Interval left hip replacement with normal  alignment. Soft tissue gas and cutaneous staples over the left hip. IMPRESSION: Interval left hip replacement with normal alignment. Soft tissue gas and cutaneous staples consistent with recent postsurgical change. Electronically Signed   By: Jasmine Pang M.D.   On: 10/16/2016 16:22   Dg Hip Unilat With Pelvis 2-3 Views Left  Result Date: 10/15/2016 CLINICAL DATA:  Fall.  Hip pain. EXAM: DG HIP (WITH OR WITHOUT PELVIS) 2-3V LEFT COMPARISON:  08/14/2016 . FINDINGS: Angulated fracture noted of the left femoral neck. L4-L5 posterior and interbody fusion. Diffuse osteopenia degenerative change. Aortoiliac atherosclerotic vascular calcification. IMPRESSION: 1. Angulated fracture noted of the left femoral neck. 2. Diffuse osteopenia degenerative change. L4-L5 posterior interbody fusion. Electronically Signed   By: Maisie Fus  Register   On: 10/15/2016 13:10     Scheduled Meds: . ALPRAZolam  0.25 mg Oral QHS  . amLODipine  5 mg Oral Daily  . aspirin EC  325 mg Oral Q breakfast  . carbidopa-levodopa   1 tablet Oral TID AC  . cholecalciferol  5,000 Units Oral Daily  . docusate sodium  100 mg Oral BID  . escitalopram  10 mg Oral Daily  . levothyroxine  50 mcg Oral QAC breakfast  . lubiprostone  24 mcg Oral Q breakfast  . simvastatin  20 mg Oral QHS  . vitamin B-12  100 mcg Oral Daily   Continuous Infusions: . sodium chloride 50 mL/hr at 10/17/16 0933  . methocarbamol (ROBAXIN)  IV      Principal Problem:   Femur fracture, left (HCC) Active Problems:   Depression   Parkinson's disease (HCC)   Hypertension   Anxiety   Hyperlipidemia   Hypothyroidism   Fall at home, sequela   Compression fracture of body of thoracic vertebra (HCC)   Nonspecific intraventricular block, old   Time spent:   Standley Dakins, MD, FAAFP [facility] Pager [phone]  If 7PM-7AM, please contact night-coverage [email or web address] Password [email or web address] 10/17/2016, 11:34 AM    LOS: 2 days

## 2016-10-18 DIAGNOSIS — R338 Other retention of urine: Secondary | ICD-10-CM

## 2016-10-18 LAB — CBC
HEMATOCRIT: 29.6 % — AB (ref 36.0–46.0)
Hemoglobin: 10.3 g/dL — ABNORMAL LOW (ref 12.0–15.0)
MCH: 31.6 pg (ref 26.0–34.0)
MCHC: 34.8 g/dL (ref 30.0–36.0)
MCV: 90.8 fL (ref 78.0–100.0)
PLATELETS: 213 10*3/uL (ref 150–400)
RBC: 3.26 MIL/uL — ABNORMAL LOW (ref 3.87–5.11)
RDW: 12.5 % (ref 11.5–15.5)
WBC: 7.3 10*3/uL (ref 4.0–10.5)

## 2016-10-18 MED ORDER — TAMSULOSIN HCL 0.4 MG PO CAPS
0.4000 mg | ORAL_CAPSULE | Freq: Every day | ORAL | Status: DC
  Administered 2016-10-18 – 2016-10-19 (×2): 0.4 mg via ORAL
  Filled 2016-10-18 (×2): qty 1

## 2016-10-18 NOTE — Progress Notes (Signed)
PROGRESS NOTE   Julie Farrell  ZOX:[id number]  DOB: 1945-11-16  DOA: 10/15/2016 PCP: [facility]  Brief Admission Hx: Julie Farrell is a 71 y.o. female with medical history significant of HTN, parkinsons, comes in after falling today and hurting her left leg.  The pain was so severe she could not get up so therefore came to the ED.  Pt walks with a walker.  Lives with her daughter.  Had a back injury last week but not from a fall.  Has issues with her legs "giving out".  Otherwise in normal state of health.  Pt referred for admission for left femur fx.   MDM/Assessment & Plan:   1. Acute left femur fracture - Pt POD#2 s/p left hip repair.  Ambulate with PT, Pain management. SNF placement recommended. Consulted Child psychotherapist.  Planning to discharge to SNF tomorrow.  2. Acute urinary retention - Pt has had urinary retention postop, ordered catheterize prn if unable to void, may need to place foley if does not void and discharge with foley. Started flomax.  3. Parkinson's Disease - stable  4. Hypothyroidism - resume home levothyroxine.  5. Vit D deficiency - oral Vit D ordered.  6. Hyperlipidemia - stable, resume home meds.   DVT prophylaxis: SCDs Code Status: FULL  Family Communication: bedside Disposition Plan: SNF  Consultants:  orthopedics  Subjective: Pt without complaints.  Sitting up in a chair today.   Objective: Vitals:   10/17/16 0000 10/17/16 1400 10/17/16 2120 10/18/16 0449  BP: 120/61 (!) 101/53 115/62 (!) 126/50  Pulse: 93 94 89 81  Resp: Temp: 99.4 F (37.4 C) 99.1 F (37.3 C) 98.7 F (37.1 C) 98.6 F (37 C)  TempSrc: Oral Oral Oral Oral  SpO2: 96% 95% 97% 99%  Weight:      Height:        Intake/Output Summary (Last 24 hours) at 10/18/16 1104 Last data filed at 10/18/16 0800  Gross per 24 hour  Intake           984.75 ml  Output              750 ml  Net           234.75 ml   Filed Weights   10/15/16 2059 10/16/16  1157  Weight: 43.5 kg (95 lb 12.8 oz) 43.1 kg (95 lb)     REVIEW OF SYSTEMS  As per history otherwise all reviewed and reported negative  Exam:  General exam: thin frail lady, sitting up in chair, awake, alert, NAD. Cooperative.  Respiratory system:  No increased work of breathing. Cardiovascular system: S1 & S2 heard.  No JVD, murmurs, gallops, clicks or pedal edema. Gastrointestinal system: Abdomen is nondistended, soft and nontender. Normal bowel sounds heard. Central nervous system: Alert and oriented. No focal neurological deficits. Extremities: good perfusion noted. Wounds c/d/i, good alignment  Data Reviewed: Basic Metabolic Panel:  Recent Labs Lab 10/15/16 1425 10/16/16 0638 10/17/16 0555  NA 137 139 137  K 3.9 4.0 4.0  CL 104 104 104  CO2 GLUCOSE 108* 115* 95  BUN CREATININE 0.78 0.68 0.74  CALCIUM 10.1 9.8 9.1   Liver Function Tests: No results for input(s): AST, ALT, ALKPHOS, BILITOT, PROT, ALBUMIN in the last 168 hours. No results for input(s): LIPASE, AMYLASE in the last 168 hours. No results for input(s): AMMONIA in the last 168 hours. CBC:  Recent  Labs Lab 10/15/16 1425 10/16/16 0638 10/17/16 0555 10/18/16 0611  WBC 8.3 7.6 7.1 7.3  NEUTROABS 7.2  --   --   --   HGB 13.2 12.5 10.4* 10.3*  HCT 39.1 37.0 30.3* 29.6*  MCV 91.6 91.8 91.5 90.8  PLT 236 216 194 213   Cardiac Enzymes: No results for input(s): CKTOTAL, CKMB, CKMBINDEX, TROPONINI in the last 168 hours. CBG (last 3)  No results for input(s): GLUCAP in the last 72 hours. Recent Results (from the past 240 hour(s))  Surgical pcr screen     Status: None   Collection Time: 10/15/16 10:13 PM  Result Value Ref Range Status   MRSA, PCR NEGATIVE NEGATIVE Final   Staphylococcus aureus NEGATIVE NEGATIVE Final    Comment: (NOTE) The Xpert SA Assay (FDA approved for NASAL specimens in patients 93 years of age and older), is one component of a comprehensive surveillance  program. It is not intended to diagnose infection nor to guide or monitor treatment.     Studies: Dg Pelvis Portable  Result Date: 10/16/2016 CLINICAL DATA:  Left hip fracture, postop EXAM: PORTABLE PELVIS 1-2 VIEWS COMPARISON:  10/15/2016 FINDINGS: Partially visualized lower lumbar spine hardware. SI joints are symmetric bilaterally. Right femoral head projects in joint. Pubic symphysis is intact. Interval left hip replacement with normal alignment. Soft tissue gas and cutaneous staples over the left hip. IMPRESSION: Interval left hip replacement with normal alignment. Soft tissue gas and cutaneous staples consistent with recent postsurgical change. Electronically Signed   By: Jasmine Pang M.D.   On: 10/16/2016 16:22   Scheduled Meds: . ALPRAZolam  0.25 mg Oral QHS  . amLODipine  5 mg Oral Daily  . aspirin EC  325 mg Oral Q breakfast  . carbidopa-levodopa  1 tablet Oral TID AC  . cholecalciferol  5,000 Units Oral Daily  . docusate sodium  100 mg Oral BID  . escitalopram  10 mg Oral Daily  . levothyroxine  50 mcg Oral QAC breakfast  . lubiprostone  24 mcg Oral Q breakfast  . simvastatin  20 mg Oral QHS  . tamsulosin  0.4 mg Oral Daily  . vitamin B-12  100 mcg Oral Daily   Continuous Infusions: . sodium chloride 10 mL/hr at 10/17/16 1133  . methocarbamol (ROBAXIN)  IV      Principal Problem:   Femur fracture, left (HCC) Active Problems:   Depression   Parkinson's disease (HCC)   Hypertension   Anxiety   Hyperlipidemia   Hypothyroidism   Fall at home, sequela   Compression fracture of body of thoracic vertebra (HCC)   Nonspecific intraventricular block, old   Acute urinary retention  Time spent:   Standley Dakins, MD, FAAFP [facility] Pager [phone]  If 7PM-7AM, please contact night-coverage [email or web address] Password [email or web address] 10/18/2016, 11:04 AM    LOS: 3 days

## 2016-10-18 NOTE — Progress Notes (Signed)
PT unable to void all of 7p-7a on own. In and out cath ordered PRN. In and out cath performed with success around 0430. Continue to push PO oral fluids. Continue to monitor.

## 2016-10-18 NOTE — Progress Notes (Signed)
Pt unable to void. Pt c/o urgency but difficulty beginning stream and retention. Pt unable to void when placed on BSC and allowed time to urinate. Pt abdomen became taut, distended and uncomfortable per pt. Bladder Scan completed reading 450 mL of urine in bladder. In and out catheter performed, 522m of urine drained. Upon insertion of I&O catheter, resistance was met. With repositioning Pt, catheter was inserted with no resistance and minimal pain. Pt tolerated well. PT states relief and more comfort. Continue to monitor. MD contacted about urine retention and lack of output without insertion of a catheter.

## 2016-10-18 NOTE — Progress Notes (Signed)
Patient ID: Julie Farrell, female   DOB: 07/21/1945, 71 y.o.   MRN: [id number] BP (!) 126/50 (BP Location: Left Arm)   Pulse 81   Temp 98.6 F (37 C) (Oral)   Resp 18   Ht  (1.575 m)   Wt 95 lb (43.1 kg)   SpO2 99%   BMI 17.38 kg/m   Wound looks good  CBC Latest Ref Rng & Units 10/18/2016 10/17/2016 10/16/2016  WBC 4.0 - 10.5 K/uL 7.3 7.1 7.6  Hemoglobin 12.0 - 15.0 g/dL 10.3(L) 10.4(L) 12.5  Hematocrit 36.0 - 46.0 % 29.6(L) 30.3(L) 37.0  Platelets 150 - 400 K/uL 213 194 216   BMP Latest Ref Rng & Units 10/17/2016 10/16/2016 10/15/2016  Glucose 65 - 99 mg/dL 95 409(W) 119(J)  BUN 6 - 20 mg/dL Creatinine 0.44 - 1.00 mg/dL 4.78 2.95 6.21  Sodium 135 - 145 mmol/L 137 139 137  Potassium 3.5 - 5.1 mmol/L 4.0 4.0 3.9  Chloride 101 - 111 mmol/L 104 104 104  CO2 22 - 32 mmol/L Calcium 8.9 - 10.3 mg/dL 9.1 9.8 30.8   MS alert   Continue PT discharge Monday to SNF

## 2016-10-18 NOTE — Progress Notes (Signed)
Physical Therapy Treatment Patient Details Name: Julie Farrell MRN: [id number] DOB: 1945-08-29 Today's Date: 10/18/2016    History of Present Illness Julie Farrell is a 71 y.o. female s/p ARTHROPLASTY BIPOLAR HIP (HEMIARTHROPLASTY) (Left) secondary left hip femoral neck fracture with medical history significant of HTN, parkinsons, comes in after falling today and hurting her left leg.  The pain was so severe she could not get up so therefore came to the ED.  Pt walks with a walker.  Lives with her daughter.  Had a back injury last week but not from a fall.  Has issues with her legs "giving out".  Otherwise in normal state of health.  Pt referred for admission for left femur fx.  Dr Romeo Apple called and will see pt in am..    PT Comments    Patient tolerated taking more steps at bedside, but noted to have bilateral loss of ankle dorsiflexion and unable to heel to toe step (base line per patient due to old back surgeries).  Patient tolerated sitting up in chair after therapy - nursing staff notified.  Patient will benefit from continued physical therapy in hospital and recommended venue below to increase strength, balance, endurance for safe ADLs and gait.   Follow Up Recommendations  SNF;Supervision/Assistance - 24 hour     Equipment Recommendations  None recommended by PT    Recommendations for Other Services       Precautions / Restrictions Precautions Precautions: Fall Restrictions LLE Weight Bearing: Weight bearing as tolerated    Mobility  Bed Mobility Overal bed mobility: Needs Assistance Bed Mobility: Supine to Sit;Sit to Supine     Supine to sit: Max assist Sit to supine: Mod assist;Max assist   General bed mobility comments: difficulty propping up on elbows to sit up  Transfers Overall transfer level: Needs assistance Equipment used: Rolling walker (2 wheeled) Transfers: Sit to/from [employer] Sit to Stand: Mod assist Stand pivot transfers: Mod  assist;Max assist       General transfer comment: very labored slow movement  Ambulation/Gait Ambulation/Gait assistance: Max assist Ambulation Distance (Feet): 8 Feet Assistive device: Rolling walker (2 wheeled) Gait Pattern/deviations: Decreased step length - right;Decreased step length - left;Decreased stance time - left;Decreased stride length   Gait velocity interpretation: Below normal speed for age/gender General Gait Details: Patient unable to heel to toe step due to lack of bilateral ankle dorsiflexion (-20 degrees left/right), tends to take shuffling steps with dragging of toes (base line per patient due to old back surgeries)   Stairs            Wheelchair Mobility    Modified Rankin (Stroke Patients Only)       Balance Overall balance assessment: Needs assistance Sitting-balance support: Feet supported;No upper extremity supported Sitting balance-Leahy Scale: Fair     Standing balance support: Bilateral upper extremity supported;During functional activity Standing balance-Leahy Scale: Poor                              Cognition Arousal/Alertness: Awake/alert Behavior During Therapy: WFL for tasks assessed/performed Overall Cognitive Status: Within Functional Limits for tasks assessed                                        Exercises Total Joint Exercises Ankle Circles/Pumps: AROM;Both;10 reps;Supine Quad Sets: AROM;Both;10 reps;Supine Gluteal Sets: AROM;Both;10 reps;Supine  Short Arc QuadBarbaraann Farrell;Left;10 reps;Supine Hip ABduction/ADduction: AAROM;Left;10 reps;Supine    General Comments        Pertinent Vitals/Pain Pain Score: 8  Pain Location: left hip Pain Descriptors / Indicators: Aching;Sharp Pain Intervention(s): Limited activity within patient's tolerance;Monitored during session    Home Living                      Prior Function            PT Goals (current goals can now be found in the care  plan section) Acute Rehab PT Goals Patient Stated Goal: return home after rehab PT Goal Formulation: With patient Time For Goal Achievement: 10/31/16 Potential to Achieve Goals: Good Progress towards PT goals: Progressing toward goals    Frequency    7X/week      PT Plan      Co-evaluation              AM-PAC PT "6 Clicks" Daily Activity  Outcome Measure  Difficulty turning over in bed (including adjusting bedclothes, sheets and blankets)?: Unable Difficulty moving from lying on back to sitting on the side of the bed? : Unable Difficulty sitting down on and standing up from a chair with arms (e.g., wheelchair, bedside commode, etc,.)?: Unable Help needed moving to and from a bed to chair (including a wheelchair)?: A Lot Help needed walking in hospital room?: A Lot Help needed climbing 3-5 steps with a railing? : Total 6 Click Score: 8    End of Session   Activity Tolerance: Patient limited by fatigue;Patient limited by pain Patient left: in chair;with call bell/phone within reach Nurse Communication: Mobility status PT Visit Diagnosis: Unsteadiness on feet (R26.81);Other abnormalities of gait and mobility (R26.89);Muscle weakness (generalized) (M62.81)     Time: 4098-1191 PT Time Calculation (min) (ACUTE ONLY): 31 min  Charges:  $Therapeutic Activity: 23-37 mins                    G Codes:         10:03 AM, 10-24-16 Ocie Bob, MPT Physical Therapist with [facility] [phone] office [phone] mobile phone

## 2016-10-19 ENCOUNTER — Encounter (HOSPITAL_COMMUNITY): Payer: Self-pay | Admitting: Orthopedic Surgery

## 2016-10-19 ENCOUNTER — Inpatient Hospital Stay
Admission: RE | Admit: 2016-10-19 | Discharge: 2016-11-23 | Disposition: A | Discharge status: Home / Self Care | Payer: Medicare HMO | Source: Ambulatory Visit | Attending: Internal Medicine | Admitting: Internal Medicine

## 2016-10-19 DIAGNOSIS — G20 Parkinson's disease: Secondary | ICD-10-CM | POA: Diagnosis not present

## 2016-10-19 DIAGNOSIS — M6281 Muscle weakness (generalized): Secondary | ICD-10-CM | POA: Diagnosis not present

## 2016-10-19 DIAGNOSIS — R278 Other lack of coordination: Secondary | ICD-10-CM | POA: Diagnosis not present

## 2016-10-19 DIAGNOSIS — Z9889 Other specified postprocedural states: Secondary | ICD-10-CM | POA: Diagnosis not present

## 2016-10-19 DIAGNOSIS — W19XXXS Unspecified fall, sequela: Secondary | ICD-10-CM | POA: Diagnosis not present

## 2016-10-19 DIAGNOSIS — R338 Other retention of urine: Secondary | ICD-10-CM | POA: Diagnosis not present

## 2016-10-19 DIAGNOSIS — E039 Hypothyroidism, unspecified: Secondary | ICD-10-CM | POA: Diagnosis not present

## 2016-10-19 DIAGNOSIS — R2689 Other abnormalities of gait and mobility: Secondary | ICD-10-CM | POA: Diagnosis not present

## 2016-10-19 DIAGNOSIS — S22000A Wedge compression fracture of unspecified thoracic vertebra, initial encounter for closed fracture: Secondary | ICD-10-CM

## 2016-10-19 DIAGNOSIS — S7292XD Unspecified fracture of left femur, subsequent encounter for closed fracture with routine healing: Secondary | ICD-10-CM | POA: Diagnosis not present

## 2016-10-19 DIAGNOSIS — I82432 Acute embolism and thrombosis of left popliteal vein: Secondary | ICD-10-CM | POA: Diagnosis not present

## 2016-10-19 DIAGNOSIS — N390 Urinary tract infection, site not specified: Secondary | ICD-10-CM | POA: Diagnosis not present

## 2016-10-19 DIAGNOSIS — K5900 Constipation, unspecified: Secondary | ICD-10-CM | POA: Diagnosis not present

## 2016-10-19 DIAGNOSIS — R498 Other voice and resonance disorders: Secondary | ICD-10-CM | POA: Diagnosis not present

## 2016-10-19 DIAGNOSIS — F329 Major depressive disorder, single episode, unspecified: Secondary | ICD-10-CM | POA: Diagnosis not present

## 2016-10-19 DIAGNOSIS — M5136 Other intervertebral disc degeneration, lumbar region: Secondary | ICD-10-CM | POA: Diagnosis not present

## 2016-10-19 DIAGNOSIS — I10 Essential (primary) hypertension: Secondary | ICD-10-CM | POA: Diagnosis not present

## 2016-10-19 DIAGNOSIS — E785 Hyperlipidemia, unspecified: Secondary | ICD-10-CM | POA: Diagnosis not present

## 2016-10-19 DIAGNOSIS — S72002A Fracture of unspecified part of neck of left femur, initial encounter for closed fracture: Secondary | ICD-10-CM | POA: Diagnosis not present

## 2016-10-19 DIAGNOSIS — Y92009 Unspecified place in unspecified non-institutional (private) residence as the place of occurrence of the external cause: Secondary | ICD-10-CM | POA: Diagnosis not present

## 2016-10-19 DIAGNOSIS — R600 Localized edema: Principal | ICD-10-CM

## 2016-10-19 DIAGNOSIS — S72002D Fracture of unspecified part of neck of left femur, subsequent encounter for closed fracture with routine healing: Secondary | ICD-10-CM | POA: Diagnosis not present

## 2016-10-19 LAB — CBC
HCT: 26.1 % — ABNORMAL LOW (ref 36.0–46.0)
HEMOGLOBIN: 8.9 g/dL — AB (ref 12.0–15.0)
MCH: 31.1 pg (ref 26.0–34.0)
MCHC: 34.1 g/dL (ref 30.0–36.0)
MCV: 91.3 fL (ref 78.0–100.0)
PLATELETS: 183 10*3/uL (ref 150–400)
RBC: 2.86 MIL/uL — AB (ref 3.87–5.11)
RDW: 12.4 % (ref 11.5–15.5)
WBC: 6.3 10*3/uL (ref 4.0–10.5)

## 2016-10-19 LAB — BPAM RBC
Blood Product Expiration Date: 201810062359
Blood Product Expiration Date: 201810182359
Unit Type and Rh: 6200
Unit Type and Rh: 6200

## 2016-10-19 LAB — TYPE AND SCREEN
ABO/RH(D): A POS
ANTIBODY SCREEN: NEGATIVE
UNIT DIVISION: 0
Unit division: 0

## 2016-10-19 MED ORDER — ALPRAZOLAM 0.5 MG PO TABS: 0.5000 mg | ORAL_TABLET | Freq: Every evening | ORAL | 0 refills | Status: DC | PRN

## 2016-10-19 MED ORDER — ASPIRIN 325 MG PO TBEC: 325.0000 mg | DELAYED_RELEASE_TABLET | Freq: Every day | ORAL | Status: DC

## 2016-10-19 MED ORDER — DOCUSATE SODIUM 100 MG PO CAPS: 100.0000 mg | ORAL_CAPSULE | Freq: Two times a day (BID) | ORAL | 0 refills | Status: AC

## 2016-10-19 MED ORDER — BISACODYL 10 MG RE SUPP: 10.0000 mg | Freq: Every day | RECTAL | 0 refills | Status: AC | PRN

## 2016-10-19 MED ORDER — TAMSULOSIN HCL 0.4 MG PO CAPS: 0.4000 mg | ORAL_CAPSULE | Freq: Every day | ORAL | Status: DC

## 2016-10-19 MED ORDER — VITAMIN D3 25 MCG (1000 UNIT) PO TABS: 1000.0000 [IU] | ORAL_TABLET | Freq: Every day | ORAL | Status: AC

## 2016-10-19 MED ORDER — SORBITOL 70 % SOLN
960.0000 mL | TOPICAL_OIL | Freq: Once | ORAL | Status: AC
  Administered 2016-10-19: 960 mL via RECTAL
  Filled 2016-10-19: qty 240

## 2016-10-19 MED ORDER — OXYCODONE HCL 5 MG PO TABS: 5.0000 mg | ORAL_TABLET | Freq: Four times a day (QID) | ORAL | 0 refills | Status: DC | PRN

## 2016-10-19 NOTE — Progress Notes (Signed)
Patient constipated and c/o abdominal pain, hard stool felt in rectal vault when dulcolax suppository given, no results obtained, MD notified, SMOG enema ordered and given, returns large amount of hard stool.

## 2016-10-19 NOTE — Clinical Social Work Placement (Signed)
   CLINICAL SOCIAL WORK PLACEMENT  NOTE  Date:  10/19/2016  Patient Details  Name: Julie Farrell MRN: [id number] Date of Birth: February 17, 1945  Clinical Social Work is seeking post-discharge placement for this patient at the Skilled  Nursing Facility level of care (*CSW will initial, date and re-position this form in  chart as items are completed):      Patient/family provided with [facility] Clinical Social Work Department's list of facilities offering this level of care within the geographic area requested by the patient (or if unable, by the patient's family).      Patient/family informed of their freedom to choose among providers that offer the needed level of care, that participate in Medicare, Medicaid or managed care program needed by the patient, have an available bed and are willing to accept the patient.      Patient/family informed of 's ownership interest in [facility] and [facility], as well as of the fact that they are under no obligation to receive care at these facilities.  PASRR submitted to EDS on 10/16/16     PASRR number received on 10/16/16     Existing PASRR number confirmed on       FL2 transmitted to all facilities in geographic area requested by pt/family on 10/16/16     FL2 transmitted to all facilities within larger geographic area on       Patient informed that his/her managed care company has contracts with or will negotiate with certain facilities, including the following:        Yes   Patient/family informed of bed offers received.  Patient chooses bed at [facility]     Physician recommends and patient chooses bed at      Patient to be transferred to [facility] on 10/19/16.  Patient to be transferred to facility by [facility] staff     Patient family notified on 10/19/16 of transfer.  Name of family member notified:  Mother at bedside     PHYSICIAN       Additional Comment:  Clinicals sent via [employer]. 5 day  LOG approved, completed and emailed to [place]. LCSW signing off.   _______________________________________________ Annice Needy, LCSW 10/19/2016, 12:42 PM

## 2016-10-19 NOTE — Progress Notes (Signed)
Physical Therapy Treatment Patient Details Name: Julie Farrell MRN: [id number] DOB: 1945-06-22 Today's Date: 10/19/2016    History of Present Illness Julie Farrell is a 71 y.o. female s/p ARTHROPLASTY BIPOLAR HIP (HEMIARTHROPLASTY) (Left) secondary left hip femoral neck fracture with medical history significant of HTN, parkinsons, comes in after falling today and hurting her left leg.  The pain was so severe she could not get up so therefore came to the ED.  Pt walks with a walker.  Lives with her daughter.  Had a back injury last week but not from a fall.  Has issues with her legs "giving out".  Otherwise in normal state of health.  Pt referred for admission for left femur fx.  Dr Romeo Apple called and will see pt in am..    PT Comments    Patient continues to having difficulty taking longer steps with BLE due to generalized weakness and limited ankle dorsiflexion.  Patient tolerated sitting on [facility] with nursing staff supervising after therapy.  Patient will benefit from continued physical therapy in hospital and recommended venue below to increase strength, balance, endurance for safe ADLs and gait.    Follow Up Recommendations  SNF;Supervision/Assistance - 24 hour     Equipment Recommendations  None recommended by PT    Recommendations for Other Services       Precautions / Restrictions Precautions Precautions: Fall Precaution Comments: hip precautions    Mobility  Bed Mobility Overal bed mobility: Needs Assistance Bed Mobility: Supine to Sit;Sit to Supine     Supine to sit: Mod assist;Max assist Sit to supine: Max assist   General bed mobility comments: demonstrates slight improvement for use BUE to assist with scooting forward at bedside  Transfers Overall transfer level: Needs assistance Equipment used: Rolling walker (2 wheeled) Transfers: Sit to/from [employer] Sit to Stand: Mod assist Stand pivot transfers: Mod assist;Max assist       General  transfer comment: very labored slow movement  Ambulation/Gait Ambulation/Gait assistance: Max assist Ambulation Distance (Feet): 4 Feet Assistive device: Rolling walker (2 wheeled) Gait Pattern/deviations: Decreased step length - right;Decreased step length - left;Decreased stance time - left;Decreased stride length   Gait velocity interpretation: Below normal speed for age/gender General Gait Details: limited to a few steps to transfer to [facility] secondary to fatigue/BLE weakness   Stairs            Wheelchair Mobility    Modified Rankin (Stroke Patients Only)       Balance   Sitting-balance support: Feet supported;No upper extremity supported Sitting balance-Leahy Scale: Fair     Standing balance support: Bilateral upper extremity supported;During functional activity Standing balance-Leahy Scale: Poor                              Cognition Arousal/Alertness: Awake/alert Behavior During Therapy: WFL for tasks assessed/performed Overall Cognitive Status: Within Functional Limits for tasks assessed                                        Exercises      General Comments        Pertinent Vitals/Pain Pain Score: 6  Pain Location: left hip Pain Descriptors / Indicators: Aching;Sharp Pain Intervention(s): Limited activity within patient's tolerance;Monitored during session    Home Living  Prior Function            PT Goals (current goals can now be found in the care plan section) Acute Rehab PT Goals Patient Stated Goal: return home after rehab PT Goal Formulation: With patient Time For Goal Achievement: 10/31/16 Potential to Achieve Goals: Good Progress towards PT goals: Progressing toward goals    Frequency    7X/week      PT Plan Current plan remains appropriate    Co-evaluation              AM-PAC PT "6 Clicks" Daily Activity  Outcome Measure  Difficulty turning over in bed  (including adjusting bedclothes, sheets and blankets)?: A Lot Difficulty moving from lying on back to sitting on the side of the bed? : A Lot Difficulty sitting down on and standing up from a chair with arms (e.g., wheelchair, bedside commode, etc,.)?: A Lot Help needed moving to and from a bed to chair (including a wheelchair)?: A Lot Help needed walking in hospital room?: A Lot Help needed climbing 3-5 steps with a railing? : Total 6 Click Score: 11    End of Session   Activity Tolerance: Patient limited by fatigue;Patient limited by pain Patient left: with call bell/phone within reach;with family/visitor present;with nursing/sitter in room (patient left on [facility] with nursing staff/family member present) Nurse Communication: Mobility status PT Visit Diagnosis: Unsteadiness on feet (R26.81);Other abnormalities of gait and mobility (R26.89);Muscle weakness (generalized) (M62.81)     Time: 9528-4132 PT Time Calculation (min) (ACUTE ONLY): 15 min  Charges:  $Therapeutic Activity: 8-22 mins                    G Codes:       2:34 PM, Nov 07, 2016 Ocie Bob, MPT Physical Therapist with [facility] [phone] office [phone] mobile phone

## 2016-10-19 NOTE — Care Management Important Message (Signed)
Important Message  Patient Details  Name: Julie Farrell MRN: [id number] Date of Birth: 06/18/1945   Medicare Important Message Given:  Yes    Malcolm Metro, RN 10/19/2016, 10:10 AM

## 2016-10-19 NOTE — Progress Notes (Signed)
Reported to Gwynneth Macleod, RN at [facility]

## 2016-10-19 NOTE — Discharge Instructions (Signed)
Follow with Primary MD  [facility], [facility]  and other consultant's as instructed your Hospitalist MD  Please get a complete blood count and chemistry panel checked by your Primary MD at your next visit, and again as instructed by your Primary MD.  Get Medicines reviewed and adjusted: Please take all your medications with you for your next visit with your Primary MD  Laboratory/radiological data: Please request your Primary MD to go over all hospital tests and procedure/radiological results at the follow up, please ask your Primary MD to get all Hospital records sent to his/her office.  In some cases, they will be blood work, cultures and biopsy results pending at the time of your discharge. Please request that your primary care M.D. follows up on these results.  Also Note the following: If you experience worsening of your admission symptoms, develop shortness of breath, life threatening emergency, suicidal or homicidal thoughts you must seek medical attention immediately by calling 911 or calling your MD immediately  if symptoms less severe.  You must read complete instructions/literature along with all the possible adverse reactions/side effects for all the Medicines you take and that have been prescribed to you. Take any new Medicines after you have completely understood and accpet all the possible adverse reactions/side effects.   Do not drive when taking Pain medications or sleeping medications (Benzodaizepines)  Do not take more than prescribed Pain, Sleep and Anxiety Medications. It is not advisable to combine anxiety,sleep and pain medications without talking with your primary care practitioner  Special Instructions: If you have smoked or chewed Tobacco  in the last 2 yrs please stop smoking, stop any regular Alcohol  and or any Recreational drug use.  Wear Seat belts while driving.  Please note: You were cared for by a hospitalist during your hospital stay. Once you  are discharged, your primary care physician will handle any further medical issues. Please note that NO REFILLS for any discharge medications will be authorized once you are discharged, as it is imperative that you return to your primary care physician (or establish a relationship with a primary care physician if you do not have one) for your post hospital discharge needs so that they can reassess your need for medications and monitor your lab values.

## 2016-10-19 NOTE — Progress Notes (Signed)
Discharged to [facility] in stable condition via bed with staff.

## 2016-10-19 NOTE — Care Management Note (Signed)
Case Management Note  Patient Details  Name: Julie Farrell MRN: [id number] Date of Birth: 1945-06-11  Subjective/Objective:                  Admitted with femure fx. S/p repair. Pt from home, ind at baseline. Recommended for SNF, CSW aware and will make placement arrangements.   Action/Plan: DC to SNF today. No CM needs noted at DC.  Expected Discharge Date:  10/19/16               Expected Discharge Plan:  Skilled Nursing Facility  In-House Referral:  Clinical Social Work  Discharge planning Services  CM Consult  Post Acute Care Choice:  NA Choice offered to:  NA  Status of Service:  Completed, signed off  Malcolm Metro, RN 10/19/2016, 10:10 AM

## 2016-10-19 NOTE — Discharge Summary (Addendum)
Physician Discharge Summary  Julie Farrell BJY:[id number] DOB: 1945-02-03 DOA: 10/15/2016  PCP: [facility]  Admit date: 10/15/2016 Discharge date: 10/19/2016  Admitted From: Home  Disposition: SNF  Recommendations for Outpatient Follow-up:  1. Follow up with PCP and orthopedics in 2 weeks 2. Please obtain BMP/CBC in one week 3. Monitor urine output and ability to void closely  Discharge Condition: STABLE   CODE STATUS: FULL    Brief Hospitalization Summary: Please see all hospital notes, images, labs for full details of the hospitalization.  HPI: Julie Farrell is a 71 y.o. female with medical history significant of HTN, parkinsons, comes in after falling today and hurting her left leg.  The pain was so severe she could not get up so therefore came to the ED.  Pt walks with a walker.  Lives with her daughter.  Had a back injury last week but not from a fall.  Has issues with her legs "giving out".  Otherwise in normal state of health.  Pt referred for admission for left femur fx.  Dr Romeo Apple called and will see pt in am.  Brief Admission Hx: Julie Farrell a 71 y.o.femalewith medical history significant of HTN, parkinsons, comes in after falling today and hurting her left leg. The pain was so severe she could not get up so therefore came to the ED. Pt walks with a walker. Lives with her daughter. Had a back injury last week but not from a fall. Has issues with her legs "giving out". Otherwise in normal state of health. Pt referred for admission for left femur fx.   MDM/Assessment & Plan:   1. Acute left femur fracture - Pt POD#3 s/p left hip repair by Dr. Romeo Apple.  Ambulate with PT, Pain management. SNF placement arranged. Aspirin increased to 325 mg daily for DVT prophylaxis.  Follow up with orthopedics.  2. Acute urinary retention - pt was able to urinate on her own yesterday and no foley was needed.  Continue flomax, monitor urine output and ability to  void closely.  3. Parkinson's Disease - stable follow up with her neurologist.  4. Hypothyroidism - resume home levothyroxine.  5. Vit D deficiency - oral Vit D ordered.  6. Hyperlipidemia - stable, resume home meds.  7. Acute T9 compression fracture - stable, manage symptomatically no surgical needs per ortho 8. Fall at home - Pt will receive ongoing PT in the SNF.   DVT prophylaxis: SCDs Code Status: FULL  Family Communication: bedside Disposition Plan: SNF  Consultants:  orthopedics  Discharge Diagnoses:  Principal Problem:   Femur fracture, left (HCC) Active Problems:   Depression   Parkinson's disease (HCC)   Hypertension   Anxiety   Hyperlipidemia   Hypothyroidism   Fall at home, sequela   Compression fracture of body of thoracic vertebra (HCC)   Nonspecific intraventricular block, old   Acute urinary retention  Discharge Instructions:  Allergies as of 10/19/2016      Reactions   Vicodin [hydrocodone-acetaminophen] Other (See Comments)   Upset stomach      Medication List    STOP taking these medications   carbidopa-levodopa 25-100 MG tablet Commonly known as:  SINEMET IR   ibuprofen 200 MG tablet Commonly known as:  ADVIL,MOTRIN   lubiprostone 24 MCG capsule Commonly known as:  AMITIZA   ondansetron 4 MG disintegrating tablet Commonly known as:  ZOFRAN ODT     TAKE these medications   ALPRAZolam 0.5 MG tablet Commonly known as:  XANAX Take 1 tablet (0.5 mg total) by mouth at bedtime as needed for sleep. What changed:  how much to take  how to take this  when to take this  reasons to take this   amLODipine 5 MG tablet Commonly known as:  NORVASC Take 5 mg by mouth daily.   aspirin 325 MG EC tablet Take 1 tablet (325 mg total) by mouth daily. What changed:  medication strength  how much to take   bisacodyl 10 MG suppository Commonly known as:  DULCOLAX Place 1 suppository (10 mg total) rectally daily as needed for moderate  constipation.   cholecalciferol 1000 units tablet Commonly known as:  VITAMIN D Take 1 tablet (1,000 Units total) by mouth daily.   docusate sodium 100 MG capsule Commonly known as:  COLACE Take 1 capsule (100 mg total) by mouth 2 (two) times daily.   escitalopram 10 MG tablet Commonly known as:  LEXAPRO Take 1 tablet (10 mg total) by mouth daily.   levothyroxine 50 MCG tablet Commonly known as:  SYNTHROID, LEVOTHROID Take 50 mcg by mouth daily.   OMEGA 3-6-9 FATTY ACIDS PO Take 1 tablet by mouth daily.   oxyCODONE 5 MG immediate release tablet Commonly known as:  Oxy IR/ROXICODONE Take 1 tablet (5 mg total) by mouth every 6 (six) hours as needed for severe pain ((for MODERATE breakthrough pain)).   simvastatin 20 MG tablet Commonly known as:  ZOCOR Take 20 mg by mouth at bedtime.   tamsulosin 0.4 MG Caps capsule Commonly known as:  FLOMAX Take 1 capsule (0.4 mg total) by mouth daily.   vitamin B-12 100 MCG tablet Commonly known as:  CYANOCOBALAMIN Take 100 mcg by mouth daily.            Discharge Care Instructions        Start     Ordered   10/19/16 0000  ALPRAZolam (XANAX) 0.5 MG tablet  At bedtime PRN     10/19/16 0921   10/19/16 0000  aspirin EC 325 MG EC tablet  Daily     10/19/16 0921   10/19/16 0000  bisacodyl (DULCOLAX) 10 MG suppository  Daily PRN     10/19/16 0921   10/19/16 0000  cholecalciferol (VITAMIN D) 1000 units tablet  Daily     10/19/16 0921   10/19/16 0000  docusate sodium (COLACE) 100 MG capsule  2 times daily     10/19/16 0921   10/19/16 0000  oxyCODONE (OXY IR/ROXICODONE) 5 MG immediate release tablet  Every 6 hours PRN     10/19/16 0921   10/19/16 0000  tamsulosin (FLOMAX) 0.4 MG CAPS capsule  Daily     10/19/16 0921     Follow-up Information    [facility], [facility]. Schedule an appointment as soon as possible for a visit in 2 week(s).   Specialty:  Family Medicine Contact information: [address] [place] [address] [phone]        Vickki Hearing, MD. Schedule an appointment as soon as possible for a visit in 2 week(s).   Specialties:  Orthopedic Surgery, Radiology Contact information: [address] [place] [address] [phone]          Allergies  Allergen Reactions  . Vicodin [Hydrocodone-Acetaminophen] Other (See Comments)    Upset stomach   Current Discharge Medication List    START taking these medications   Details  bisacodyl (DULCOLAX) 10 MG suppository Place 1 suppository (10 mg total) rectally  daily as needed for moderate constipation. Qty: 12 suppository, Refills: 0    cholecalciferol (VITAMIN D) 1000 units tablet Take 1 tablet (1,000 Units total) by mouth daily.    docusate sodium (COLACE) 100 MG capsule Take 1 capsule (100 mg total) by mouth 2 (two) times daily. Qty: 10 capsule, Refills: 0    oxyCODONE (OXY IR/ROXICODONE) 5 MG immediate release tablet Take 1 tablet (5 mg total) by mouth every 6 (six) hours as needed for severe pain ((for MODERATE breakthrough pain)). Qty: 10 tablet, Refills: 0    tamsulosin (FLOMAX) 0.4 MG CAPS capsule Take 1 capsule (0.4 mg total) by mouth daily. Qty: 30 capsule      CONTINUE these medications which have CHANGED   Details  ALPRAZolam (XANAX) 0.5 MG tablet Take 1 tablet (0.5 mg total) by mouth at bedtime as needed for sleep. Qty: 10 tablet, Refills: 0    aspirin EC 325 MG EC tablet Take 1 tablet (325 mg total) by mouth daily.      CONTINUE these medications which have NOT CHANGED   Details  amLODipine (NORVASC) 5 MG tablet Take 5 mg by mouth daily.  Refills: 1    escitalopram (LEXAPRO) 10 MG tablet Take 1 tablet (10 mg total) by mouth daily. Qty: 30 tablet, Refills: 6    levothyroxine (SYNTHROID, LEVOTHROID) 50 MCG tablet Take 50 mcg by mouth daily. Refills: 0    OMEGA 3-6-9 FATTY ACIDS PO Take 1 tablet by mouth daily.    simvastatin (ZOCOR) 20 MG tablet Take 20 mg by mouth at  bedtime.    vitamin B-12 (CYANOCOBALAMIN) 100 MCG tablet Take 100 mcg by mouth daily.      STOP taking these medications     ibuprofen (ADVIL,MOTRIN) 200 MG tablet      carbidopa-levodopa (SINEMET IR) 25-100 MG tablet      lubiprostone (AMITIZA) 24 MCG capsule      ondansetron (ZOFRAN ODT) 4 MG disintegrating tablet        Procedures/Studies: Dg Chest 1 View  Result Date: 10/15/2016 CLINICAL DATA:  Preop for hip surgery. EXAM: CHEST 1 VIEW COMPARISON:  Radiographs of August 05, 2016. FINDINGS: The heart size and mediastinal contours are within normal limits. Both lungs are clear. No pneumothorax or pleural effusion is noted. The visualized skeletal structures are unremarkable. IMPRESSION: No acute cardiopulmonary abnormality seen. Electronically Signed   By: Lupita Raider, M.D.   On: 10/15/2016 14:52   Dg Thoracic Spine 2 View  Result Date: 10/15/2016 CLINICAL DATA:  Thoracic spine pain after fall. EXAM: THORACIC SPINE 2 VIEWS COMPARISON:  Radiographs of August 05, 2016. FINDINGS: There is interval development of severe compression deformity T9 vertebral body concerning for acute to subacute fracture. No spondylolisthesis is noted. Disc spaces appear to be well maintained. IMPRESSION: Interval development of severe compression fracture involving T9 vertebral body concerning for acute to subacute fracture. Further evaluation with MRI is recommended. Electronically Signed   By: Lupita Raider, M.D.   On: 10/15/2016 14:55   Dg Elbow Complete Left  Result Date: 10/15/2016 CLINICAL DATA:  Larey Seat today with left elbow pain EXAM: LEFT ELBOW - COMPLETE 3+ VIEW COMPARISON:  None. FINDINGS: The bones are diffusely osteopenic. Alignment is normal. Joint spaces appear normal. No fracture is seen. No joint effusion is noted. IMPRESSION: Osteopenia.  No acute fracture. Electronically Signed   By: Dwyane Dee M.D.   On: 10/15/2016 13:10   Mr Thoracic Spine Wo Contrast  Result Date: 10/15/2016 CLINICAL  DATA:  Fall with severe back pain. History of lumbar fusion. EXAM: MRI THORACIC SPINE WITHOUT CONTRAST TECHNIQUE: Multiplanar, multisequence MR imaging of the thoracic spine was performed. No intravenous contrast was administered. COMPARISON:  Thoracic spine radiograph 10/15/2016 FINDINGS: Alignment: There is exaggerated thoracic kyphosis centered at the T8-T9 level. No static subluxation. Vertebrae: There is an anterior compression fracture of the T9 vertebral body with mild edema. There is approximately 75% anterior height loss. No retropulsion. Signal abnormality in the anterior T8 vertebral body this is probably a hemangioma. Cord:  Normal signal and morphology. Paraspinal and other soft tissues: Negative. Disc levels: No spinal canal or neural foraminal stenosis of the thoracic spine. IMPRESSION: 1. Acute anterior compression fracture of T9 with approximately 75% height loss anteriorly. No retropulsion. 2. Increased thoracic kyphosis centered at the level of the fracture. 3. Trabeculated T1/T2 hyperintense focus in the anterior T8 vertebral body is suspected to be a hemangioma. Electronically Signed   By: Deatra Robinson M.D.   On: 10/15/2016 17:51   Dg Pelvis Portable  Result Date: 10/16/2016 CLINICAL DATA:  Left hip fracture, postop EXAM: PORTABLE PELVIS 1-2 VIEWS COMPARISON:  10/15/2016 FINDINGS: Partially visualized lower lumbar spine hardware. SI joints are symmetric bilaterally. Right femoral head projects in joint. Pubic symphysis is intact. Interval left hip replacement with normal alignment. Soft tissue gas and cutaneous staples over the left hip. IMPRESSION: Interval left hip replacement with normal alignment. Soft tissue gas and cutaneous staples consistent with recent postsurgical change. Electronically Signed   By: Jasmine Pang M.D.   On: 10/16/2016 16:22   Dg Hip Unilat With Pelvis 2-3 Views Left  Result Date: 10/15/2016 CLINICAL DATA:  Fall.  Hip pain. EXAM: DG HIP (WITH OR WITHOUT PELVIS)  2-3V LEFT COMPARISON:  08/14/2016 . FINDINGS: Angulated fracture noted of the left femoral neck. L4-L5 posterior and interbody fusion. Diffuse osteopenia degenerative change. Aortoiliac atherosclerotic vascular calcification. IMPRESSION: 1. Angulated fracture noted of the left femoral neck. 2. Diffuse osteopenia degenerative change. L4-L5 posterior interbody fusion. Electronically Signed   By: Maisie Fus  Register   On: 10/15/2016 13:10      Subjective: Pt was able to urinate on her own yesterday.    Discharge Exam: Vitals:   10/18/16 2108 10/19/16 0614  BP: (!) 122/59 (!) 108/48  Pulse: 76 89  Resp: 18 16  Temp: 98.1 F (36.7 C) 98.5 F (36.9 C)  SpO2: 97% 97%   Vitals:   10/18/16 0449 10/18/16 1300 10/18/16 2108 10/19/16 0614  BP: (!) 126/50 (!) 113/49 (!) 122/59 (!) 108/48  Pulse: 81 78 76 89  Resp: Temp: 98.6 F (37 C) 97.8 F (36.6 C) 98.1 F (36.7 C) 98.5 F (36.9 C)  TempSrc: Oral Oral Oral Oral  SpO2: 99% 100% 97% 97%  Weight:      Height:       General exam: thin frail lady, sitting up in chair, awake, alert, NAD. Cooperative.  Respiratory system:  No increased work of breathing. Cardiovascular system: S1 & S2 heard.  No JVD, murmurs, gallops, clicks or pedal edema. Gastrointestinal system: Abdomen is nondistended, soft and nontender. Normal bowel sounds heard. Central nervous system: Alert and oriented. No focal neurological deficits. Extremities: good perfusion noted. Wounds c/d/i, good alignment   The results of significant diagnostics from this hospitalization (including imaging, microbiology, ancillary and laboratory) are listed below for reference.     Microbiology: Recent Results (from the past 240 hour(s))  Surgical pcr screen  Status: None   Collection Time: 10/15/16 10:13 PM  Result Value Ref Range Status   MRSA, PCR NEGATIVE NEGATIVE Final   Staphylococcus aureus NEGATIVE NEGATIVE Final    Comment: (NOTE) The Xpert SA Assay (FDA  approved for NASAL specimens in patients 37 years of age and older), is one component of a comprehensive surveillance program. It is not intended to diagnose infection nor to guide or monitor treatment.      Labs: BNP (last 3 results) No results for input(s): BNP in the last 8760 hours. Basic Metabolic Panel:  Recent Labs Lab 10/15/16 1425 10/16/16 0638 10/17/16 0555  NA 137 139 137  K 3.9 4.0 4.0  CL 104 104 104  CO2 GLUCOSE 108* 115* 95  BUN CREATININE 0.78 0.68 0.74  CALCIUM 10.1 9.8 9.1   Liver Function Tests: No results for input(s): AST, ALT, ALKPHOS, BILITOT, PROT, ALBUMIN in the last 168 hours. No results for input(s): LIPASE, AMYLASE in the last 168 hours. No results for input(s): AMMONIA in the last 168 hours. CBC:  Recent Labs Lab 10/15/16 1425 10/16/16 0638 10/17/16 0555 10/18/16 0611 10/19/16 0649  WBC 8.3 7.6 7.1 7.3 6.3  NEUTROABS 7.2  --   --   --   --   HGB 13.2 12.5 10.4* 10.3* 8.9*  HCT 39.1 37.0 30.3* 29.6* 26.1*  MCV 91.6 91.8 91.5 90.8 91.3  PLT 236 216 194 213 183   Cardiac Enzymes: No results for input(s): CKTOTAL, CKMB, CKMBINDEX, TROPONINI in the last 168 hours. BNP: Invalid input(s): POCBNP CBG: No results for input(s): GLUCAP in the last 168 hours. D-Dimer No results for input(s): DDIMER in the last 72 hours. Hgb A1c No results for input(s): HGBA1C in the last 72 hours. Lipid Profile No results for input(s): CHOL, HDL, LDLCALC, TRIG, CHOLHDL, LDLDIRECT in the last 72 hours. Thyroid function studies No results for input(s): TSH, T4TOTAL, T3FREE, THYROIDAB in the last 72 hours.  Invalid input(s): FREET3 Anemia work up No results for input(s): VITAMINB12, FOLATE, FERRITIN, TIBC, IRON, RETICCTPCT in the last 72 hours. Urinalysis    Component Value Date/Time   COLORURINE STRAW (A) 08/05/2016 0925   APPEARANCEUR CLEAR 08/05/2016 0925   LABSPEC 1.005 08/05/2016 0925   PHURINE 6.0 08/05/2016 0925   GLUCOSEU  NEGATIVE 08/05/2016 0925   HGBUR MODERATE (A) 08/05/2016 0925   BILIRUBINUR NEGATIVE 08/05/2016 0925   KETONESUR 5 (A) 08/05/2016 0925   PROTEINUR NEGATIVE 08/05/2016 0925   NITRITE NEGATIVE 08/05/2016 0925   LEUKOCYTESUR NEGATIVE 08/05/2016 0925   Sepsis Labs Invalid input(s): PROCALCITONIN,  WBC,  LACTICIDVEN Microbiology Recent Results (from the past 240 hour(s))  Surgical pcr screen     Status: None   Collection Time: 10/15/16 10:13 PM  Result Value Ref Range Status   MRSA, PCR NEGATIVE NEGATIVE Final   Staphylococcus aureus NEGATIVE NEGATIVE Final    Comment: (NOTE) The Xpert SA Assay (FDA approved for NASAL specimens in patients 19 years of age and older), is one component of a comprehensive surveillance program. It is not intended to diagnose infection nor to guide or monitor treatment.    Time coordinating discharge: 33 minutes  SIGNED:  Standley Dakins, MD  [facility] 10/19/2016, 9:22 AM Pager [phone]  If 7PM-7AM, please contact night-coverage [email or web address] Password [email or web address]

## 2016-10-20 ENCOUNTER — Non-Acute Institutional Stay (SKILLED_NURSING_FACILITY): Payer: Medicare HMO | Admitting: Internal Medicine

## 2016-10-20 ENCOUNTER — Encounter: Payer: Self-pay | Admitting: Internal Medicine

## 2016-10-20 DIAGNOSIS — I10 Essential (primary) hypertension: Secondary | ICD-10-CM | POA: Diagnosis not present

## 2016-10-20 DIAGNOSIS — F329 Major depressive disorder, single episode, unspecified: Secondary | ICD-10-CM | POA: Diagnosis not present

## 2016-10-20 DIAGNOSIS — K5900 Constipation, unspecified: Secondary | ICD-10-CM | POA: Diagnosis not present

## 2016-10-20 DIAGNOSIS — E039 Hypothyroidism, unspecified: Secondary | ICD-10-CM

## 2016-10-20 DIAGNOSIS — R338 Other retention of urine: Secondary | ICD-10-CM

## 2016-10-20 DIAGNOSIS — E785 Hyperlipidemia, unspecified: Secondary | ICD-10-CM | POA: Diagnosis not present

## 2016-10-20 DIAGNOSIS — G20 Parkinson's disease: Secondary | ICD-10-CM

## 2016-10-20 DIAGNOSIS — Z9889 Other specified postprocedural states: Secondary | ICD-10-CM | POA: Insufficient documentation

## 2016-10-20 DIAGNOSIS — F32A Depression, unspecified: Secondary | ICD-10-CM

## 2016-10-20 NOTE — Progress Notes (Signed)
Provider:  Einar Crow Location:   [facility] Room Number: 131/P Place of Service:  SNF (31)  PCP: [facility] Patient Care Team: [facility], [phone] [facility] as PCP - General (Family Medicine)  Extended Emergency Contact Information Primary Emergency Contact: Dockery,Teresa Address: [facility] [address]          [place], [place] [address] [place] Home Phone: [phone] Mobile Phone: [phone] Relation: Daughter  Code Status: Full Code Goals of Care: Advanced Directive information Advanced Directives 10/20/2016  Does Patient Have a Medical Advance Directive? Yes  Type of Advance Directive (No Data)  Does patient want to make changes to medical advance directive? No - Patient declined  Would patient like information on creating a medical advance directive? No - Patient declined  Pre-existing out of facility DNR order (yellow form or pink MOST form) -      Chief Complaint  Patient presents with  . New Admit To SNF    New Admission Visit,     HPI: Patient is a 71 y.o. female seen today for admission to SNF for therapy after undergoing Left Hemiarthroplasty For Left hip femoral neck Fracture on 09/28  Patient has h/o  Hypertension, Parkinson Disese,  Hyperlipidemia. Hypothyroid, and depression She says she fell at home and came to the ED for Evaluation. She says her leg just gave up and she fell. She was found to have Left Femoral Neck Fracture. She underwent surgery on 09/28 Post op Course was uncomplicated except she had Urinary Retention and was treated with Flomax. She is doing well in facility. Her pain is controlled. She denies any Dysuria. Just increase Frequency and Urgency.  She says she lives with her daughter. Walks with the [place] or walker at home. Has h/o Recurrent falls. Seen Neurologist and getting Rehab at home.   Past Medical History:  Diagnosis Date  . Abnormal pap   . Anxiety   . DDD (degenerative disc  disease), lumbar   . Hemorrhoids   . HPV test positive 05/17/2013  . Hyperlipidemia   . Hypertension   . Hypothyroidism   . Parkinson's disease (HCC)   . Thyroid disease   . Vaginal Pap smear, abnormal    Past Surgical History:  Procedure Laterality Date  . BACK SURGERY  10/2015   fusion L4-L 5, Dr Lovell Sheehan  . CATARACT EXTRACTION W/PHACO Left 05/16/2012   Procedure: CATARACT EXTRACTION PHACO AND INTRAOCULAR LENS PLACEMENT (IOC);  Surgeon: Gemma Payor, MD;  Location: [facility];  Service: Ophthalmology;  Laterality: Left;  CDE:15.20  . COLONOSCOPY  2009   [facility]-Dr. Karilyn Cota  . EYE SURGERY Bilateral 14,15   cataracts  . Fatty Tissue Excision Left 2008   [facility]-Dr. Hilda Lias arm  . HIP ARTHROPLASTY Left 10/16/2016   Procedure: ARTHROPLASTY BIPOLAR HIP (HEMIARTHROPLASTY);  Surgeon: Vickki Hearing, MD;  Location: [facility];  Service: Orthopedics;  Laterality: Left;  . right ankle Right    fx    reports that she has never smoked. She has never used smokeless tobacco. She reports that she does not drink alcohol or use drugs. Social History   Social History  . Marital status: Single    Spouse name: N/A  . Number of children: 1  . Years of education: GED   Occupational History  .      NA   Social History Main Topics  . Smoking status: Never Smoker  . Smokeless tobacco: Never Used  . Alcohol use No  . Drug use: No  .  Sexual activity: Not Currently    Birth control/ protection: Post-menopausal   Other Topics Concern  . Not on file   Social History Narrative   Lives with daughter   Caffeine -coffee occas    Functional Status Survey:    Family History  Problem Relation Age of Onset  . Heart attack Mother   . Heart disease Mother        CAD  . Heart disease Brother   . Heart disease Brother   . Hypertension Father   . Stroke Father     Health Maintenance  Topic Date Due  . INFLUENZA VACCINE  11/20/2016 (Originally 08/19/2016)  . COLONOSCOPY  11/20/2016 (Originally 11/02/1995)   . TETANUS/TDAP  11/20/2016 (Originally 11/01/1964)  . Hepatitis C Screening  11/20/2016 (Originally 1945/07/10)  . PNA vac Low Risk Adult (1 of 2 - PCV13) 11/20/2016 (Originally 11/02/2010)  . MAMMOGRAM  04/21/2018  . DEXA SCAN  Completed    Allergies  Allergen Reactions  . Vicodin [Hydrocodone-Acetaminophen] Other (See Comments)    Upset stomach    Outpatient Encounter Prescriptions as of 10/20/2016  Medication Sig  . ALPRAZolam (XANAX) 0.5 MG tablet Take 1 tablet (0.5 mg total) by mouth at bedtime as needed for sleep.  Marland Kitchen amLODipine (NORVASC) 5 MG tablet Take 5 mg by mouth daily.   Marland Kitchen aspirin EC 325 MG EC tablet Take 1 tablet (325 mg total) by mouth daily.  . bisacodyl (DULCOLAX) 10 MG suppository Place 1 suppository (10 mg total) rectally daily as needed for moderate constipation.  . cholecalciferol (VITAMIN D) 1000 units tablet Take 1 tablet (1,000 Units total) by mouth daily.  Marland Kitchen docusate sodium (COLACE) 100 MG capsule Take 1 capsule (100 mg total) by mouth 2 (two) times daily.  Marland Kitchen escitalopram (LEXAPRO) 10 MG tablet Take 1 tablet (10 mg total) by mouth daily.  Marland Kitchen levothyroxine (SYNTHROID, LEVOTHROID) 50 MCG tablet Take 50 mcg by mouth daily.  . OMEGA 3-6-9 FATTY ACIDS PO Take 1 tablet by mouth daily.  Marland Kitchen oxyCODONE (OXY IR/ROXICODONE) 5 MG immediate release tablet Take 1 tablet (5 mg total) by mouth every 6 (six) hours as needed for severe pain ((for MODERATE breakthrough pain)).  . simvastatin (ZOCOR) 20 MG tablet Take 20 mg by mouth at bedtime.  . tamsulosin (FLOMAX) 0.4 MG CAPS capsule Take 1 capsule (0.4 mg total) by mouth daily.  . vitamin B-12 (CYANOCOBALAMIN) 100 MCG tablet Take 100 mcg by mouth daily.   No facility-administered encounter medications on file as of 10/20/2016.      Review of Systems  Review of Systems  Constitutional: Negative for activity change, appetite change, chills, diaphoresis, fatigue and fever.  HENT: Negative for mouth sores, postnasal drip,  rhinorrhea, sinus pain and sore throat.   Respiratory: Negative for apnea, cough, chest tightness, shortness of breath and wheezing.   Cardiovascular: Negative for chest pain, palpitations and leg swelling.  Gastrointestinal: Negative for abdominal distention, abdominal pain, Positive for  constipation, diarrhea, nausea and vomiting.  Genitourinary: Negative for dysuria and Positive for  frequency.  Musculoskeletal: Negative for arthralgias, joint swelling and myalgias.  Skin: Negative for rash.  Neurological: Negative for dizziness, syncope, weakness, light-headedness and numbness.  Psychiatric/Behavioral: Negative for behavioral problems, confusion and sleep disturbance.     Vitals:   10/20/16 0944  BP: 137/71  Pulse: 70  Resp: 18  Temp: 97.8 F (36.6 C)  TempSrc: Oral  SpO2: 96%   There is no height or weight on file to calculate BMI. Physical  Exam  Constitutional: She is oriented to person, place, and time. She appears well-developed and well-nourished.  HENT:  Head: Normocephalic.  Mouth/Throat: Oropharynx is clear and moist.  Eyes: Pupils are equal, round, and reactive to light.  Neck: Neck supple.  Cardiovascular: Normal rate, regular rhythm and normal heart sounds.   No murmur heard. Pulmonary/Chest: Breath sounds normal. No respiratory distress. She has no wheezes. She has no rales.  Abdominal: Soft. Bowel sounds are normal. She exhibits no distension. There is no tenderness. There is no rebound.  Musculoskeletal: She exhibits no edema.  Neurological: She is alert and oriented to person, place, and time.  Has Weakness in Left UE and LE. More profound tremor in Left side. He speech is very slow and Hoarse.  Skin: Skin is warm and dry.  Psychiatric: She has a normal mood and affect. Her behavior is normal. Thought content normal.    Labs reviewed: Basic Metabolic Panel:  Recent Labs  16/10/96 1425 10/16/16 0638 10/17/16 0555  NA 137 139 137  K 3.9 4.0 4.0    CL 104 104 104  CO2 GLUCOSE 108* 115* 95  BUN CREATININE 0.78 0.68 0.74  CALCIUM 10.1 9.8 9.1   Liver Function Tests:  Recent Labs  05/09/16 0708 06/20/16 1147 08/05/16 0823  AST ALT ALKPHOS 67 66 57  BILITOT 0.7 0.9 0.8  PROT 7.3 7.5 7.2  ALBUMIN 4.4 4.5 4.6    Recent Labs  10/29/15 1254 08/05/16 0823  LIPASE 19 35   No results for input(s): AMMONIA in the last 8760 hours. CBC:  Recent Labs  08/05/16 0823 10/15/16 1425  10/17/16 0555 10/18/16 0611 10/19/16 0649  WBC 4.9 8.3  < > 7.1 7.3 6.3  NEUTROABS 3.7 7.2  --   --   --   --   HGB 13.8 13.2  < > 10.4* 10.3* 8.9*  HCT 39.9 39.1  < > 30.3* 29.6* 26.1*  MCV 89.7 91.6  < > 91.5 90.8 91.3  PLT 270 236  < > 194 213 183  < > = values in this interval not displayed. Cardiac Enzymes: No results for input(s): CKTOTAL, CKMB, CKMBINDEX, TROPONINI in the last 8760 hours. BNP: Invalid input(s): POCBNP No results found for: HGBA1C No results found for: TSH No results found for: VITAMINB12 No results found for: FOLATE No results found for: IRON, TIBC, FERRITIN  Imaging and Procedures obtained prior to SNF admission: No results found.  Assessment/Plan  S/P  Hemiarthroplasty of left hip Patient doing well. On Aspirin For DVT prophylaxis. Pain Controlled on Oxycodone PRN Will start Therapy. Follow up with Ortho Patient says she wants to go home when able to walk with the walker.  Parkinson's disease  Restart her Sinemet TID.  Hypothyroidism,  Check TSH. Continue same dose for now.  Essential hypertension BP Controlled on Norvasc  Acute urinary retention Patient doing well. Check UA and discontinue Flomax  Constipation She wants to try Miralax.  Hyperlipidemia, Continue Simavastatin  Depression with anxiety Continue on Lexapro and Xanax PRN  Anemia Post op. Start her on Iron. Follow Hgb  Recurrent Falls Most likely due to Parkinson Disease. Will  continue therapy.   Family/ staff Communication:   Labs/tests ordered:  BMP, CBC, UA , TSH  Total time spent in this patient care encounter was _45 minutes; greater than 50% of the visit spent counseling patient, reviewing records , Labs and coordinating  care for problems addressed at this encounter.

## 2016-10-21 ENCOUNTER — Other Ambulatory Visit (HOSPITAL_COMMUNITY)
Admission: RE | Admit: 2016-10-21 | Discharge: 2016-10-21 | Disposition: A | Discharge status: Home / Self Care | Payer: Medicare HMO | Source: Skilled Nursing Facility | Attending: Internal Medicine | Admitting: Internal Medicine

## 2016-10-21 DIAGNOSIS — N390 Urinary tract infection, site not specified: Secondary | ICD-10-CM | POA: Insufficient documentation

## 2016-10-21 LAB — URINALYSIS, COMPLETE (UACMP) WITH MICROSCOPIC
BILIRUBIN URINE: NEGATIVE
Glucose, UA: NEGATIVE mg/dL
KETONES UR: 5 mg/dL — AB
NITRITE: POSITIVE — AB
PH: 7 (ref 5.0–8.0)
PROTEIN: NEGATIVE mg/dL
Specific Gravity, Urine: 1.012 (ref 1.005–1.030)

## 2016-10-25 LAB — URINE CULTURE: Culture: >=100000 — AB

## 2016-10-26 ENCOUNTER — Ambulatory Visit (INDEPENDENT_AMBULATORY_CARE_PROVIDER_SITE_OTHER): Payer: Self-pay | Admitting: Orthopedic Surgery

## 2016-10-26 VITALS — BP 110/63 | HR 75 | Ht 61.5 in | Wt 98.0 lb

## 2016-10-26 DIAGNOSIS — Z4889 Encounter for other specified surgical aftercare: Secondary | ICD-10-CM

## 2016-10-26 NOTE — Progress Notes (Signed)
This is a postoperative visit  This is postop day 12 status post left bipolar hip replacement for left femoral neck fracture. The patient is 71 years old she is at the [facility] she is full weightbearing as tolerated direct lateral approach. She is concerned about her ambulatory status at this time and she is having difficulty standing secondary to balance issues which are most likely related to her Parkinson's disease  Her leg lengths are equal she has 120 of hip flexion  Her staples can come out in 4 days and we will arrange a four-week follow-up  Encounter Diagnosis  Name Primary?  Marland Kitchen Aftercare following surgery Yes

## 2016-10-27 ENCOUNTER — Other Ambulatory Visit: Payer: Self-pay

## 2016-10-27 ENCOUNTER — Encounter (HOSPITAL_COMMUNITY)
Admission: RE | Admit: 2016-10-27 | Discharge: 2016-10-27 | Disposition: A | Discharge status: Home / Self Care | Payer: Medicare HMO | Source: Skilled Nursing Facility | Attending: Internal Medicine | Admitting: Internal Medicine

## 2016-10-27 DIAGNOSIS — Z9181 History of falling: Secondary | ICD-10-CM | POA: Insufficient documentation

## 2016-10-27 DIAGNOSIS — S72002D Fracture of unspecified part of neck of left femur, subsequent encounter for closed fracture with routine healing: Secondary | ICD-10-CM | POA: Insufficient documentation

## 2016-10-27 DIAGNOSIS — Z4789 Encounter for other orthopedic aftercare: Secondary | ICD-10-CM | POA: Insufficient documentation

## 2016-10-27 DIAGNOSIS — I10 Essential (primary) hypertension: Secondary | ICD-10-CM | POA: Insufficient documentation

## 2016-10-27 DIAGNOSIS — E039 Hypothyroidism, unspecified: Secondary | ICD-10-CM | POA: Insufficient documentation

## 2016-10-27 LAB — COMPREHENSIVE METABOLIC PANEL
ALBUMIN: 2.9 g/dL — AB (ref 3.5–5.0)
ALT: 8 U/L — ABNORMAL LOW (ref 14–54)
ANION GAP: 8 (ref 5–15)
AST: 15 U/L (ref 15–41)
Alkaline Phosphatase: 71 U/L (ref 38–126)
BILIRUBIN TOTAL: 0.7 mg/dL (ref 0.3–1.2)
BUN: 24 mg/dL — ABNORMAL HIGH (ref 6–20)
CO2: 26 mmol/L (ref 22–32)
Calcium: 9.4 mg/dL (ref 8.9–10.3)
Chloride: 103 mmol/L (ref 101–111)
Creatinine, Ser: 0.67 mg/dL (ref 0.44–1.00)
GFR calc Af Amer: >60 mL/min (ref >60)
GFR calc non Af Amer: >60 mL/min (ref >60)
GLUCOSE: 89 mg/dL (ref 65–99)
POTASSIUM: 3.8 mmol/L (ref 3.5–5.1)
Sodium: 137 mmol/L (ref 135–145)
TOTAL PROTEIN: 5.8 g/dL — AB (ref 6.5–8.1)

## 2016-10-27 LAB — CBC WITH DIFFERENTIAL/PLATELET
Basophils Absolute: 0 10*3/uL (ref 0.0–0.1)
Basophils Relative: 0 %
EOS PCT: 3 %
Eosinophils Absolute: 0.2 10*3/uL (ref 0.0–0.7)
HEMATOCRIT: 29.4 % — AB (ref 36.0–46.0)
HEMOGLOBIN: 9.9 g/dL — AB (ref 12.0–15.0)
LYMPHS ABS: 0.8 10*3/uL (ref 0.7–4.0)
LYMPHS PCT: 12 %
MCH: 31.4 pg (ref 26.0–34.0)
MCHC: 33.7 g/dL (ref 30.0–36.0)
MCV: 93.3 fL (ref 78.0–100.0)
Monocytes Absolute: 0.6 10*3/uL (ref 0.1–1.0)
Monocytes Relative: 9 %
Neutro Abs: 5.4 10*3/uL (ref 1.7–7.7)
Neutrophils Relative %: 76 %
PLATELETS: 459 10*3/uL — AB (ref 150–400)
RBC: 3.15 MIL/uL — AB (ref 3.87–5.11)
RDW: 13.2 % (ref 11.5–15.5)
WBC: 7 10*3/uL (ref 4.0–10.5)

## 2016-10-27 LAB — TSH: TSH: 2.944 u[IU]/mL (ref 0.350–4.500)

## 2016-10-27 MED ORDER — OXYCODONE HCL 5 MG PO TABS: 5.0000 mg | ORAL_TABLET | Freq: Four times a day (QID) | ORAL | 0 refills | Status: DC | PRN

## 2016-10-27 NOTE — Telephone Encounter (Signed)
RX Fax for [facility]@ [phone]  

## 2016-11-02 ENCOUNTER — Ambulatory Visit: Payer: Medicare HMO | Admitting: Diagnostic Neuroimaging

## 2016-11-20 ENCOUNTER — Non-Acute Institutional Stay (SKILLED_NURSING_FACILITY): Payer: Medicare HMO | Admitting: Internal Medicine

## 2016-11-20 ENCOUNTER — Encounter: Payer: Self-pay | Admitting: Internal Medicine

## 2016-11-20 DIAGNOSIS — S72002A Fracture of unspecified part of neck of left femur, initial encounter for closed fracture: Secondary | ICD-10-CM | POA: Diagnosis not present

## 2016-11-20 DIAGNOSIS — I10 Essential (primary) hypertension: Secondary | ICD-10-CM

## 2016-11-20 DIAGNOSIS — I82432 Acute embolism and thrombosis of left popliteal vein: Secondary | ICD-10-CM | POA: Diagnosis not present

## 2016-11-20 DIAGNOSIS — E039 Hypothyroidism, unspecified: Secondary | ICD-10-CM

## 2016-11-20 DIAGNOSIS — G20 Parkinson's disease: Secondary | ICD-10-CM | POA: Diagnosis not present

## 2016-11-20 NOTE — Progress Notes (Signed)
Location:   [facility] Room Number: 131/P Place of Service:  SNF (31) Provider:  Berton Lan, [facility]  Patient Care Team: [facility] as PCP - General (Family Medicine)  Extended Emergency Contact Information Primary Emergency Contact: [facility] Address: [address]          [place], [place] [address] [place] Home Phone: [phone] Mobile Phone: [phone] Relation: Daughter  Code Status:  Full Code Goals of care: Advanced Directive information Advanced Directives 11/20/2016  Does Patient Have a Medical Advance Directive? Yes  Type of Advance Directive (No Data)  Does patient want to make changes to medical advance directive? No - Patient declined  Would patient like information on creating a medical advance directive? No - Patient declined  Pre-existing out of facility DNR order (yellow form or pink MOST form) -     Chief complaint--acute visit secondary to planned discharge  HPI:  Pt is a 71 y.o. female seen today for an acute visit for discharge from facility tomorrow.   She has been here for rehabilitation after sustaining a left hip fracture that iwas surgically repaired--. She also has a history of hypertensionParkinson's diseasehyperlipidemia as well as hypothyroidism and depression.  Postop course was complicated by urinary retention  She was started on Flomax  which has since been discontinued apparently this has resolved  's also been started on iron for postoperative anemia last hemoglobin show stability at 9.9.  .  She does have a history of Parkinson's disease which has complicated her rehabilitation somewhat she continues to still be quite weak- continues on Sinemet  She will be going home with her daughter -she will need continued PT and OT she is receiving oxycodone as needed for pain.          Past Medical History:  Diagnosis Date  . Abnormal pap   .  Anxiety   . DDD (degenerative disc disease), lumbar   . Hemorrhoids   . HPV test positive 05/17/2013  . Hyperlipidemia   . Hypertension   . Hypothyroidism   . Parkinson's disease (HCC)   . Thyroid disease   . Vaginal Pap smear, abnormal    Past Surgical History:  Procedure Laterality Date  . BACK SURGERY  10/2015   fusion L4-L 5, Dr Lovell Sheehan  . CATARACT EXTRACTION W/PHACO Left 05/16/2012   Procedure: CATARACT EXTRACTION PHACO AND INTRAOCULAR LENS PLACEMENT (IOC);  Surgeon: Gemma Payor, MD;  Location: [facility];  Service: Ophthalmology;  Laterality: Left;  CDE:15.20  . COLONOSCOPY  2009   [facility]-Dr. Karilyn Cota  . EYE SURGERY Bilateral 14,15   cataracts  . Fatty Tissue Excision Left 2008   [facility]-Dr. Hilda Lias arm  . HIP ARTHROPLASTY Left 10/16/2016   Procedure: ARTHROPLASTY BIPOLAR HIP (HEMIARTHROPLASTY);  Surgeon: Vickki Hearing, MD;  Location: [facility];  Service: Orthopedics;  Laterality: Left;  . right ankle Right    fx    Allergies  Allergen Reactions  . Vicodin [Hydrocodone-Acetaminophen] Other (See Comments)    Upset stomach    Outpatient Encounter Prescriptions as of 11/20/2016  Medication Sig  . amLODipine (NORVASC) 5 MG tablet Take 5 mg by mouth daily.   Marland Kitchen aspirin EC 81 MG tablet Take 81 mg by mouth daily.  . bisacodyl (DULCOLAX) 10 MG suppository Place 1 suppository (10 mg total) rectally daily as needed for moderate constipation.  . carbidopa-levodopa (SINEMET IR) 25-100 MG tablet Take 1 tablet by mouth 3 (three) times daily.  Marland Kitchen  cholecalciferol (VITAMIN D) 1000 units tablet Take 1 tablet (1,000 Units total) by mouth daily.  Marland Kitchen docusate sodium (COLACE) 100 MG capsule Take 1 capsule (100 mg total) by mouth 2 (two) times daily.  Marland Kitchen escitalopram (LEXAPRO) 10 MG tablet Take 1 tablet (10 mg total) by mouth daily.  . Ferrous Sulfate (IRON) 325 (65 Fe) MG TABS Take 1 tablet by mouth once a day  . levothyroxine (SYNTHROID, LEVOTHROID) 50 MCG tablet Take 50 mcg by mouth daily.  . OMEGA  3-6-9 FATTY ACIDS PO Take 1 tablet by mouth daily.  Marland Kitchen oxyCODONE (OXY IR/ROXICODONE) 5 MG immediate release tablet Take 1 tablet (5 mg total) by mouth every 6 (six) hours as needed for severe pain ((for MODERATE breakthrough pain)).  . polyethylene glycol (MIRALAX / GLYCOLAX) packet Take 17 g by mouth daily.  . simvastatin (ZOCOR) 20 MG tablet Take 20 mg by mouth at bedtime.  . vitamin B-12 (CYANOCOBALAMIN) 100 MCG tablet Take 100 mcg by mouth daily.  . [DISCONTINUED] ALPRAZolam (XANAX) 0.5 MG tablet Take 1 tablet (0.5 mg total) by mouth at bedtime as needed for sleep.  . [DISCONTINUED] aspirin EC 325 MG EC tablet Take 1 tablet (325 mg total) by mouth daily.  . [DISCONTINUED] tamsulosin (FLOMAX) 0.4 MG CAPS capsule Take 1 capsule (0.4 mg total) by mouth daily.   No facility-administered encounter medications on file as of 11/20/2016.     Review of Systems   In general she does not complaining any fever or chills says she still feels weak.  Skin does not complain of rashes or itching.  Head ears eyes nose mouth and throat does not complain of visual changes or sore throat.  Respiratory denies shortness breath or cough.  Cardiac denies chest pain does have some increased lower extremity edema left leg.  gI does not complaining of abdominal paig diarrhea or constipation.  Muscle skeletal still complains at times of some hip pain oxycodone apparently does help.  Neurologic is not complaining of dizziness headache or syncope or numbness.  Psych does not complain of overt depression or anxiety does have somewhat of a subdued flat affect I suspect Parkinson's is contributing to this.   There is no immunization history on file for this patient. Pertinent  Health Maintenance Due  Topic Date Due  . INFLUENZA VACCINE  11/20/2016 (Originally 08/19/2016)  . COLONOSCOPY  11/20/2016 (Originally 11/02/1995)  . PNA vac Low Risk Adult (1 of 2 - PCV13) 11/20/2016 (Originally 11/02/2010)  . MAMMOGRAM   04/21/2018  . DEXA SCAN  Completed   Fall Risk  05/19/2016  Falls in the past year? No   Functional Status Survey:    Temperature is 97.8 pulse 80 respirations 18 blood pressure 104/64 weight is 97.4  Physical Exam   In general this is a pleasant elderly female in no distress sitting comfortably in her wheelchair.  Her skin is warm and dry.  Eyes visual acuity appears grossly intact sclera and conjunctiva are clear.  Oropharynx is clear mucous membranes moist.  Chest is clear to auscultation there is no labored breathing.  Heart is regular rate and rhythm without murmur gallop or rub she does have some increased edema of her left leg edema is cool flesh-colored pedal pulse.  Reduced  Abdomen is soft nontender with positive bowel sounds.  Muscle skeletal is able to move all extremities 4 is able to stand without assistance and use walker appears  To be weak however.  Neurologic-has a small tremor left side speech  continues to be somewhat slow and hoarse  Psych she is alert and oriented pleasant and appropriate  Labs reviewed:  Recent Labs  10/16/16 0638 10/17/16 0555 10/27/16 0700  NA 139 137 137  K 4.0 4.0 3.8  CL 104 104 103  CO2 27 26 26   GLUCOSE 115* 95 89  BUN 16 15 24*  CREATININE 0.68 0.74 0.67  CALCIUM 9.8 9.1 9.4    Recent Labs  06/20/16 1147 08/05/16 0823 10/27/16 0700  AST 19 20 15   ALT 17 14 8*  ALKPHOS 66 57 71  BILITOT 0.9 0.8 0.7  PROT 7.5 7.2 5.8*  ALBUMIN 4.5 4.6 2.9*    Recent Labs  08/05/16 0823 10/15/16 1425  10/18/16 0611 10/19/16 0649 10/27/16 0700  WBC 4.9 8.3  < > 7.3 6.3 7.0  NEUTROABS 3.7 7.2  --   --   --  5.4  HGB 13.8 13.2  < > 10.3* 8.9* 9.9*  HCT 39.9 39.1  < > 29.6* 26.1* 29.4*  MCV 89.7 91.6  < > 90.8 91.3 93.3  PLT 270 236  < > 213 183 459*  < > = values in this interval not displayed. Lab Results  Component Value Date   TSH 2.944 10/27/2016   No results found for: HGBA1C No results found for: CHOL, HDL,  LDLCALC, LDLDIRECT, TRIG, CHOLHDL  Significant Diagnostic Results in last 30 days:  No results found.  Assessment/Plan  1 history of left hip fracture status post repair she is followed by orthopedics she still will need pretty extensive PT and OT upon discharge she will be at home with her daughter there are insurance issues contributing to this discharge but she certainly will need continued therapy and close monitoring  She is on aspirin for DVT prophylaxis she does have some increased edema of her left leg will order a venous Doppler to rule out DVT  #2history of anemia she is on iron hemoglobin appears to have stabilized at 9.9 will recheck this before discharge.  #3 hypertension she is on Norvasc this appears to be stable recent blood pressures 104/64-103/57-101/62 she does not complain of syncope or dizziness.  4 depression she continues on Lexapro this will warrant follow up by primary care provider she does not appear to be overtly depressed but does have somewhat of a flat affect.  5 history of Parkinson's she is on Sinemet again this has complicated rehabilitation somewhat-this will need follow-up by primary care provider  #6-history of hyperlipidemia she is on simvastatin since her stay here is been quite short will defer follow-up to primary care provider  #7 history of left leg edema-Will obtain a venous Doppler to rule out DVT especially in light of recent surgery I note she is on aspirin for anticoagulation  #8-history hypothyroidism she continues on Synthroid--TSH was within normal limits at 2.944 on lab done 10/27/2016.  Again she is slated to go home with her daughter she will need continued PT and OT-will await venous Doppler results as well  Also will update a CBC and BMP before discharge to ensure stability.     Addendum-date is 11/21/2016-- have obtained results of the venous Doppler--does show positive DVT involving the lower extremityon the left--  At the  popliteal vein extending distally into the tibial veins.  This was discussed with Dr. Chales Abrahams via phone and will start her on  Eliquis 10 mg by mouth twice a day for 7 days and then reduce down to5 mg by mouth twice a  dayfor approximately 3 months- I also spoke with nursing at the facility and will hold her discharge through the weekend and await evaluation on Monday for possible discharge.  Her labs look reassuring--=hemoglobin has improved up to-11.8-white count is platelets are 301,000---metabolic panel is fairly unremarkable with acreatinine of 0.81 BUN of 25 sodium of 136 potassium 4.1.  [id number]-of note greater than 40 minutes spent assessing patient-reviewing her chart-reviewing her labs-reviewing her updated labs as well as Doppler study-and  coordinating and formulating a plan of care for numerous diagnoses-of note greater than 50% of time spent coordinating plan of care including discussion with nursing as well as with Dr. Chales AbrahamsGupta via phone.

## 2016-11-21 ENCOUNTER — Ambulatory Visit (HOSPITAL_COMMUNITY): Payer: Medicare HMO | Attending: Family Medicine

## 2016-11-21 ENCOUNTER — Encounter (HOSPITAL_COMMUNITY)
Admission: RE | Admit: 2016-11-21 | Discharge: 2016-11-21 | Disposition: A | Discharge status: Home / Self Care | Payer: Medicare HMO | Source: Other Acute Inpatient Hospital | Attending: Internal Medicine | Admitting: Internal Medicine

## 2016-11-21 DIAGNOSIS — I82432 Acute embolism and thrombosis of left popliteal vein: Secondary | ICD-10-CM | POA: Diagnosis not present

## 2016-11-21 DIAGNOSIS — R600 Localized edema: Secondary | ICD-10-CM | POA: Diagnosis not present

## 2016-11-21 DIAGNOSIS — R7989 Other specified abnormal findings of blood chemistry: Secondary | ICD-10-CM | POA: Diagnosis not present

## 2016-11-21 DIAGNOSIS — R6889 Other general symptoms and signs: Secondary | ICD-10-CM | POA: Diagnosis not present

## 2016-11-21 LAB — CBC WITH DIFFERENTIAL/PLATELET
BASOS ABS: 0 10*3/uL (ref 0.0–0.1)
BASOS PCT: 0 %
EOS ABS: 0.1 10*3/uL (ref 0.0–0.7)
Eosinophils Relative: 2 %
HCT: 36.2 % (ref 36.0–46.0)
Hemoglobin: 11.8 g/dL — ABNORMAL LOW (ref 12.0–15.0)
Lymphocytes Relative: 14 %
Lymphs Abs: 0.8 10*3/uL (ref 0.7–4.0)
MCH: 31.1 pg (ref 26.0–34.0)
MCHC: 32.6 g/dL (ref 30.0–36.0)
MCV: 95.5 fL (ref 78.0–100.0)
MONO ABS: 0.5 10*3/uL (ref 0.1–1.0)
Monocytes Relative: 9 %
Neutro Abs: 4.3 10*3/uL (ref 1.7–7.7)
Neutrophils Relative %: 75 %
PLATELETS: 301 10*3/uL (ref 150–400)
RBC: 3.79 MIL/uL — AB (ref 3.87–5.11)
RDW: 13.7 % (ref 11.5–15.5)
WBC: 5.7 10*3/uL (ref 4.0–10.5)

## 2016-11-21 LAB — BASIC METABOLIC PANEL
ANION GAP: 10 (ref 5–15)
BUN: 25 mg/dL — ABNORMAL HIGH (ref 6–20)
CHLORIDE: 100 mmol/L — AB (ref 101–111)
CO2: 26 mmol/L (ref 22–32)
Calcium: 9.9 mg/dL (ref 8.9–10.3)
Creatinine, Ser: 0.81 mg/dL (ref 0.44–1.00)
GFR calc Af Amer: >60 mL/min (ref >60)
Glucose, Bld: 98 mg/dL (ref 65–99)
Potassium: 4.1 mmol/L (ref 3.5–5.1)
Sodium: 136 mmol/L (ref 135–145)

## 2016-11-22 ENCOUNTER — Encounter: Payer: Self-pay | Admitting: Internal Medicine

## 2016-11-22 DIAGNOSIS — I82402 Acute embolism and thrombosis of unspecified deep veins of left lower extremity: Secondary | ICD-10-CM | POA: Insufficient documentation

## 2016-11-23 ENCOUNTER — Encounter: Payer: Self-pay | Admitting: Internal Medicine

## 2016-11-23 ENCOUNTER — Telehealth: Payer: Self-pay | Admitting: Radiology

## 2016-11-23 ENCOUNTER — Ambulatory Visit: Payer: Medicare HMO | Admitting: Orthopedic Surgery

## 2016-11-23 ENCOUNTER — Telehealth: Payer: Self-pay | Admitting: Orthopedic Surgery

## 2016-11-23 ENCOUNTER — Non-Acute Institutional Stay (SKILLED_NURSING_FACILITY): Payer: Medicare HMO | Admitting: Internal Medicine

## 2016-11-23 DIAGNOSIS — I82432 Acute embolism and thrombosis of left popliteal vein: Secondary | ICD-10-CM | POA: Diagnosis not present

## 2016-11-23 DIAGNOSIS — I10 Essential (primary) hypertension: Secondary | ICD-10-CM | POA: Diagnosis not present

## 2016-11-23 DIAGNOSIS — G20 Parkinson's disease: Secondary | ICD-10-CM | POA: Diagnosis not present

## 2016-11-23 DIAGNOSIS — S72002A Fracture of unspecified part of neck of left femur, initial encounter for closed fracture: Secondary | ICD-10-CM | POA: Diagnosis not present

## 2016-11-23 NOTE — Telephone Encounter (Signed)
Call from patient's daughter - aware of today's appointment, and states that [facility] facility physician ordered a CT scan over weekend, 11/21/16, and that result "is a blood clot" - states [facility] therefore will not be transporting patient here today. Please review and advise.  Patient is in [address] at [facility] [phone], [place]. Patient's daughter, Rodolph Bongeresa Dockery ph# [phone], is also awaiting further advice from Dr Romeo AppleHarrison.

## 2016-11-23 NOTE — Progress Notes (Signed)
Location:   [facility] Room Number: 131/P Place of Service:  SNF (31)  Provider: Edmon Crape  PCP: [facility] Patient Care Team: Pllc, [facility] as PCP - General (Family Medicine)  Extended Emergency Contact Information Primary Emergency Contact: Dockery,Teresa Address: [address]          [place], [place] [address] [place] Home Phone: [phone] Mobile Phone: [phone] Relation: Daughter  Code Status: Full Code Goals of care:  Advanced Directive information Advanced Directives 11/23/2016  Does Patient Have a Medical Advance Directive? Yes  Type of Advance Directive (No Data)  Does patient want to make changes to medical advance directive? No - Patient declined  Would patient like information on creating a medical advance directive? No - Patient declined  Pre-existing out of facility DNR order (yellow form or pink MOST form) -     Allergies  Allergen Reactions  . Vicodin [Hydrocodone-Acetaminophen] Other (See Comments)    Upset stomach    Chief Complaint  Patient presents with  . Discharge Note    Discharge Visit    HPI:  71 y.o. female  Seen today for discharge from facility.  She recently was slated to be discharged last over the weekend-however when I assessed her Friday did note her left leg appeared to have some increased edema-venous Doppler was ordered and did show  A DVT in the left leg at the popliteal vein extending into the tibial veins distally.  She does have a history of a previous left hip fracture which was surgically repaired --awas on aspirin for anticoagulation  Patient did stay in the facility over the weekend for monitoring  She is being discharged today  She has been started on  Eliquis she will be on 10 mg twice a day for 7 days and then reduce down to 5 mg twice a day   for approximately 3 months  She will be with her daughter-she continues to have some weakness but  her insurance has run out as of Friday--appeal  apparently has been denied  She does have a history of Parkinson's disease which I suspect contributes to her weakness she is on Sinemet.    She did have postop anemia and she was started on iron this appears to be helping her hemoglobin is now up to 11.8 on lab done on November 3 Her vital signs continued to be   Stable--her main complaint is some continued left leg discomfortshe does receive oxycodone when necessary  She did have orthopedic follow-up scheduled for today but that was postponed--nursing staff is contacting orthopedics today for follow-up   .             Past Medical History:  Diagnosis Date  . Abnormal pap   . Anxiety   . DDD (degenerative disc disease), lumbar   . Hemorrhoids   . HPV test positive 05/17/2013  . Hyperlipidemia   . Hypertension   . Hypothyroidism   . Parkinson's disease (HCC)   . Thyroid disease   . Vaginal Pap smear, abnormal     Past Surgical History:  Procedure Laterality Date  . BACK SURGERY  10/2015   fusion L4-L 5, Dr Lovell Sheehan  . COLONOSCOPY  2009   [facility]-Dr. Karilyn Cota  . EYE SURGERY Bilateral 14,15   cataracts  . Fatty Tissue Excision Left 2008   [facility]-Dr. Hilda Lias arm  . right ankle Right    fx      reports that  has never  smoked. she has never used smokeless tobacco. She reports that she does not drink alcohol or use drugs. Social History   Socioeconomic History  . Marital status: Single    Spouse name: Not on file  . Number of children: 1  . Years of education: GED  . Highest education level: Not on file  Social Needs  . Financial resource strain: Not on file  . Food insecurity - worry: Not on file  . Food insecurity - inability: Not on file  . Transportation needs - medical: Not on file  . Transportation needs - non-medical: Not on file  Occupational History    Comment: NA  Tobacco Use  . Smoking status: Never Smoker  . Smokeless tobacco: Never Used  Substance  and Sexual Activity  . Alcohol use: No  . Drug use: No  . Sexual activity: Not Currently    Birth control/protection: Post-menopausal  Other Topics Concern  . Not on file  Social History Narrative   Lives with daughter   Caffeine -coffee occas   Functional Status Survey:    Allergies  Allergen Reactions  . Vicodin [Hydrocodone-Acetaminophen] Other (See Comments)    Upset stomach    Pertinent  Health Maintenance Due  Topic Date Due  . INFLUENZA VACCINE  12/19/2016 (Originally 08/19/2016)  . COLONOSCOPY  12/19/2016 (Originally 11/02/1995)  . PNA vac Low Risk Adult (1 of 2 - PCV13) 12/19/2016 (Originally 11/02/2010)  . MAMMOGRAM  04/21/2018  . DEXA SCAN  Completed    Medications: Outpatient Encounter Medications as of 11/23/2016  Medication Sig  . amLODipine (NORVASC) 5 MG tablet Take 5 mg by mouth daily.   Marland Kitchen apixaban (ELIQUIS) 5 MG TABS tablet Take 2 tablets by mouth to = 10 mg for 7 days from 11/21/2016-11/27/2016 then take 1 tablet 5 mg by mouth stating on 11/28/2016  . aspirin EC 81 MG tablet Take 81 mg by mouth daily.  . bisacodyl (DULCOLAX) 10 MG suppository Place 1 suppository (10 mg total) rectally daily as needed for moderate constipation.  . carbidopa-levodopa (SINEMET IR) 25-100 MG tablet Take 1 tablet by mouth 3 (three) times daily.  . cholecalciferol (VITAMIN D) 1000 units tablet Take 1 tablet (1,000 Units total) by mouth daily.  Marland Kitchen docusate sodium (COLACE) 100 MG capsule Take 1 capsule (100 mg total) by mouth 2 (two) times daily.  Marland Kitchen escitalopram (LEXAPRO) 10 MG tablet Take 1 tablet (10 mg total) by mouth daily.  . Ferrous Sulfate (IRON) 325 (65 Fe) MG TABS Take 1 tablet by mouth once a day  . levothyroxine (SYNTHROID, LEVOTHROID) 50 MCG tablet Take 50 mcg by mouth daily.  . OMEGA 3-6-9 FATTY ACIDS PO Take 1 tablet by mouth daily.  Marland Kitchen oxyCODONE (OXY IR/ROXICODONE) 5 MG immediate release tablet Take 1 tablet (5 mg total) by mouth every 6 (six) hours as needed for severe  pain ((for MODERATE breakthrough pain)).  . polyethylene glycol (MIRALAX / GLYCOLAX) packet Take 17 g by mouth daily.  . simvastatin (ZOCOR) 20 MG tablet Take 20 mg by mouth at bedtime.  . vitamin B-12 (CYANOCOBALAMIN) 100 MCG tablet Take 100 mcg by mouth daily.   No facility-administered encounter medications on file as of 11/23/2016.      Review of Systems   In general she is not complaining of any fever or chills continues to complain of weakness.  Skin does not complain of rashes itching or diaphoresis.  Head ears eyes nose mouth and throat does not complaining of sore throat or visual  changes.  Respiratory does not complain of cough or shortness of breath.  Cardiac does not complain of chest pain continues to have some left leg edema.  GI is not complaining of nausea vomiting diarrhea constipation or abdominal discomfort at this time.  Muscle skeletal does complain of some left leg discomfort.  Neurologic does not complain of dizziness headache or numbness or syncope.  Psych has somewhat of a flat affect but does not complain of overt anxiety or depression    Vitals:   11/23/16 1407  BP: 120/66  Pulse: 66  Resp: 18  Temp: 98.4 F (36.9 C)  TempSrc: Oral  Weight is 95 pounds  Physical Exam   In general this is a pleasant elderly female in no distress sitting comfortably in her wheelchair.  Her skin is warm and dry.  Oropharynx is clear mucous membranes moist.  Chest is clear to auscultation there is no labored breathing.  Heart is regular rate and rhythm without murmur gallop or rub she has some continued edema of her left leg pedal pulse is somewhat reduced-capillary refill is intact-the edema is flesh-colored cooldoes not appear to be acutely tender.  Abdomen is soft nontender with positive bowel sounds.  Musculoskeletal continues with lower extremity weakness per discussion with nursing she is ambulatory with a walker-upper extremity strength appears  preserved she does have a history of an intermittent left side tremor but I do not really note that today.  Neurologic as noted above--cranial nerves are grossly intact she does have somewhat of a slowed speech in soft voice volume  psych-she is alert and oriented pleasant and appropriate   Labs reviewed: Basic Metabolic Panel: Recent Labs    10/17/16 0555 10/27/16 0700 11/21/16 1115  NA 137 137 136  K 4.0 3.8 4.1  CL 104 103 100*  CO2 26 26 26   GLUCOSE 95 89 98  BUN 15 24* 25*  CREATININE 0.74 0.67 0.81  CALCIUM 9.1 9.4 9.9   Liver Function Tests: Recent Labs    06/20/16 1147 08/05/16 0823 10/27/16 0700  AST 19 20 15   ALT 17 14 8*  ALKPHOS 66 57 71  BILITOT 0.9 0.8 0.7  PROT 7.5 7.2 5.8*  ALBUMIN 4.5 4.6 2.9*   Recent Labs    08/05/16 0823  LIPASE 35   No results for input(s): AMMONIA in the last 8760 hours. CBC: Recent Labs    10/15/16 1425  10/19/16 0649 10/27/16 0700 11/21/16 1115  WBC 8.3   < > 6.3 7.0 5.7  NEUTROABS 7.2  --   --  5.4 4.3  HGB 13.2   < > 8.9* 9.9* 11.8*  HCT 39.1   < > 26.1* 29.4* 36.2  MCV 91.6   < > 91.3 93.3 95.5  PLT 236   < > 183 459* 301   < > = values in this interval not displayed.   Cardiac Enzymes: No results for input(s): CKTOTAL, CKMB, CKMBINDEX, TROPONINI in the last 8760 hours. BNP: Invalid input(s): POCBNP CBG: No results for input(s): GLUCAP in the last 8760 hours.  Procedures and Imaging Studies During Stay: US Venous Img Lower Unilateral Left  Result Date: 11/21/2016 CLINICAL DATA:  71 year old female with a history of lower extremity edema EXAM: LEFT LOWER EXTREMITY VENOUS DOPPLER ULTRASOUND TECHNIQUE: Gray-scale sonography with graded compression, as well as color Doppler and duplex ultrasound were performed to evaluate the lower extremity deep venous systems from the level of the common femoral vein and including the common femoral, femoral, profunda  femoral, popliteal and calf veins including the posterior  tibial, peroneal and gastrocnemius veins when visible. The superficial great saphenous vein was also interrogated. Spectral Doppler was utilized to evaluate flow at rest and with distal augmentation maneuvers in the common femoral, femoral and popliteal veins. COMPARISON:  None. FINDINGS: Contralateral Common Femoral Vein: Respiratory phasicity is normal and symmetric with the symptomatic side. No evidence of thrombus. Normal compressibility. Common Femoral Vein: No evidence of thrombus. Normal compressibility, respiratory phasicity and response to augmentation. Saphenofemoral Junction: No evidence of thrombus. Normal compressibility and flow on color Doppler imaging. Profunda Femoral Vein: No evidence of thrombus. Normal compressibility and flow on color Doppler imaging. Femoral Vein: No evidence of thrombus. Normal compressibility, respiratory phasicity and response to augmentation. Popliteal Vein: Occlusive thrombus of the popliteal vein with noncompressibility, no flow signal, with thrombus extending into the tibial veins. Superficial Great Saphenous Vein: No evidence of thrombus. Normal compressibility and flow on color Doppler imaging. Other Findings:  None. IMPRESSION: Sonographic survey positive for DVT involving the left lower extremity at the popliteal vein, extending distally into the tibial veins. These results were called by telephone at the time of interpretation on 11/21/2016 at 10:06 am to charge nurse caring for the patient Ms Jeri LagerJamie Nieves, who verbally acknowledged these results. Electronically Signed   By: Gilmer MorJaime  Wagner D.O.   On: 11/21/2016 10:06    Assessment/Plan:  History of left hip fracture status post repair-now with a left lower extremity  DVT as noted above    Again she has been started on Eliquis 10 mg twice a day for one week and then down to 5 mg twice a day for proximally 3 months-.  We'll discontinue aspirin-she will need expedient orthopedic follow-up-nursing staff is  contacting orthopedics for follow-up-orthopedics is aware of the DVT-nursing will also make them aware of the continued left leg discomfort-she currently receives oxycodone as needed as well as Robaxin when necessary.  She will need extensive PT and OT at home she will be going home with her daughter--also will need home health for her multiple medical issues   again insurance coverage has run out for her stay in skilled nursing which has contributed to this discharge    #2 history of anemia again this is shown improvement she is on iron hemoglobin is now 11.8 will defer up date to primary care provider  3 hypertension this appears stable on Norvasc.  #4 history of Parkinson's she is on Sinemet again is has  Been somewhat of a challenge in her rehabilitation she again will need  PT and OT  #5-depression she is on Lexapro- this will warrant follow up by primary care provider.  .  #6 history of hyperlipidemia statin since her stay here was quite short will defer follow-up to her primary care provider.  Again she will be going home with her daughter-she willneed  PT OT as well as home health support for multiple medical issues she will need expedient follow-up by primary care provided-it is my understanding this has been arranged also will need expedient orthopedic follow-up as noted above  .  [id number] of note greater than 30 minutes spent on this discharge summary-including  Assessing patient-reviewing her chart and labs-discussing her status with nursingas well as with Dr. Chales AbrahamsGupta and coordinating plan of care as noted above9

## 2016-11-23 NOTE — Telephone Encounter (Signed)
I [facility] to [facility]rell today at [facility]. Patient is being d/c home today, the daughter Julie Bath(Teresa) appealed medicare/ [employer] to have patient stay in facility longer, but the appeal was denied. Patient is scheduled to see Dr Sherwood GamblerFusco tomorrow at 12:30 for DVT treatment and is scheduled to see us Wed Nov 7th at 10:00, since patient is having increased pain. I have called Julie Farrell to advise her of the appointments. Julie Farrell has voiced understanding regarding the appointments and has advised me she will arrange to have her mother transported to the appointments since Julie Farrell is traveling out of town.

## 2016-11-23 NOTE — Telephone Encounter (Signed)
Done, scheduled accordingly.* *Dr Romeo AppleHarrison - patient's daughter given this information, Rodolph Bongeresa Dockery ph# [phone], is also awaiting further advice from Dr Romeo AppleHarrison, as she states that [facility] today, despite the updated findings.

## 2016-11-23 NOTE — Telephone Encounter (Signed)
OK CANCEL APPT  CALL [facility] AND SET UP APPT FOR 4 WEEKS

## 2016-11-23 NOTE — Progress Notes (Signed)
Location:   [facility] Room Number: 131/P Place of Service:  SNF (31) Provider:  Nicole Cella, [facility]  Patient Care Team: Pllc, [facility] as PCP - General (Family Medicine)  Extended Emergency Contact Information Primary Emergency Contact: Dockery,Teresa Address: [address]          [place], [place] [address] [place] Home Phone: [phone] Mobile Phone: [phone] Relation: Daughter  Code Status:  Full Code Goals of care: Advanced Directive information Advanced Directives 11/20/2016  Does Patient Have a Medical Advance Directive? Yes  Type of Advance Directive (No Data)  Does patient want to make changes to medical advance directive? No - Patient declined  Would patient like information on creating a medical advance directive? No - Patient declined  Pre-existing out of facility DNR order (yellow form or pink MOST form) -     Chief Complaint  Patient presents with  . Acute Visit    F/U for DVT    HPI:  Pt is a 71 y.o. female seen today for an acute visit for    Past Medical History:  Diagnosis Date  . Abnormal pap   . Anxiety   . DDD (degenerative disc disease), lumbar   . Hemorrhoids   . HPV test positive 05/17/2013  . Hyperlipidemia   . Hypertension   . Hypothyroidism   . Parkinson's disease (HCC)   . Thyroid disease   . Vaginal Pap smear, abnormal    Past Surgical History:  Procedure Laterality Date  . BACK SURGERY  10/2015   fusion L4-L 5, Dr Lovell Sheehan  . COLONOSCOPY  2009   [facility]-Dr. Karilyn Cota  . EYE SURGERY Bilateral 14,15   cataracts  . Fatty Tissue Excision Left 2008   [facility]-Dr. Hilda Lias arm  . right ankle Right    fx    Allergies  Allergen Reactions  . Vicodin [Hydrocodone-Acetaminophen] Other (See Comments)    Upset stomach    Outpatient Encounter Medications as of 11/23/2016  Medication Sig  . amLODipine (NORVASC) 5 MG tablet Take 5 mg by mouth daily.   Marland Kitchen apixaban  (ELIQUIS) 5 MG TABS tablet Take 2 tablets by mouth to = 10 mg for 7 days from 11/21/2016-11/27/2016 then take 1 tablet 5 mg by mouth stating on 11/28/2016  . aspirin EC 81 MG tablet Take 81 mg by mouth daily.  . bisacodyl (DULCOLAX) 10 MG suppository Place 1 suppository (10 mg total) rectally daily as needed for moderate constipation.  . carbidopa-levodopa (SINEMET IR) 25-100 MG tablet Take 1 tablet by mouth 3 (three) times daily.  . cholecalciferol (VITAMIN D) 1000 units tablet Take 1 tablet (1,000 Units total) by mouth daily.  Marland Kitchen docusate sodium (COLACE) 100 MG capsule Take 1 capsule (100 mg total) by mouth 2 (two) times daily.  Marland Kitchen escitalopram (LEXAPRO) 10 MG tablet Take 1 tablet (10 mg total) by mouth daily.  . Ferrous Sulfate (IRON) 325 (65 Fe) MG TABS Take 1 tablet by mouth once a day  . levothyroxine (SYNTHROID, LEVOTHROID) 50 MCG tablet Take 50 mcg by mouth daily.  . OMEGA 3-6-9 FATTY ACIDS PO Take 1 tablet by mouth daily.  Marland Kitchen oxyCODONE (OXY IR/ROXICODONE) 5 MG immediate release tablet Take 1 tablet (5 mg total) by mouth every 6 (six) hours as needed for severe pain ((for MODERATE breakthrough pain)).  . polyethylene glycol (MIRALAX / GLYCOLAX) packet Take 17 g by mouth daily.  . simvastatin (ZOCOR) 20 MG tablet Take 20 mg by mouth at  bedtime.  . vitamin B-12 (CYANOCOBALAMIN) 100 MCG tablet Take 100 mcg by mouth daily.   No facility-administered encounter medications on file as of 11/23/2016.      Review of Systems   There is no immunization history on file for this patient. Pertinent  Health Maintenance Due  Topic Date Due  . COLONOSCOPY  11/02/1995  . PNA vac Low Risk Adult (1 of 2 - PCV13) 11/02/2010  . INFLUENZA VACCINE  08/19/2016  . MAMMOGRAM  04/21/2018  . DEXA SCAN  Completed   Fall Risk  05/19/2016  Falls in the past year? No   Functional Status Survey:    There were no vitals filed for this visit. There is no height or weight on file to calculate BMI. Physical  Exam  Labs reviewed: Recent Labs    10/17/16 0555 10/27/16 0700 11/21/16 1115  NA 137 137 136  K 4.0 3.8 4.1  CL 104 103 100*  CO2 26 26 26   GLUCOSE 95 89 98  BUN 15 24* 25*  CREATININE 0.74 0.67 0.81  CALCIUM 9.1 9.4 9.9   Recent Labs    06/20/16 1147 08/05/16 0823 10/27/16 0700  AST 19 20 15   ALT 17 14 8*  ALKPHOS 66 57 71  BILITOT 0.9 0.8 0.7  PROT 7.5 7.2 5.8*  ALBUMIN 4.5 4.6 2.9*   Recent Labs    10/15/16 1425  10/19/16 0649 10/27/16 0700 11/21/16 1115  WBC 8.3   < > 6.3 7.0 5.7  NEUTROABS 7.2  --   --  5.4 4.3  HGB 13.2   < > 8.9* 9.9* 11.8*  HCT 39.1   < > 26.1* 29.4* 36.2  MCV 91.6   < > 91.3 93.3 95.5  PLT 236   < > 183 459* 301   < > = values in this interval not displayed.   Lab Results  Component Value Date   TSH 2.944 10/27/2016   No results found for: HGBA1C No results found for: CHOL, HDL, LDLCALC, LDLDIRECT, TRIG, CHOLHDL  Significant Diagnostic Results in last 30 days:  US Venous Img Lower Unilateral Left  Result Date: 11/21/2016 CLINICAL DATA:  71 year old female with a history of lower extremity edema EXAM: LEFT LOWER EXTREMITY VENOUS DOPPLER ULTRASOUND TECHNIQUE: Gray-scale sonography with graded compression, as well as color Doppler and duplex ultrasound were performed to evaluate the lower extremity deep venous systems from the level of the common femoral vein and including the common femoral, femoral, profunda femoral, popliteal and calf veins including the posterior tibial, peroneal and gastrocnemius veins when visible. The superficial great saphenous vein was also interrogated. Spectral Doppler was utilized to evaluate flow at rest and with distal augmentation maneuvers in the common femoral, femoral and popliteal veins. COMPARISON:  None. FINDINGS: Contralateral Common Femoral Vein: Respiratory phasicity is normal and symmetric with the symptomatic side. No evidence of thrombus. Normal compressibility. Common Femoral Vein: No evidence of  thrombus. Normal compressibility, respiratory phasicity and response to augmentation. Saphenofemoral Junction: No evidence of thrombus. Normal compressibility and flow on color Doppler imaging. Profunda Femoral Vein: No evidence of thrombus. Normal compressibility and flow on color Doppler imaging. Femoral Vein: No evidence of thrombus. Normal compressibility, respiratory phasicity and response to augmentation. Popliteal Vein: Occlusive thrombus of the popliteal vein with noncompressibility, no flow signal, with thrombus extending into the tibial veins. Superficial Great Saphenous Vein: No evidence of thrombus. Normal compressibility and flow on color Doppler imaging. Other Findings:  None. IMPRESSION: Sonographic survey positive for DVT involving  the left lower extremity at the popliteal vein, extending distally into the tibial veins. These results were called by telephone at the time of interpretation on 11/21/2016 at 10:06 am to charge nurse caring for the patient Ms Jeri LagerJamie Nieves, who verbally acknowledged these results. Electronically Signed   By: Gilmer MorJaime  Wagner D.O.   On: 11/21/2016 10:06    Assessment/Plan There are no diagnoses linked to this encounter.   Family/ staff Communication:   Labs/tests ordered:    This encounter was created in error - please disregard.

## 2016-11-24 ENCOUNTER — Other Ambulatory Visit: Payer: Self-pay

## 2016-11-24 DIAGNOSIS — Z681 Body mass index (BMI) 19 or less, adult: Secondary | ICD-10-CM | POA: Diagnosis not present

## 2016-11-24 DIAGNOSIS — Z96642 Presence of left artificial hip joint: Secondary | ICD-10-CM | POA: Diagnosis not present

## 2016-11-24 DIAGNOSIS — I82409 Acute embolism and thrombosis of unspecified deep veins of unspecified lower extremity: Secondary | ICD-10-CM | POA: Diagnosis not present

## 2016-11-24 NOTE — Patient Outreach (Signed)
[facility] Care Management  11/24/2016  Julie Farrell April 27, 1945 [id number]  Transition of care  Referral date: 11/24/16 Referral source: status post skilled nursing facility [facility] Insurance: [employer] Attempt #1  Telephone call to patient regarding transition of care follow up. Contact answering phone states she is patients daughter. Contact states patient is at a doctors appointment today and request call back at another time.  PLAN: RNCM will attempt 2nd telephone follow up with patient within 5 business days.   George InaDavina Notnamed Scholz RN,BSN,CCM [facility] Telephonic  [phone]

## 2016-11-25 ENCOUNTER — Ambulatory Visit (INDEPENDENT_AMBULATORY_CARE_PROVIDER_SITE_OTHER): Payer: Medicare HMO

## 2016-11-25 ENCOUNTER — Ambulatory Visit (INDEPENDENT_AMBULATORY_CARE_PROVIDER_SITE_OTHER): Payer: Self-pay | Admitting: Orthopedic Surgery

## 2016-11-25 ENCOUNTER — Encounter: Payer: Self-pay | Admitting: Orthopedic Surgery

## 2016-11-25 VITALS — BP 113/66 | HR 79 | Ht 61.0 in | Wt 98.0 lb

## 2016-11-25 DIAGNOSIS — Z4889 Encounter for other specified surgical aftercare: Secondary | ICD-10-CM

## 2016-11-25 DIAGNOSIS — F329 Major depressive disorder, single episode, unspecified: Secondary | ICD-10-CM | POA: Diagnosis not present

## 2016-11-25 DIAGNOSIS — I82492 Acute embolism and thrombosis of other specified deep vein of left lower extremity: Secondary | ICD-10-CM

## 2016-11-25 DIAGNOSIS — I10 Essential (primary) hypertension: Secondary | ICD-10-CM | POA: Diagnosis not present

## 2016-11-25 DIAGNOSIS — S72002D Fracture of unspecified part of neck of left femur, subsequent encounter for closed fracture with routine healing: Secondary | ICD-10-CM | POA: Diagnosis not present

## 2016-11-25 DIAGNOSIS — M5136 Other intervertebral disc degeneration, lumbar region: Secondary | ICD-10-CM | POA: Diagnosis not present

## 2016-11-25 DIAGNOSIS — F411 Generalized anxiety disorder: Secondary | ICD-10-CM | POA: Diagnosis not present

## 2016-11-25 DIAGNOSIS — G20 Parkinson's disease: Secondary | ICD-10-CM | POA: Diagnosis not present

## 2016-11-25 NOTE — Progress Notes (Signed)
Chief Complaint  Patient presents with  . Follow-up    Recheck on left hip fracture, DOS 10-16-16.    Encounter Diagnoses  Name Primary?  Marland Kitchen. Aftercare following surgery Yes  . Acute deep vein thrombosis (DVT) of other specified vein of left lower extremity (HCC)     IMPRESSION: Sonographic survey positive for DVT involving the left lower extremity at the popliteal vein, extending distally into the tibial veins.  She has a DVT on the left side.  She has swelling in the left leg.  She had left knee pain we took an x-ray her prosthesis is in good position  She is on Eliquis 5 mg twice a day for the DVT but I anticipate that will be for 6 months per Dr. Phillips OdorGolding  Hip flexion leg lengths are normal  Recommend 1 month follow-up  She is weightbearing as tolerated with lateral hip precautions

## 2016-11-26 ENCOUNTER — Ambulatory Visit: Payer: Self-pay

## 2016-11-27 ENCOUNTER — Telehealth: Payer: Self-pay | Admitting: Radiology

## 2016-11-27 DIAGNOSIS — I10 Essential (primary) hypertension: Secondary | ICD-10-CM | POA: Diagnosis not present

## 2016-11-27 DIAGNOSIS — F329 Major depressive disorder, single episode, unspecified: Secondary | ICD-10-CM | POA: Diagnosis not present

## 2016-11-27 DIAGNOSIS — S72002D Fracture of unspecified part of neck of left femur, subsequent encounter for closed fracture with routine healing: Secondary | ICD-10-CM | POA: Diagnosis not present

## 2016-11-27 DIAGNOSIS — F411 Generalized anxiety disorder: Secondary | ICD-10-CM | POA: Diagnosis not present

## 2016-11-27 DIAGNOSIS — G20 Parkinson's disease: Secondary | ICD-10-CM | POA: Diagnosis not present

## 2016-11-27 DIAGNOSIS — M5136 Other intervertebral disc degeneration, lumbar region: Secondary | ICD-10-CM | POA: Diagnosis not present

## 2016-11-27 NOTE — Telephone Encounter (Signed)
[facility] has called concerned about patient not having a support hose on her lower extremity, she has had a recent DVT. I explained Dr Romeo AppleHarrison out of the office, I could ask him on Tuesday about signing order for hose, or she could try Dr Phillips OdorGolding, who has treated DVT. She will call Dr Phillips OdorGolding to see if she can get the order today.

## 2016-11-30 ENCOUNTER — Other Ambulatory Visit: Payer: Self-pay

## 2016-11-30 NOTE — Patient Outreach (Signed)
[facility] Care Management  11/30/2016  Barbie HaggisBonnie F Balliet January 12, 1946 [id number]   Transition of care  Referral date: 11/24/16 Referral source: status post skilled nursing [facility] Insurance: [employer] Attempt #2  Telephone call to patient regarding transition of care follow up. Contact answering phone states she is patients daughter. Contact states patient is at a doctors appointment today and request call back at another time.  PLAN: RNCM will attempt 3rd telephone follow up with patient within 5 business days.   George InaDavina Travis Purk RN,BSN,CCM [facility] Telephonic  [phone]

## 2016-12-01 ENCOUNTER — Other Ambulatory Visit: Payer: Self-pay

## 2016-12-01 DIAGNOSIS — I10 Essential (primary) hypertension: Secondary | ICD-10-CM | POA: Diagnosis not present

## 2016-12-01 DIAGNOSIS — S72002D Fracture of unspecified part of neck of left femur, subsequent encounter for closed fracture with routine healing: Secondary | ICD-10-CM | POA: Diagnosis not present

## 2016-12-01 DIAGNOSIS — M5136 Other intervertebral disc degeneration, lumbar region: Secondary | ICD-10-CM | POA: Diagnosis not present

## 2016-12-01 DIAGNOSIS — F411 Generalized anxiety disorder: Secondary | ICD-10-CM | POA: Diagnosis not present

## 2016-12-01 DIAGNOSIS — G20 Parkinson's disease: Secondary | ICD-10-CM | POA: Diagnosis not present

## 2016-12-01 DIAGNOSIS — F329 Major depressive disorder, single episode, unspecified: Secondary | ICD-10-CM | POA: Diagnosis not present

## 2016-12-01 NOTE — Patient Outreach (Signed)
[facility]) Care Management  12/01/2016  Julie Farrell 1945-12-02 [id number]   Transition of care  Referral date:11/24/16 Referral source:[facility] Insurance:[employer] Attempt #3  Telephone call to patient regarding transition of care follow up. Contact answering phone states she is patients daughter. Contact states patient is at a doctors appointment today and request call back at another time.  PLAN:RNCM will send patient outreach  Letter to patient to attempt contact.    Julie InaDavina Karmela Bram RN,BSN,CCM [facility] Telephonic  [phone]

## 2016-12-03 DIAGNOSIS — M5136 Other intervertebral disc degeneration, lumbar region: Secondary | ICD-10-CM | POA: Diagnosis not present

## 2016-12-03 DIAGNOSIS — I10 Essential (primary) hypertension: Secondary | ICD-10-CM | POA: Diagnosis not present

## 2016-12-03 DIAGNOSIS — S72002D Fracture of unspecified part of neck of left femur, subsequent encounter for closed fracture with routine healing: Secondary | ICD-10-CM | POA: Diagnosis not present

## 2016-12-03 DIAGNOSIS — F411 Generalized anxiety disorder: Secondary | ICD-10-CM | POA: Diagnosis not present

## 2016-12-03 DIAGNOSIS — G20 Parkinson's disease: Secondary | ICD-10-CM | POA: Diagnosis not present

## 2016-12-03 DIAGNOSIS — F329 Major depressive disorder, single episode, unspecified: Secondary | ICD-10-CM | POA: Diagnosis not present

## 2016-12-04 DIAGNOSIS — F329 Major depressive disorder, single episode, unspecified: Secondary | ICD-10-CM | POA: Diagnosis not present

## 2016-12-04 DIAGNOSIS — F411 Generalized anxiety disorder: Secondary | ICD-10-CM | POA: Diagnosis not present

## 2016-12-04 DIAGNOSIS — G20 Parkinson's disease: Secondary | ICD-10-CM | POA: Diagnosis not present

## 2016-12-04 DIAGNOSIS — I10 Essential (primary) hypertension: Secondary | ICD-10-CM | POA: Diagnosis not present

## 2016-12-04 DIAGNOSIS — S72002D Fracture of unspecified part of neck of left femur, subsequent encounter for closed fracture with routine healing: Secondary | ICD-10-CM | POA: Diagnosis not present

## 2016-12-04 DIAGNOSIS — M5136 Other intervertebral disc degeneration, lumbar region: Secondary | ICD-10-CM | POA: Diagnosis not present

## 2016-12-08 DIAGNOSIS — M5136 Other intervertebral disc degeneration, lumbar region: Secondary | ICD-10-CM | POA: Diagnosis not present

## 2016-12-08 DIAGNOSIS — G20 Parkinson's disease: Secondary | ICD-10-CM | POA: Diagnosis not present

## 2016-12-08 DIAGNOSIS — F411 Generalized anxiety disorder: Secondary | ICD-10-CM | POA: Diagnosis not present

## 2016-12-08 DIAGNOSIS — S72002D Fracture of unspecified part of neck of left femur, subsequent encounter for closed fracture with routine healing: Secondary | ICD-10-CM | POA: Diagnosis not present

## 2016-12-08 DIAGNOSIS — I10 Essential (primary) hypertension: Secondary | ICD-10-CM | POA: Diagnosis not present

## 2016-12-08 DIAGNOSIS — F329 Major depressive disorder, single episode, unspecified: Secondary | ICD-10-CM | POA: Diagnosis not present

## 2016-12-11 DIAGNOSIS — F411 Generalized anxiety disorder: Secondary | ICD-10-CM | POA: Diagnosis not present

## 2016-12-11 DIAGNOSIS — M5136 Other intervertebral disc degeneration, lumbar region: Secondary | ICD-10-CM | POA: Diagnosis not present

## 2016-12-11 DIAGNOSIS — G20 Parkinson's disease: Secondary | ICD-10-CM | POA: Diagnosis not present

## 2016-12-11 DIAGNOSIS — I10 Essential (primary) hypertension: Secondary | ICD-10-CM | POA: Diagnosis not present

## 2016-12-11 DIAGNOSIS — S72002D Fracture of unspecified part of neck of left femur, subsequent encounter for closed fracture with routine healing: Secondary | ICD-10-CM | POA: Diagnosis not present

## 2016-12-11 DIAGNOSIS — F329 Major depressive disorder, single episode, unspecified: Secondary | ICD-10-CM | POA: Diagnosis not present

## 2016-12-15 DIAGNOSIS — F411 Generalized anxiety disorder: Secondary | ICD-10-CM | POA: Diagnosis not present

## 2016-12-15 DIAGNOSIS — F329 Major depressive disorder, single episode, unspecified: Secondary | ICD-10-CM | POA: Diagnosis not present

## 2016-12-15 DIAGNOSIS — I10 Essential (primary) hypertension: Secondary | ICD-10-CM | POA: Diagnosis not present

## 2016-12-15 DIAGNOSIS — G20 Parkinson's disease: Secondary | ICD-10-CM | POA: Diagnosis not present

## 2016-12-15 DIAGNOSIS — S72002D Fracture of unspecified part of neck of left femur, subsequent encounter for closed fracture with routine healing: Secondary | ICD-10-CM | POA: Diagnosis not present

## 2016-12-15 DIAGNOSIS — M5136 Other intervertebral disc degeneration, lumbar region: Secondary | ICD-10-CM | POA: Diagnosis not present

## 2016-12-16 DIAGNOSIS — M5136 Other intervertebral disc degeneration, lumbar region: Secondary | ICD-10-CM | POA: Diagnosis not present

## 2016-12-16 DIAGNOSIS — F329 Major depressive disorder, single episode, unspecified: Secondary | ICD-10-CM | POA: Diagnosis not present

## 2016-12-16 DIAGNOSIS — I10 Essential (primary) hypertension: Secondary | ICD-10-CM | POA: Diagnosis not present

## 2016-12-16 DIAGNOSIS — S72002D Fracture of unspecified part of neck of left femur, subsequent encounter for closed fracture with routine healing: Secondary | ICD-10-CM | POA: Diagnosis not present

## 2016-12-16 DIAGNOSIS — G20 Parkinson's disease: Secondary | ICD-10-CM | POA: Diagnosis not present

## 2016-12-16 DIAGNOSIS — F411 Generalized anxiety disorder: Secondary | ICD-10-CM | POA: Diagnosis not present

## 2016-12-17 DIAGNOSIS — M5136 Other intervertebral disc degeneration, lumbar region: Secondary | ICD-10-CM | POA: Diagnosis not present

## 2016-12-17 DIAGNOSIS — N342 Other urethritis: Secondary | ICD-10-CM | POA: Diagnosis not present

## 2016-12-17 DIAGNOSIS — Z681 Body mass index (BMI) 19 or less, adult: Secondary | ICD-10-CM | POA: Diagnosis not present

## 2016-12-17 DIAGNOSIS — S72002D Fracture of unspecified part of neck of left femur, subsequent encounter for closed fracture with routine healing: Secondary | ICD-10-CM | POA: Diagnosis not present

## 2016-12-17 DIAGNOSIS — R600 Localized edema: Secondary | ICD-10-CM | POA: Diagnosis not present

## 2016-12-17 DIAGNOSIS — G20 Parkinson's disease: Secondary | ICD-10-CM | POA: Diagnosis not present

## 2016-12-17 DIAGNOSIS — F411 Generalized anxiety disorder: Secondary | ICD-10-CM | POA: Diagnosis not present

## 2016-12-17 DIAGNOSIS — I82402 Acute embolism and thrombosis of unspecified deep veins of left lower extremity: Secondary | ICD-10-CM | POA: Diagnosis not present

## 2016-12-17 DIAGNOSIS — I10 Essential (primary) hypertension: Secondary | ICD-10-CM | POA: Diagnosis not present

## 2016-12-17 DIAGNOSIS — F329 Major depressive disorder, single episode, unspecified: Secondary | ICD-10-CM | POA: Diagnosis not present

## 2016-12-21 ENCOUNTER — Ambulatory Visit: Payer: Medicare HMO | Admitting: Orthopedic Surgery

## 2016-12-21 ENCOUNTER — Telehealth: Payer: Self-pay | Admitting: Orthopedic Surgery

## 2016-12-21 NOTE — Telephone Encounter (Signed)
Pt's daughter Rosey Batheresa, canceled appoiment.  She will call back to reschedule the appointment

## 2016-12-22 DIAGNOSIS — F329 Major depressive disorder, single episode, unspecified: Secondary | ICD-10-CM | POA: Diagnosis not present

## 2016-12-22 DIAGNOSIS — G20 Parkinson's disease: Secondary | ICD-10-CM | POA: Diagnosis not present

## 2016-12-22 DIAGNOSIS — F411 Generalized anxiety disorder: Secondary | ICD-10-CM | POA: Diagnosis not present

## 2016-12-22 DIAGNOSIS — S72002D Fracture of unspecified part of neck of left femur, subsequent encounter for closed fracture with routine healing: Secondary | ICD-10-CM | POA: Diagnosis not present

## 2016-12-22 DIAGNOSIS — I10 Essential (primary) hypertension: Secondary | ICD-10-CM | POA: Diagnosis not present

## 2016-12-22 DIAGNOSIS — M5136 Other intervertebral disc degeneration, lumbar region: Secondary | ICD-10-CM | POA: Diagnosis not present

## 2016-12-23 ENCOUNTER — Ambulatory Visit: Payer: Medicare HMO | Admitting: Orthopedic Surgery

## 2016-12-24 ENCOUNTER — Other Ambulatory Visit: Payer: Self-pay

## 2016-12-24 DIAGNOSIS — M5136 Other intervertebral disc degeneration, lumbar region: Secondary | ICD-10-CM | POA: Diagnosis not present

## 2016-12-24 DIAGNOSIS — G20 Parkinson's disease: Secondary | ICD-10-CM | POA: Diagnosis not present

## 2016-12-24 DIAGNOSIS — F411 Generalized anxiety disorder: Secondary | ICD-10-CM | POA: Diagnosis not present

## 2016-12-24 DIAGNOSIS — I10 Essential (primary) hypertension: Secondary | ICD-10-CM | POA: Diagnosis not present

## 2016-12-24 DIAGNOSIS — F329 Major depressive disorder, single episode, unspecified: Secondary | ICD-10-CM | POA: Diagnosis not present

## 2016-12-24 DIAGNOSIS — S72002D Fracture of unspecified part of neck of left femur, subsequent encounter for closed fracture with routine healing: Secondary | ICD-10-CM | POA: Diagnosis not present

## 2016-12-24 NOTE — Patient Outreach (Signed)
[facility]) Care Management  12/24/2016  Julie HaggisBonnie F Farrell 09-08-1945 [id number]  No response from patient after 3 telephone calls and outreach letter attempt.  PLAN: RNCM will refer patient to care management assistant to close due to being unable to reach.  RNCM will send patients primary MD notification of closure.   George [name] RN,BSN,CCM [facility] Telephonic  [phone]

## 2016-12-29 DIAGNOSIS — F411 Generalized anxiety disorder: Secondary | ICD-10-CM | POA: Diagnosis not present

## 2016-12-29 DIAGNOSIS — I10 Essential (primary) hypertension: Secondary | ICD-10-CM | POA: Diagnosis not present

## 2016-12-29 DIAGNOSIS — S72002D Fracture of unspecified part of neck of left femur, subsequent encounter for closed fracture with routine healing: Secondary | ICD-10-CM | POA: Diagnosis not present

## 2016-12-29 DIAGNOSIS — G20 Parkinson's disease: Secondary | ICD-10-CM | POA: Diagnosis not present

## 2016-12-29 DIAGNOSIS — F329 Major depressive disorder, single episode, unspecified: Secondary | ICD-10-CM | POA: Diagnosis not present

## 2016-12-29 DIAGNOSIS — M5136 Other intervertebral disc degeneration, lumbar region: Secondary | ICD-10-CM | POA: Diagnosis not present

## 2016-12-30 ENCOUNTER — Encounter: Payer: Self-pay | Admitting: Orthopedic Surgery

## 2016-12-30 ENCOUNTER — Ambulatory Visit (INDEPENDENT_AMBULATORY_CARE_PROVIDER_SITE_OTHER): Payer: Medicare HMO | Admitting: Orthopedic Surgery

## 2016-12-30 VITALS — BP 119/69 | HR 67 | Ht 61.0 in | Wt 98.0 lb

## 2016-12-30 DIAGNOSIS — Z4889 Encounter for other specified surgical aftercare: Secondary | ICD-10-CM

## 2016-12-30 DIAGNOSIS — I82492 Acute embolism and thrombosis of other specified deep vein of left lower extremity: Secondary | ICD-10-CM

## 2016-12-30 NOTE — Progress Notes (Signed)
[facility]   Chief Complaint  Patient presents with  . Post-op Follow-up    LEFT BIPOLAR HIP DOS 10/16/16    Approximately 3 months status post left bipolar for femoral neck fracture comp gated by deep vein thrombosis.  Currently on Eliquis  Intermittent swelling left leg.  Able to weight-bear as tolerated with a walker independently  Hip flexion is 120 degrees pain-free  Recommend continue Eliquis, follow-up with [facility] regarding DVT and return in 3 months

## 2016-12-31 DIAGNOSIS — S72002D Fracture of unspecified part of neck of left femur, subsequent encounter for closed fracture with routine healing: Secondary | ICD-10-CM | POA: Diagnosis not present

## 2016-12-31 DIAGNOSIS — M5136 Other intervertebral disc degeneration, lumbar region: Secondary | ICD-10-CM | POA: Diagnosis not present

## 2016-12-31 DIAGNOSIS — F329 Major depressive disorder, single episode, unspecified: Secondary | ICD-10-CM | POA: Diagnosis not present

## 2016-12-31 DIAGNOSIS — I10 Essential (primary) hypertension: Secondary | ICD-10-CM | POA: Diagnosis not present

## 2016-12-31 DIAGNOSIS — F411 Generalized anxiety disorder: Secondary | ICD-10-CM | POA: Diagnosis not present

## 2016-12-31 DIAGNOSIS — G20 Parkinson's disease: Secondary | ICD-10-CM | POA: Diagnosis not present

## 2017-01-04 DIAGNOSIS — F329 Major depressive disorder, single episode, unspecified: Secondary | ICD-10-CM | POA: Diagnosis not present

## 2017-01-04 DIAGNOSIS — M5136 Other intervertebral disc degeneration, lumbar region: Secondary | ICD-10-CM | POA: Diagnosis not present

## 2017-01-04 DIAGNOSIS — I10 Essential (primary) hypertension: Secondary | ICD-10-CM | POA: Diagnosis not present

## 2017-01-04 DIAGNOSIS — F411 Generalized anxiety disorder: Secondary | ICD-10-CM | POA: Diagnosis not present

## 2017-01-04 DIAGNOSIS — G20 Parkinson's disease: Secondary | ICD-10-CM | POA: Diagnosis not present

## 2017-01-04 DIAGNOSIS — S72002D Fracture of unspecified part of neck of left femur, subsequent encounter for closed fracture with routine healing: Secondary | ICD-10-CM | POA: Diagnosis not present

## 2017-01-05 DIAGNOSIS — F411 Generalized anxiety disorder: Secondary | ICD-10-CM | POA: Diagnosis not present

## 2017-01-05 DIAGNOSIS — M5136 Other intervertebral disc degeneration, lumbar region: Secondary | ICD-10-CM | POA: Diagnosis not present

## 2017-01-05 DIAGNOSIS — G20 Parkinson's disease: Secondary | ICD-10-CM | POA: Diagnosis not present

## 2017-01-05 DIAGNOSIS — S72002D Fracture of unspecified part of neck of left femur, subsequent encounter for closed fracture with routine healing: Secondary | ICD-10-CM | POA: Diagnosis not present

## 2017-01-05 DIAGNOSIS — I10 Essential (primary) hypertension: Secondary | ICD-10-CM | POA: Diagnosis not present

## 2017-01-05 DIAGNOSIS — F329 Major depressive disorder, single episode, unspecified: Secondary | ICD-10-CM | POA: Diagnosis not present

## 2017-01-06 DIAGNOSIS — S72002D Fracture of unspecified part of neck of left femur, subsequent encounter for closed fracture with routine healing: Secondary | ICD-10-CM | POA: Diagnosis not present

## 2017-01-06 DIAGNOSIS — M5136 Other intervertebral disc degeneration, lumbar region: Secondary | ICD-10-CM | POA: Diagnosis not present

## 2017-01-06 DIAGNOSIS — F329 Major depressive disorder, single episode, unspecified: Secondary | ICD-10-CM | POA: Diagnosis not present

## 2017-01-06 DIAGNOSIS — F411 Generalized anxiety disorder: Secondary | ICD-10-CM | POA: Diagnosis not present

## 2017-01-06 DIAGNOSIS — I10 Essential (primary) hypertension: Secondary | ICD-10-CM | POA: Diagnosis not present

## 2017-01-06 DIAGNOSIS — G20 Parkinson's disease: Secondary | ICD-10-CM | POA: Diagnosis not present

## 2017-01-07 DIAGNOSIS — F411 Generalized anxiety disorder: Secondary | ICD-10-CM | POA: Diagnosis not present

## 2017-01-07 DIAGNOSIS — M5136 Other intervertebral disc degeneration, lumbar region: Secondary | ICD-10-CM | POA: Diagnosis not present

## 2017-01-07 DIAGNOSIS — G20 Parkinson's disease: Secondary | ICD-10-CM | POA: Diagnosis not present

## 2017-01-07 DIAGNOSIS — F329 Major depressive disorder, single episode, unspecified: Secondary | ICD-10-CM | POA: Diagnosis not present

## 2017-01-07 DIAGNOSIS — I10 Essential (primary) hypertension: Secondary | ICD-10-CM | POA: Diagnosis not present

## 2017-01-07 DIAGNOSIS — S72002D Fracture of unspecified part of neck of left femur, subsequent encounter for closed fracture with routine healing: Secondary | ICD-10-CM | POA: Diagnosis not present

## 2017-01-14 DIAGNOSIS — F329 Major depressive disorder, single episode, unspecified: Secondary | ICD-10-CM | POA: Diagnosis not present

## 2017-01-14 DIAGNOSIS — F411 Generalized anxiety disorder: Secondary | ICD-10-CM | POA: Diagnosis not present

## 2017-01-14 DIAGNOSIS — G20 Parkinson's disease: Secondary | ICD-10-CM | POA: Diagnosis not present

## 2017-01-14 DIAGNOSIS — S72002D Fracture of unspecified part of neck of left femur, subsequent encounter for closed fracture with routine healing: Secondary | ICD-10-CM | POA: Diagnosis not present

## 2017-01-14 DIAGNOSIS — I10 Essential (primary) hypertension: Secondary | ICD-10-CM | POA: Diagnosis not present

## 2017-01-14 DIAGNOSIS — M5136 Other intervertebral disc degeneration, lumbar region: Secondary | ICD-10-CM | POA: Diagnosis not present

## 2017-01-15 DIAGNOSIS — I10 Essential (primary) hypertension: Secondary | ICD-10-CM | POA: Diagnosis not present

## 2017-01-15 DIAGNOSIS — F329 Major depressive disorder, single episode, unspecified: Secondary | ICD-10-CM | POA: Diagnosis not present

## 2017-01-15 DIAGNOSIS — F411 Generalized anxiety disorder: Secondary | ICD-10-CM | POA: Diagnosis not present

## 2017-01-15 DIAGNOSIS — G20 Parkinson's disease: Secondary | ICD-10-CM | POA: Diagnosis not present

## 2017-01-15 DIAGNOSIS — M5136 Other intervertebral disc degeneration, lumbar region: Secondary | ICD-10-CM | POA: Diagnosis not present

## 2017-01-15 DIAGNOSIS — S72002D Fracture of unspecified part of neck of left femur, subsequent encounter for closed fracture with routine healing: Secondary | ICD-10-CM | POA: Diagnosis not present

## 2017-01-20 DIAGNOSIS — F411 Generalized anxiety disorder: Secondary | ICD-10-CM | POA: Diagnosis not present

## 2017-01-20 DIAGNOSIS — M5136 Other intervertebral disc degeneration, lumbar region: Secondary | ICD-10-CM | POA: Diagnosis not present

## 2017-01-20 DIAGNOSIS — S72002D Fracture of unspecified part of neck of left femur, subsequent encounter for closed fracture with routine healing: Secondary | ICD-10-CM | POA: Diagnosis not present

## 2017-01-20 DIAGNOSIS — F329 Major depressive disorder, single episode, unspecified: Secondary | ICD-10-CM | POA: Diagnosis not present

## 2017-01-20 DIAGNOSIS — G20 Parkinson's disease: Secondary | ICD-10-CM | POA: Diagnosis not present

## 2017-01-20 DIAGNOSIS — I10 Essential (primary) hypertension: Secondary | ICD-10-CM | POA: Diagnosis not present

## 2017-01-21 DIAGNOSIS — I10 Essential (primary) hypertension: Secondary | ICD-10-CM | POA: Diagnosis not present

## 2017-01-21 DIAGNOSIS — G20 Parkinson's disease: Secondary | ICD-10-CM | POA: Diagnosis not present

## 2017-01-21 DIAGNOSIS — M5136 Other intervertebral disc degeneration, lumbar region: Secondary | ICD-10-CM | POA: Diagnosis not present

## 2017-01-21 DIAGNOSIS — F411 Generalized anxiety disorder: Secondary | ICD-10-CM | POA: Diagnosis not present

## 2017-01-21 DIAGNOSIS — F329 Major depressive disorder, single episode, unspecified: Secondary | ICD-10-CM | POA: Diagnosis not present

## 2017-01-21 DIAGNOSIS — S72002D Fracture of unspecified part of neck of left femur, subsequent encounter for closed fracture with routine healing: Secondary | ICD-10-CM | POA: Diagnosis not present

## 2017-01-22 DIAGNOSIS — G20 Parkinson's disease: Secondary | ICD-10-CM | POA: Diagnosis not present

## 2017-01-22 DIAGNOSIS — I10 Essential (primary) hypertension: Secondary | ICD-10-CM | POA: Diagnosis not present

## 2017-01-22 DIAGNOSIS — F411 Generalized anxiety disorder: Secondary | ICD-10-CM | POA: Diagnosis not present

## 2017-01-22 DIAGNOSIS — S72002D Fracture of unspecified part of neck of left femur, subsequent encounter for closed fracture with routine healing: Secondary | ICD-10-CM | POA: Diagnosis not present

## 2017-01-22 DIAGNOSIS — F329 Major depressive disorder, single episode, unspecified: Secondary | ICD-10-CM | POA: Diagnosis not present

## 2017-01-22 DIAGNOSIS — M5136 Other intervertebral disc degeneration, lumbar region: Secondary | ICD-10-CM | POA: Diagnosis not present

## 2017-01-25 ENCOUNTER — Other Ambulatory Visit: Payer: Self-pay | Admitting: Diagnostic Neuroimaging

## 2017-02-05 ENCOUNTER — Telehealth: Payer: Self-pay | Admitting: Orthopedic Surgery

## 2017-02-05 DIAGNOSIS — Z9889 Other specified postprocedural states: Secondary | ICD-10-CM

## 2017-02-05 DIAGNOSIS — S22000A Wedge compression fracture of unspecified thoracic vertebra, initial encounter for closed fracture: Secondary | ICD-10-CM

## 2017-02-05 NOTE — Telephone Encounter (Signed)
Patient's daughter is asking if Dr. Romeo AppleHarrison will write patient a script for a walker and a potty chair.  Please call and advise

## 2017-02-08 NOTE — Telephone Encounter (Signed)
Called daughter, she asked me to [facility], I have done this.

## 2017-02-10 DIAGNOSIS — R269 Unspecified abnormalities of gait and mobility: Secondary | ICD-10-CM | POA: Diagnosis not present

## 2017-02-10 DIAGNOSIS — S72012A Unspecified intracapsular fracture of left femur, initial encounter for closed fracture: Secondary | ICD-10-CM | POA: Diagnosis not present

## 2017-02-22 ENCOUNTER — Encounter (INDEPENDENT_AMBULATORY_CARE_PROVIDER_SITE_OTHER): Payer: Self-pay

## 2017-02-22 ENCOUNTER — Ambulatory Visit: Payer: Medicare HMO | Admitting: Diagnostic Neuroimaging

## 2017-02-22 ENCOUNTER — Encounter: Payer: Self-pay | Admitting: Diagnostic Neuroimaging

## 2017-02-22 VITALS — BP 113/68 | HR 77 | Wt 102.0 lb

## 2017-02-22 DIAGNOSIS — G20 Parkinson's disease: Secondary | ICD-10-CM

## 2017-02-22 DIAGNOSIS — R29898 Other symptoms and signs involving the musculoskeletal system: Secondary | ICD-10-CM

## 2017-02-22 DIAGNOSIS — R258 Other abnormal involuntary movements: Secondary | ICD-10-CM | POA: Diagnosis not present

## 2017-02-22 MED ORDER — CARBIDOPA-LEVODOPA 25-100 MG PO TABS: ORAL_TABLET | ORAL | 4 refills | Status: DC

## 2017-02-22 NOTE — Patient Instructions (Signed)
-  continue current meds  

## 2017-02-22 NOTE — Progress Notes (Signed)
[facility]  PATIENT: Julie Farrell DOB: December 17, 1945  REFERRING CLINICIAN: Junius Argyle HISTORY FROM: patient and daughter  REASON FOR VISIT: follow up    HISTORICAL  CHIEF COMPLAINT:  Chief Complaint  Patient presents with  . Parkinson's disease    rm 6, dgtr- Rosey Bath, "a little bit more nervous since hip surgery; sound in my head sometimes like a train; more shakey"  . Follow-up    6 month    HISTORY OF PRESENT ILLNESS:   UPDATE (02/22/17, VRP): Since last visit, doing well, except had left hip fx (Sept 2018), leading to a fall, now s/p repair. Tolerating carb/levo 1 tab three times a day. No alleviating or aggravating factors.  UPDATE 07/20/16: Since last visit, doing about the same. Tried carb/levo, but had dizziness, tired feeling and could not tolerate after 2-3 week trial. Had a fall and went to ER a few weeks ago (tripped on cat). Has gone to PT. Using cane and walker.   PRIOR HPI (05/19/16): 72 year old right-handed female here for evaluation of speech, language, gait difficulty. Patient has had gradual onset, progressive speech, gait, leg difficulty for past 1 year. Patient having soft hoarse voice, difficult to understand, some mild swallowing difficulty. Patient also having issues with small short steps, decreased mobility, decreased movement, decreased speed of activities. Sometimes she feels her toes draw up on the left side. Patient having decreased facial expressions. Patient having more problems with anxiety and "jitteriness". She is also had 40 pound weight loss over the past one year, unintentionally. Patient imaging generalized fatigue, swelling legs, constipation, not asleep. No family history of tremor, Parkinson's disease or other similar problems. No sudden onset of any symptoms.   REVIEW OF SYSTEMS: Full 14 system review of systems performed and negative with exception of: only as per HPI.   ALLERGIES: Allergies  Allergen Reactions  .  Vicodin [Hydrocodone-Acetaminophen] Other (See Comments)    Upset stomach    HOME MEDICATIONS: Outpatient Medications Prior to Visit  Medication Sig Dispense Refill  . amLODipine (NORVASC) 5 MG tablet Take 5 mg by mouth daily.   1  . apixaban (ELIQUIS) 5 MG TABS tablet Take 2 tablets by mouth to = 10 mg for 7 days from 11/21/2016-11/27/2016 then take 1 tablet 5 mg by mouth stating on 11/28/2016    . aspirin EC 81 MG tablet Take 81 mg by mouth daily.    . bisacodyl (DULCOLAX) 10 MG suppository Place 1 suppository (10 mg total) rectally daily as needed for moderate constipation. 12 suppository 0  . carbidopa-levodopa (SINEMET IR) 25-100 MG tablet Take 1 tablet by mouth 3 (three) times daily.    . carbidopa-levodopa (SINEMET IR) 25-100 MG tablet take 1 tablet by mouth three times a day before meals 90 tablet 6  . cholecalciferol (VITAMIN D) 1000 units tablet Take 1 tablet (1,000 Units total) by mouth daily.    Marland Kitchen docusate sodium (COLACE) 100 MG capsule Take 1 capsule (100 mg total) by mouth 2 (two) times daily. 10 capsule 0  . escitalopram (LEXAPRO) 10 MG tablet Take 1 tablet (10 mg total) by mouth daily. 30 tablet 6  . Ferrous Sulfate (IRON) 325 (65 Fe) MG TABS Take 1 tablet by mouth once a day    . levothyroxine (SYNTHROID, LEVOTHROID) 50 MCG tablet Take 50 mcg by mouth daily.  0  . OMEGA 3-6-9 FATTY ACIDS PO Take 1 tablet by mouth daily.    Marland Kitchen oxyCODONE (OXY IR/ROXICODONE) 5 MG immediate release tablet Take  1 tablet (5 mg total) by mouth every 6 (six) hours as needed for severe pain ((for MODERATE breakthrough pain)). 30 tablet 0  . polyethylene glycol (MIRALAX / GLYCOLAX) packet Take 17 g by mouth daily.    . simvastatin (ZOCOR) 20 MG tablet Take 20 mg by mouth at bedtime.    . vitamin B-12 (CYANOCOBALAMIN) 100 MCG tablet Take 100 mcg by mouth daily.     No facility-administered medications prior to visit.     PAST MEDICAL HISTORY: Past Medical History:  Diagnosis Date  . Abnormal pap   .  Anxiety   . DDD (degenerative disc disease), lumbar   . Hemorrhoids   . HPV test positive 05/17/2013  . Hyperlipidemia   . Hypertension   . Hypothyroidism   . Parkinson's disease (HCC)   . Thyroid disease   . Vaginal Pap smear, abnormal     PAST SURGICAL HISTORY: Past Surgical History:  Procedure Laterality Date  . BACK SURGERY  10/2015   fusion L4-L 5, Dr Lovell Sheehan  . CATARACT EXTRACTION W/PHACO Left 05/16/2012   Procedure: CATARACT EXTRACTION PHACO AND INTRAOCULAR LENS PLACEMENT (IOC);  Surgeon: Gemma Payor, MD;  Location: [facility];  Service: Ophthalmology;  Laterality: Left;  CDE:15.20  . COLONOSCOPY  2009   [facility]-Dr. Karilyn Cota  . EYE SURGERY Bilateral 14,15   cataracts  . Fatty Tissue Excision Left 2008   [facility]-Dr. Hilda Lias arm  . HIP ARTHROPLASTY Left 10/16/2016   Procedure: ARTHROPLASTY BIPOLAR HIP (HEMIARTHROPLASTY);  Surgeon: Vickki Hearing, MD;  Location: [facility];  Service: Orthopedics;  Laterality: Left;  . right ankle Right    fx    FAMILY HISTORY: Family History  Problem Relation Age of Onset  . Heart attack Mother   . Heart disease Mother        CAD  . Heart disease Brother   . Heart disease Brother   . Hypertension Father   . Stroke Father     SOCIAL HISTORY:  Social History   Socioeconomic History  . Marital status: Single    Spouse name: Not on file  . Number of children: 1  . Years of education: GED  . Highest education level: Not on file  Social Needs  . Financial resource strain: Not on file  . Food insecurity - worry: Not on file  . Food insecurity - inability: Not on file  . Transportation needs - medical: Not on file  . Transportation needs - non-medical: Not on file  Occupational History    Comment: NA  Tobacco Use  . Smoking status: Never Smoker  . Smokeless tobacco: Never Used  Substance and Sexual Activity  . Alcohol use: No  . Drug use: No  . Sexual activity: Not Currently    Birth control/protection: Post-menopausal  Other Topics  Concern  . Not on file  Social History Narrative   Lives with daughter, Rosey Bath   Caffeine -coffee occas     PHYSICAL EXAM  GENERAL EXAM/CONSTITUTIONAL: Vitals:  Vitals:   02/22/17 1310  BP: 113/68  Pulse: 77  Weight: 102 lb (46.3 kg)   Body mass index is 19.27 kg/m. No exam data present  Patient is in no distress; well developed, nourished and groomed; neck is supple  MASKED FACIES  CARDIOVASCULAR:  Examination of carotid arteries is normal; no carotid bruits  Regular rate and rhythm, no murmurs  Examination of peripheral vascular system by observation and palpation is normal  EYES:  Ophthalmoscopic exam of optic discs and posterior segments is  normal; no papilledema or hemorrhages  MUSCULOSKELETAL:  Gait, strength, tone, movements noted in Neurologic exam below  NEUROLOGIC: MENTAL STATUS:  No flowsheet data found.  awake, alert, oriented to person, place and time  recent and remote memory intact  normal attention and concentration  language fluent, comprehension intact, naming intact,   fund of knowledge appropriate  CRANIAL NERVE:   2nd - no papilledema on fundoscopic exam  2nd, 3rd, 4th, 6th - pupils equal and reactive to light, visual fields full to confrontation, extraocular muscles intact, no nystagmus  5th - facial sensation symmetric  7th - facial strength symmetric  8th - hearing intact  9th - palate elevates symmetrically, uvula midline  11th - shoulder shrug symmetric  12th - tongue protrusion midline  HOARSE VOICE  SOFT VOLUME  STUTTERING SPEECH  MOTOR:   COGWHEELING RIGIDITY IN BUE  MODERATE-SEVERE BRADYKINESIA IN BUE (LUE SLOWER THAN RIGHT) AND BLE  FINE POSTURAL TREMOR  full strength in the BUE, BLE  SENSORY:   normal and symmetric to light touch, temperature, vibration  COORDINATION:   finger-nose-finger, fine finger movements MILD DYSMETRIA  REFLEXES:   deep tendon reflexes BRISK IN BUE; BLE  2  GAIT/STATION:   STOOPED POSTURE; SLIGHT LIMPING ON RIGHT LEG; SHORT STEPS; SHUFFLING GAIT; EN BLOC TURNING; FREEZING WITH TURNS    DIAGNOSTIC DATA (LABS, IMAGING, TESTING) - I reviewed patient records, labs, notes, testing and imaging myself where available.  Lab Results  Component Value Date   WBC 5.7 11/21/2016   HGB 11.8 (L) 11/21/2016   HCT 36.2 11/21/2016   MCV 95.5 11/21/2016   PLT 301 11/21/2016      Component Value Date/Time   NA 136 11/21/2016 1115   K 4.1 11/21/2016 1115   CL 100 (L) 11/21/2016 1115   CO2 26 11/21/2016 1115   GLUCOSE 98 11/21/2016 1115   BUN 25 (H) 11/21/2016 1115   CREATININE 0.81 11/21/2016 1115   CALCIUM 9.9 11/21/2016 1115   PROT 5.8 (L) 10/27/2016 0700   ALBUMIN 2.9 (L) 10/27/2016 0700   AST 15 10/27/2016 0700   ALT 8 (L) 10/27/2016 0700   ALKPHOS 71 10/27/2016 0700   BILITOT 0.7 10/27/2016 0700   GFRNONAA >60 11/21/2016 1115   GFRAA >60 11/21/2016 1115   No results found for: CHOL, HDL, LDLCALC, LDLDIRECT, TRIG, CHOLHDL No results found for: ZOXW9UHGBA1C No results found for: VITAMINB12 Lab Results  Component Value Date   TSH 2.944 10/27/2016     10/31/15 xray lumbar [I reviewed images myself and agree with interpretation. -VRP]  - Posterior lumbar interbody fusion at L4-L5.  06/11/16 MRI brain [I reviewed images myself and agree with interpretation. -VRP]  1.   T2/FLAIR hyperintense foci in the subcortical and deep white matter of the frontal lobes consistent with altered chronic microvascular ischemic changes.   2.   There are no acute findings.  07/06/16 xray lumbar spine and right hip - No acute abnormality. - Status post L4-5 fusion.     ASSESSMENT AND PLAN  72 y.o. year old female here with gradual onset progressive speech, swallowing, gait difficulty. Exam notable for bradykinesia, cogwheel rigidity, short shuffling steps, postural instability, rare resting tremor. Signs and symptoms consistent with idiopathic  Parkinson's disease.    Ddx: parkinsonism (idiopathic parkinson's disease) + low back pain + anxiety  1. Parkinson's disease (HCC)   2. Bradykinesia   3. Cogwheel rigidity      PLAN:  - continue carb/levo 25/100 (1 tab three times a day) -  continue physical therapy exercises - use rollator walker  Meds ordered this encounter  Medications  . carbidopa-levodopa (SINEMET IR) 25-100 MG tablet    Sig: take 1 tablet by mouth three times a day before meals    Dispense:  180 tablet    Refill:  4   Return in about 6 months (around 08/22/2017).    Suanne Marker, MD 02/22/2017, 1:45 PM Certified in Neurology, Neurophysiology and Neuroimaging  [facility] [address], [address] [place], [place] [address] [phone]

## 2017-03-12 ENCOUNTER — Other Ambulatory Visit: Payer: Self-pay | Admitting: Adult Health

## 2017-03-12 DIAGNOSIS — Z1231 Encounter for screening mammogram for malignant neoplasm of breast: Secondary | ICD-10-CM

## 2017-03-24 DIAGNOSIS — Z1389 Encounter for screening for other disorder: Secondary | ICD-10-CM | POA: Diagnosis not present

## 2017-03-24 DIAGNOSIS — N390 Urinary tract infection, site not specified: Secondary | ICD-10-CM | POA: Diagnosis not present

## 2017-03-24 DIAGNOSIS — R39198 Other difficulties with micturition: Secondary | ICD-10-CM | POA: Diagnosis not present

## 2017-03-24 DIAGNOSIS — M2012 Hallux valgus (acquired), left foot: Secondary | ICD-10-CM | POA: Diagnosis not present

## 2017-03-24 DIAGNOSIS — M7989 Other specified soft tissue disorders: Secondary | ICD-10-CM | POA: Diagnosis not present

## 2017-03-24 DIAGNOSIS — H6123 Impacted cerumen, bilateral: Secondary | ICD-10-CM | POA: Diagnosis not present

## 2017-03-24 DIAGNOSIS — N342 Other urethritis: Secondary | ICD-10-CM | POA: Diagnosis not present

## 2017-03-24 DIAGNOSIS — Z681 Body mass index (BMI) 19 or less, adult: Secondary | ICD-10-CM | POA: Diagnosis not present

## 2017-03-31 ENCOUNTER — Ambulatory Visit: Payer: Medicare HMO | Admitting: Orthopedic Surgery

## 2017-04-08 ENCOUNTER — Ambulatory Visit: Payer: Medicare HMO | Admitting: Orthopedic Surgery

## 2017-04-16 ENCOUNTER — Telehealth: Payer: Self-pay | Admitting: Orthopedic Surgery

## 2017-04-16 ENCOUNTER — Ambulatory Visit: Payer: Medicare HMO | Admitting: Orthopedic Surgery

## 2017-04-16 NOTE — Progress Notes (Deleted)
Progress Note   Patient ID: Julie Farrell, female   DOB: March 18, 1945, 72 y.o.   MRN: [id number]  No chief complaint on file.   HPI   ROS No outpatient medications have been marked as taking for the 04/16/17 encounter (Appointment) with Vickki HearingHarrison, Stanley E, MD.    Allergies  Allergen Reactions  . Vicodin [Hydrocodone-Acetaminophen] Other (See Comments)    Upset stomach     There were no vitals taken for this visit.  Physical Exam   Medical decision-making Encounter Diagnosis  Name Primary?  . History of Hemiarthroplasty of left hip 10/16/16 Yes      No orders of the defined types were placed in this encounter.    Fuller CanadaStanley Harrison, MD 04/16/2017 8:08 AM

## 2017-04-16 NOTE — Telephone Encounter (Signed)
Patient's daughter called and canceled appointment for today.  She said Julie Farrell will call back next week to reschedule the appointment.

## 2017-04-21 ENCOUNTER — Inpatient Hospital Stay: Admission: RE | Admit: 2017-04-21 | Payer: Medicare HMO | Source: Ambulatory Visit

## 2017-05-03 DIAGNOSIS — F419 Anxiety disorder, unspecified: Secondary | ICD-10-CM | POA: Diagnosis not present

## 2017-05-03 DIAGNOSIS — Z681 Body mass index (BMI) 19 or less, adult: Secondary | ICD-10-CM | POA: Diagnosis not present

## 2017-05-03 DIAGNOSIS — M2012 Hallux valgus (acquired), left foot: Secondary | ICD-10-CM | POA: Diagnosis not present

## 2017-05-03 DIAGNOSIS — F329 Major depressive disorder, single episode, unspecified: Secondary | ICD-10-CM | POA: Diagnosis not present

## 2017-06-23 ENCOUNTER — Ambulatory Visit: Payer: Medicare HMO | Admitting: Orthopedic Surgery

## 2017-06-24 ENCOUNTER — Ambulatory Visit: Payer: Medicare HMO | Admitting: Orthopedic Surgery

## 2017-06-28 DIAGNOSIS — Z1389 Encounter for screening for other disorder: Secondary | ICD-10-CM | POA: Diagnosis not present

## 2017-06-28 DIAGNOSIS — R300 Dysuria: Secondary | ICD-10-CM | POA: Diagnosis not present

## 2017-06-28 DIAGNOSIS — Z0001 Encounter for general adult medical examination with abnormal findings: Secondary | ICD-10-CM | POA: Diagnosis not present

## 2017-06-28 DIAGNOSIS — Z681 Body mass index (BMI) 19 or less, adult: Secondary | ICD-10-CM | POA: Diagnosis not present

## 2017-07-12 ENCOUNTER — Encounter: Payer: Self-pay | Admitting: Orthopedic Surgery

## 2017-07-12 ENCOUNTER — Ambulatory Visit: Payer: Medicare HMO | Admitting: Orthopedic Surgery

## 2017-07-12 VITALS — BP 96/59 | HR 76 | Ht 61.0 in

## 2017-07-12 DIAGNOSIS — Z9889 Other specified postprocedural states: Secondary | ICD-10-CM

## 2017-07-12 NOTE — Progress Notes (Signed)
Routine follow-up  Encounter Diagnosis  Name Primary?  . History of Hemiarthroplasty of left hip 10/16/16 Yes     Status post left bipolar hip replacement for hip fracture September 2018  She had a DVT she was treated with Eliquis that has been stopped   Her hip flexion is normal  She does have trouble getting out of a chair she has also been noted to have Parkinson's disease  When she gets up she is able to walk  She has constant contractures in the hands and feet which are intermittent but nonetheless recurrent  As far as her hip goes she is discharged weight-bear as tolerated and use a walker as needed  We ordered a lift chair for her  Follow-up PRN

## 2017-07-30 ENCOUNTER — Ambulatory Visit: Payer: Medicare HMO

## 2017-08-08 ENCOUNTER — Other Ambulatory Visit: Payer: Self-pay | Admitting: Diagnostic Neuroimaging

## 2017-08-09 NOTE — Telephone Encounter (Signed)
Patient requested 90 day supply. Refilled with 90 day supply, 2 additional refills.

## 2017-08-23 ENCOUNTER — Ambulatory Visit: Payer: Medicare HMO | Admitting: Diagnostic Neuroimaging

## 2017-08-23 ENCOUNTER — Encounter: Payer: Self-pay | Admitting: Diagnostic Neuroimaging

## 2017-08-23 VITALS — BP 105/64 | HR 69 | Ht 61.0 in | Wt 98.4 lb

## 2017-08-23 DIAGNOSIS — G20 Parkinson's disease: Secondary | ICD-10-CM

## 2017-08-23 MED ORDER — CARBIDOPA-LEVODOPA 25-100 MG PO TABS: 1.0000 | ORAL_TABLET | Freq: Three times a day (TID) | ORAL | 4 refills | Status: DC

## 2017-08-23 NOTE — Progress Notes (Signed)
[facility]  PATIENT: Julie Farrell DOB: 30-Sep-1945  REFERRING CLINICIAN: Junius Argyle HISTORY FROM: patient and daughter  REASON FOR VISIT: follow up    HISTORICAL  CHIEF COMPLAINT:  Chief Complaint  Patient presents with  . Parkinson's disease    rm 7, dgtr- Rosey Bath, "I am getting weaker and shaking more; not doing PT, prefer walker vs rolator- too difficult to use hand brakes"  . Follow-up    6 month    HISTORY OF PRESENT ILLNESS:   UPDATE (08/23/17, VRP): Since last visit, doing worse. Symptoms are moderate. Severity is moderate. No alleviating or aggravating factors. Tolerating carb/levo --> some wearing off in early AM. No falls. Using reg walker.   UPDATE (02/22/17, VRP): Since last visit, doing well, except had left hip fx (Sept 2018), leading to a fall, now s/p repair. Tolerating carb/levo 1 tab three times a day. No alleviating or aggravating factors.  UPDATE 07/20/16: Since last visit, doing about the same. Tried carb/levo, but had dizziness, tired feeling and could not tolerate after 2-3 week trial. Had a fall and went to ER a few weeks ago (tripped on cat). Has gone to PT. Using cane and walker.   PRIOR HPI (05/19/16): 72 year old right-handed female here for evaluation of speech, language, gait difficulty. Patient has had gradual onset, progressive speech, gait, leg difficulty for past 1 year. Patient having soft hoarse voice, difficult to understand, some mild swallowing difficulty. Patient also having issues with small short steps, decreased mobility, decreased movement, decreased speed of activities. Sometimes she feels her toes draw up on the left side. Patient having decreased facial expressions. Patient having more problems with anxiety and "jitteriness". She is also had 40 pound weight loss over the past one year, unintentionally. Patient imaging generalized fatigue, swelling legs, constipation, not asleep. No family history of tremor, Parkinson's  disease or other similar problems. No sudden onset of any symptoms.   REVIEW OF SYSTEMS: Full 14 system review of systems performed and negative with exception of: negative.   ALLERGIES: Allergies  Allergen Reactions  . Vicodin [Hydrocodone-Acetaminophen] Other (See Comments)    Upset stomach    HOME MEDICATIONS: Outpatient Medications Prior to Visit  Medication Sig Dispense Refill  . amLODipine (NORVASC) 5 MG tablet Take 5 mg by mouth daily.   1  . aspirin EC 81 MG tablet Take 81 mg by mouth daily.    . bisacodyl (DULCOLAX) 10 MG suppository Place 1 suppository (10 mg total) rectally daily as needed for moderate constipation. 12 suppository 0  . carbidopa-levodopa (SINEMET IR) 25-100 MG tablet TAKE 1 TABLET THREE TIMES A DAY BEFORE MEALS 270 tablet 2  . cholecalciferol (VITAMIN D) 1000 units tablet Take 1 tablet (1,000 Units total) by mouth daily.    Marland Kitchen docusate sodium (COLACE) 100 MG capsule Take 1 capsule (100 mg total) by mouth 2 (two) times daily. 10 capsule 0  . escitalopram (LEXAPRO) 10 MG tablet Take 1 tablet (10 mg total) by mouth daily. 30 tablet 6  . Ferrous Sulfate (IRON) 325 (65 Fe) MG TABS Take 1 tablet by mouth once a day    . levothyroxine (SYNTHROID, LEVOTHROID) 50 MCG tablet Take 50 mcg by mouth daily.  0  . LORazepam (ATIVAN) 0.5 MG tablet Take 0.5 mg by mouth every 8 (eight) hours as needed for anxiety.    . OMEGA 3-6-9 FATTY ACIDS PO Take 1 tablet by mouth daily.    Marland Kitchen oxybutynin (DITROPAN) 5 MG tablet TK 1 T PO  BID  11  . oxyCODONE (OXY IR/ROXICODONE) 5 MG immediate release tablet Take 1 tablet (5 mg total) by mouth every 6 (six) hours as needed for severe pain ((for MODERATE breakthrough pain)). 30 tablet 0  . polyethylene glycol (MIRALAX / GLYCOLAX) packet Take 17 g by mouth daily.    . simvastatin (ZOCOR) 20 MG tablet Take 20 mg by mouth at bedtime.    . vitamin B-12 (CYANOCOBALAMIN) 100 MCG tablet Take 100 mcg by mouth daily.    Marland Kitchen apixaban (ELIQUIS) 5 MG TABS  tablet Take 2 tablets by mouth to = 10 mg for 7 days from 11/21/2016-11/27/2016 then take 1 tablet 5 mg by mouth stating on 11/28/2016     No facility-administered medications prior to visit.     PAST MEDICAL HISTORY: Past Medical History:  Diagnosis Date  . Abnormal pap   . Anxiety   . DDD (degenerative disc disease), lumbar   . Hemorrhoids   . HPV test positive 05/17/2013  . Hyperlipidemia   . Hypertension   . Hypothyroidism   . Parkinson's disease (HCC)   . Thyroid disease   . Vaginal Pap smear, abnormal     PAST SURGICAL HISTORY: Past Surgical History:  Procedure Laterality Date  . BACK SURGERY  10/2015   fusion L4-L 5, Dr Lovell Sheehan  . CATARACT EXTRACTION W/PHACO Left 05/16/2012   Procedure: CATARACT EXTRACTION PHACO AND INTRAOCULAR LENS PLACEMENT (IOC);  Surgeon: Gemma Payor, MD;  Location: [facility];  Service: Ophthalmology;  Laterality: Left;  CDE:15.20  . COLONOSCOPY  2009   [facility]-Dr. Karilyn Cota  . EYE SURGERY Bilateral 14,15   cataracts  . Fatty Tissue Excision Left 2008   [facility]-Dr. Hilda Lias arm  . HIP ARTHROPLASTY Left 10/16/2016   Procedure: ARTHROPLASTY BIPOLAR HIP (HEMIARTHROPLASTY);  Surgeon: Vickki Hearing, MD;  Location: [facility];  Service: Orthopedics;  Laterality: Left;  . right ankle Right    fx    FAMILY HISTORY: Family History  Problem Relation Age of Onset  . Heart attack Mother   . Heart disease Mother        CAD  . Heart disease Brother   . Heart attack Brother   . Heart disease Brother   . Heart attack Brother   . Hypertension Father   . Stroke Father     SOCIAL HISTORY:  Social History   Socioeconomic History  . Marital status: Single    Spouse name: Not on file  . Number of children: 1  . Years of education: GED  . Highest education level: Not on file  Occupational History    Comment: NA  Social Needs  . Financial resource strain: Not on file  . Food insecurity:    Worry: Not on file    Inability: Not on file  . Transportation needs:     Medical: Not on file    Non-medical: Not on file  Tobacco Use  . Smoking status: Never Smoker  . Smokeless tobacco: Never Used  Substance and Sexual Activity  . Alcohol use: No  . Drug use: No  . Sexual activity: Not Currently    Birth control/protection: Post-menopausal  Lifestyle  . Physical activity:    Days per week: Not on file    Minutes per session: Not on file  . Stress: Not on file  Relationships  . Social connections:    Talks on phone: Not on file    Gets together: Not on file    Attends religious service: Not on file  Active member of club or organization: Not on file    Attends meetings of clubs or organizations: Not on file    Relationship status: Not on file  . Intimate partner violence:    Fear of current or ex partner: Not on file    Emotionally abused: Not on file    Physically abused: Not on file    Forced sexual activity: Not on file  Other Topics Concern  . Not on file  Social History Narrative   08/23/17 Lives with daughter, Rosey Batheresa   Caffeine -coffee occas     PHYSICAL EXAM  GENERAL EXAM/CONSTITUTIONAL: Vitals:  Vitals:   08/23/17 1420  BP: 105/64  Pulse: 69  Weight: 98 lb 6.4 oz (44.6 kg)  Height: 5\' 1"  (1.549 m)     Body mass index is 18.59 kg/m. Wt Readings from Last 3 Encounters:  08/23/17 98 lb 6.4 oz (44.6 kg)  02/22/17 102 lb (46.3 kg)  12/30/16 98 lb (44.5 kg)     Patient is in no distress; well developed, nourished and groomed; neck is supple  CARDIOVASCULAR:  Examination of carotid arteries is normal; no carotid bruits  Regular rate and rhythm, no murmurs  Examination of peripheral vascular system by observation and palpation is normal  EYES:  Ophthalmoscopic exam of optic discs and posterior segments is normal; no papilledema or hemorrhages  No exam data present  MUSCULOSKELETAL:  Gait, strength, tone, movements noted in Neurologic exam below  NEUROLOGIC: MENTAL STATUS:  No flowsheet data  found.  awake, alert, oriented to person, place and time  recent and remote memory intact  normal attention and concentration  language fluent, comprehension intact, naming intact  fund of knowledge appropriate  CRANIAL NERVE:   2nd - no papilledema on fundoscopic exam  2nd, 3rd, 4th, 6th - pupils equal and reactive to light, visual fields full to confrontation, extraocular muscles intact, no nystagmus  5th - facial sensation symmetric  7th - facial strength symmetric  8th - hearing intact  9th - palate elevates symmetrically, uvula midline  11th - shoulder shrug symmetric  12th - tongue protrusion midline\  MASKED FACIES  STUTTERING SPEECH  MOTOR:   INCREASED TONE IN BUE AND BLE; MODERATE-SEVERE BRADYKINESIA IN BUE AND BLE  4/5 full strength in the BUE, BLE  SENSORY:   normal and symmetric to light touch, temperature, vibration  COORDINATION:   finger-nose-finger, fine finger movements SLOW  REFLEXES:   deep tendon reflexes 2+ and symmetric  GAIT/STATION:   STOOPED POSTURE; UNSTEADY; SHORT STEPS; USING WALKER     DIAGNOSTIC DATA (LABS, IMAGING, TESTING) - I reviewed patient records, labs, notes, testing and imaging myself where available.  Lab Results  Component Value Date   WBC 5.7 11/21/2016   HGB 11.8 (L) 11/21/2016   HCT 36.2 11/21/2016   MCV 95.5 11/21/2016   PLT 301 11/21/2016      Component Value Date/Time   NA 136 11/21/2016 1115   K 4.1 11/21/2016 1115   CL 100 (L) 11/21/2016 1115   CO2 26 11/21/2016 1115   GLUCOSE 98 11/21/2016 1115   BUN 25 (H) 11/21/2016 1115   CREATININE 0.81 11/21/2016 1115   CALCIUM 9.9 11/21/2016 1115   PROT 5.8 (L) 10/27/2016 0700   ALBUMIN 2.9 (L) 10/27/2016 0700   AST 15 10/27/2016 0700   ALT 8 (L) 10/27/2016 0700   ALKPHOS 71 10/27/2016 0700   BILITOT 0.7 10/27/2016 0700   GFRNONAA >60 11/21/2016 1115   GFRAA >60 11/21/2016 1115  No results found for: CHOL, HDL, LDLCALC, LDLDIRECT, TRIG,  CHOLHDL No results found for: UJWJ1B No results found for: VITAMINB12 Lab Results  Component Value Date   TSH 2.944 10/27/2016     10/31/15 xray lumbar [I reviewed images myself and agree with interpretation. -VRP]  - Posterior lumbar interbody fusion at L4-L5.  06/11/16 MRI brain [I reviewed images myself and agree with interpretation. -VRP]  1.   T2/FLAIR hyperintense foci in the subcortical and deep white matter of the frontal lobes consistent with altered chronic microvascular ischemic changes.   2.   There are no acute findings.  07/06/16 xray lumbar spine and right hip - No acute abnormality. - Status post L4-5 fusion.     ASSESSMENT AND PLAN  72 y.o. year old female here with gradual onset progressive speech, swallowing, gait difficulty. Exam notable for bradykinesia, cogwheel rigidity, short shuffling steps, postural instability, rare resting tremor. Signs and symptoms consistent with idiopathic Parkinson's disease.    Ddx: parkinsonism (idiopathic parkinson's disease) + low back pain + anxiety  1. Parkinson's disease (HCC)      PLAN:  I spent 15 minutes of face to face time with patient. Greater than 50% of time was spent in counseling and coordination of care with patient. This is necessary because patient's symptoms are not optimally controlled / improved. In summary we discussed: - Diagnostic results, impressions, or recommended diagnostic studies: parkinson's dz - Prognosis: fair - Risks and benefits of management (treatment) options: carb/levo benefit vs side effects  PARKINSON'S DISEASE (est problem; worsening) - increase carb/levo up to 4-6 tab per day (spaced every 4-6 hours) - use walker - continue exercises  Meds ordered this encounter  Medications  . DISCONTD: carbidopa-levodopa (SINEMET IR) 25-100 MG tablet    Sig: Take 1-2 tablets by mouth 3 (three) times daily. TAKE 1 TABLET THREE TIMES A DAY BEFORE MEALS    Dispense:  540 tablet    Refill:  4     **Patient requests 90 days supply**  . carbidopa-levodopa (SINEMET IR) 25-100 MG tablet    Sig: Take 1-2 tablets by mouth 3 (three) times daily.    Dispense:  540 tablet    Refill:  4    **Patient requests 90 days supply**   Return in about 9 months (around 05/24/2018).    Suanne Marker, MD 08/23/2017, 3:06 PM Certified in Neurology, Neurophysiology and Neuroimaging  [facility] [address] [place], [place] [address] [phone]

## 2017-09-22 DIAGNOSIS — N390 Urinary tract infection, site not specified: Secondary | ICD-10-CM | POA: Diagnosis not present

## 2017-09-28 ENCOUNTER — Encounter (HOSPITAL_COMMUNITY): Payer: Self-pay | Admitting: Physical Therapy

## 2017-09-28 NOTE — Therapy (Signed)
[place], [place], [address] Phone: [phone]   Fax:  [phone]  Patient Details  Name: Julie Farrell MRN: [id number] Date of Birth: 1945-04-06 Referring Provider:  No ref. provider found  Encounter Date: 09/28/2017  PHYSICAL THERAPY DISCHARGE SUMMARY  Visits from Start of Care: 4  Current functional level related to goals / functional outcomes: Has not returned to PT, DC    Remaining deficits: Unable to assess    Education / Equipment: None  Plan: Patient agrees to discharge.  Patient goals were not met. Patient is being discharged due to not returning since the last visit.  ?????      Deniece Ree PT, DPT, CBIS  Supplemental Physical Therapist [facility]    Pager [phone] Acute Rehab Office [place] [address] [place], [place], [address] Phone: [phone]   Fax:  [phone]

## 2017-11-09 ENCOUNTER — Ambulatory Visit: Payer: Medicare HMO | Admitting: Urology

## 2017-11-09 DIAGNOSIS — R3915 Urgency of urination: Secondary | ICD-10-CM

## 2017-11-09 DIAGNOSIS — N3941 Urge incontinence: Secondary | ICD-10-CM

## 2017-11-10 DIAGNOSIS — R319 Hematuria, unspecified: Secondary | ICD-10-CM | POA: Diagnosis not present

## 2017-12-29 ENCOUNTER — Telehealth: Payer: Self-pay | Admitting: Diagnostic Neuroimaging

## 2017-12-29 DIAGNOSIS — G20 Parkinson's disease: Secondary | ICD-10-CM

## 2017-12-29 DIAGNOSIS — G20A1 Parkinson's disease without dyskinesia, without mention of fluctuations: Secondary | ICD-10-CM

## 2017-12-29 NOTE — Telephone Encounter (Signed)
Order signed. -VRP

## 2017-12-29 NOTE — Telephone Encounter (Signed)
Patient's daughter Rosey [place]resa ([place]) requesting an order for a wheelchair.  Please call and discuss.

## 2017-12-29 NOTE — Telephone Encounter (Addendum)
Called daughter, Rosey [place]resa on [place] to inquire what type wheelchair she thinks her mother may need and advised her some insurances require a face-to-face evaluation before paying for one.  She stated her mother gets around okay in the house but she would like to have a light weight collapsable one to take her to appointments. She stated her mother has gotten worse since being seen in Aug, and it would be very helpful. This RN discussed checking prices online and if Dr Marjory LiesPenumalli agreed to order, the signed order can be given to her to take to different DME companies for price comparison. Advised this RN will call her back. Rosey Batheresa verbalized understanding, appreciation.

## 2017-12-29 NOTE — Telephone Encounter (Signed)
Called daughter and informed her the wheelchair order is in an envelop and in the mail to her. She verbalized understanding, appreciation.

## 2017-12-29 NOTE — Addendum Note (Signed)
Addended by: Joycelyn SchmidPENUMALLI, VIKRAM R on: 12/29/2017 03:18 PM   Modules accepted: Orders

## 2018-02-08 ENCOUNTER — Telehealth: Payer: Self-pay | Admitting: Diagnostic Neuroimaging

## 2018-02-08 DIAGNOSIS — G20 Parkinson's disease: Secondary | ICD-10-CM

## 2018-02-08 NOTE — Addendum Note (Signed)
Addended by: Guy Begin on: 02/08/2018 04:05 PM   Modules accepted: Orders

## 2018-02-08 NOTE — Telephone Encounter (Signed)
Pt daughter has called, she is asking for a call to request assistance in getting a hospital bed for pt to help prevent pt from falling.  Please call

## 2018-02-08 NOTE — Addendum Note (Signed)
Addended by: Joycelyn Schmid R on: 02/08/2018 04:53 PM   Modules accepted: Orders

## 2018-02-08 NOTE — Telephone Encounter (Signed)
I spoke to Julie Farrell, and they have used [facility].  Would like to see about getting hospital bed for pt in home.  Will send message and let her know once received message back.

## 2018-02-10 NOTE — Telephone Encounter (Signed)
Spoke to [facility] and placed DME order in mail.

## 2018-02-28 ENCOUNTER — Telehealth: Payer: Self-pay | Admitting: Diagnostic Neuroimaging

## 2018-02-28 NOTE — Telephone Encounter (Signed)
Pts daughter states that the RX for a hospital bed needs to be rewritten for [facility] since the place where it originally was written for has closed down. Also daughter states that the pt is needing a wheelchair as well due to the pt getting worse since her last appt. Please advise.

## 2018-02-28 NOTE — Telephone Encounter (Signed)
Called daughter Rosey Bath and advised her I mailed wheelchair order to her Dec and [place] RN mailed hospital bed order to her last month. Rosey Bath said she never got wheelchair order. She stated [facility] closed in [place], and she called [facility] today. She was told orders need to be faxed to them. She requested 17 " wheelchair since patient has a small frame. I advised will take care of orders.  I  sent community message to Evans Lance Snow/ DME with [facility] asking her to pull orders from Epic.

## 2018-03-01 NOTE — Telephone Encounter (Signed)
Yes setup appt. -VRP

## 2018-03-01 NOTE — Telephone Encounter (Signed)
Reply from [facility]:   I can pull the orders but there are some issues that would need to be resolved. After looking at the telephone encounter, it sounds like the daughter is asking for a transport chair instead of a manual wheelchair.  Also, the verbiage that is listed on the orders for both the bed and wheelchair need to be in a signed progress note, not on the order.  Her insurance also requires a signed face to face note speaking to the need for the items within the last 6 months and I'm not showing any office visits in EPIC.

## 2018-03-01 NOTE — Telephone Encounter (Signed)
Called daughter and advised her that her mother needs office visit for face-to-face and new orders per insurance requirements re: hospital bed, wheelchair. Schedule for next Tues; Rosey Bath understands to arrive 30 minutes early, verbalized appreciation.

## 2018-03-08 ENCOUNTER — Encounter: Payer: Self-pay | Admitting: Diagnostic Neuroimaging

## 2018-03-08 ENCOUNTER — Ambulatory Visit: Payer: Medicare HMO | Admitting: Diagnostic Neuroimaging

## 2018-03-08 VITALS — BP 96/65 | HR 82

## 2018-03-08 DIAGNOSIS — G20 Parkinson's disease: Secondary | ICD-10-CM | POA: Diagnosis not present

## 2018-03-08 NOTE — Progress Notes (Addendum)
[facility]  PATIENT: Julie HaggisBonnie F Arcidiacono DOB: 1945/09/21  REFERRING CLINICIAN: Junius ArgyleS Koneswaran HISTORY FROM: patient and daughter  REASON FOR VISIT: follow up    HISTORICAL  CHIEF COMPLAINT:  Chief Complaint  Patient presents with  . Parkinson's disease    rm 6, dgtr- Rosey Batheresa, "face-to-face for hospital bed, wheelchair orders; Lots of saliva, difficulty swallowing at times"  . Follow-up    HISTORY OF PRESENT ILLNESS:   UPDATE (03/08/18, VRP): Since last visit, doing poorly. More slowness and decline in mobility. Family requesting lightweight transport chair and hospital bed. Patient having diff with transferring, even with 1 person assistance. Cannot walk using walker anymore. Mainly in chair and bed. Symptoms are progressive. Tolerating carb/levo 1 tab three times a day (cannot take higher dose due to side effect of fatigue).  UPDATE (08/23/17, VRP): Since last visit, doing worse. Symptoms are moderate. Severity is moderate. No alleviating or aggravating factors. Tolerating carb/levo --> some wearing off in early AM. No falls. Using reg walker.   UPDATE (02/22/17, VRP): Since last visit, doing well, except had left hip fx (Sept 2018), leading to a fall, now s/p repair. Tolerating carb/levo 1 tab three times a day. No alleviating or aggravating factors.  UPDATE 07/20/16: Since last visit, doing about the same. Tried carb/levo, but had dizziness, tired feeling and could not tolerate after 2-3 week trial. Had a fall and went to ER a few weeks ago (tripped on cat). Has gone to PT. Using cane and walker.   PRIOR HPI (05/19/16): 73 year old right-handed female here for evaluation of speech, language, gait difficulty. Patient has had gradual onset, progressive speech, gait, leg difficulty for past 1 year. Patient having soft hoarse voice, difficult to understand, some mild swallowing difficulty. Patient also having issues with small short steps, decreased mobility, decreased movement,  decreased speed of activities. Sometimes she feels her toes draw up on the left side. Patient having decreased facial expressions. Patient having more problems with anxiety and "jitteriness". She is also had 40 pound weight loss over the past one year, unintentionally. Patient imaging generalized fatigue, swelling legs, constipation, not asleep. No family history of tremor, Parkinson's disease or other similar problems. No sudden onset of any symptoms.   REVIEW OF SYSTEMS: Full 14 system review of systems performed and negative with exception of: negative.   ALLERGIES: Allergies  Allergen Reactions  . Vicodin [Hydrocodone-Acetaminophen] Other (See Comments)    Upset stomach    HOME MEDICATIONS: Outpatient Medications Prior to Visit  Medication Sig Dispense Refill  . amLODipine (NORVASC) 5 MG tablet Take 5 mg by mouth daily.   1  . aspirin EC 81 MG tablet Take 81 mg by mouth daily.    . bisacodyl (DULCOLAX) 10 MG suppository Place 1 suppository (10 mg total) rectally daily as needed for moderate constipation. 12 suppository 0  . carbidopa-levodopa (SINEMET IR) 25-100 MG tablet Take 1-2 tablets by mouth 3 (three) times daily. 540 tablet 4  . cholecalciferol (VITAMIN D) 1000 units tablet Take 1 tablet (1,000 Units total) by mouth daily.    Marland Kitchen. docusate sodium (COLACE) 100 MG capsule Take 1 capsule (100 mg total) by mouth 2 (two) times daily. 10 capsule 0  . escitalopram (LEXAPRO) 10 MG tablet Take 1 tablet (10 mg total) by mouth daily. 30 tablet 6  . Ferrous Sulfate (IRON) 325 (65 Fe) MG TABS Take 1 tablet by mouth once a day    . levothyroxine (SYNTHROID, LEVOTHROID) 50 MCG tablet Take 50 mcg by  mouth daily.  0  . LORazepam (ATIVAN) 0.5 MG tablet Take 0.5 mg by mouth every 8 (eight) hours as needed for anxiety.    . OMEGA 3-6-9 FATTY ACIDS PO Take 1 tablet by mouth daily.    Marland Kitchen. oxybutynin (DITROPAN) 5 MG tablet TK 1 T PO BID  11  . polyethylene glycol (MIRALAX / GLYCOLAX) packet Take 17 g by  mouth daily.    . simvastatin (ZOCOR) 20 MG tablet Take 20 mg by mouth at bedtime.    . vitamin B-12 (CYANOCOBALAMIN) 100 MCG tablet Take 100 mcg by mouth daily.    Marland Kitchen. oxyCODONE (OXY IR/ROXICODONE) 5 MG immediate release tablet Take 1 tablet (5 mg total) by mouth every 6 (six) hours as needed for severe pain ((for MODERATE breakthrough pain)). 30 tablet 0   No facility-administered medications prior to visit.     PAST MEDICAL HISTORY: Past Medical History:  Diagnosis Date  . Abnormal pap   . Anxiety   . DDD (degenerative disc disease), lumbar   . Hemorrhoids   . HPV test positive 05/17/2013  . Hyperlipidemia   . Hypertension   . Hypothyroidism   . Parkinson's disease (HCC)   . Thyroid disease   . Vaginal Pap smear, abnormal     PAST SURGICAL HISTORY: Past Surgical History:  Procedure Laterality Date  . BACK SURGERY  10/2015   fusion L4-L 5, Dr Lovell SheehanJenkins  . CATARACT EXTRACTION W/PHACO Left 05/16/2012   Procedure: CATARACT EXTRACTION PHACO AND INTRAOCULAR LENS PLACEMENT (IOC);  Surgeon: Gemma PayorKerry Hunt, MD;  Location: [facility];  Service: Ophthalmology;  Laterality: Left;  CDE:15.20  . COLONOSCOPY  2009   [facility]-Dr. Karilyn Cotaehman  . EYE SURGERY Bilateral 14,15   cataracts  . Fatty Tissue Excision Left 2008   [facility]-Dr. Hilda LiasKeeling arm  . HIP ARTHROPLASTY Left 10/16/2016   Procedure: ARTHROPLASTY BIPOLAR HIP (HEMIARTHROPLASTY);  Surgeon: Vickki HearingHarrison, Stanley E, MD;  Location: [facility];  Service: Orthopedics;  Laterality: Left;  . right ankle Right    fx    FAMILY HISTORY: Family History  Problem Relation Age of Onset  . Heart attack Mother   . Heart disease Mother        CAD  . Heart disease Brother   . Heart attack Brother   . Heart disease Brother   . Heart attack Brother   . Hypertension Father   . Stroke Father     SOCIAL HISTORY:  Social History   Socioeconomic History  . Marital status: Single    Spouse name: Not on file  . Number of children: 1  . Years of education: GED  . Highest  education level: Not on file  Occupational History    Comment: NA  Social Needs  . Financial resource strain: Not on file  . Food insecurity:    Worry: Not on file    Inability: Not on file  . Transportation needs:    Medical: Not on file    Non-medical: Not on file  Tobacco Use  . Smoking status: Never Smoker  . Smokeless tobacco: Never Used  Substance and Sexual Activity  . Alcohol use: No  . Drug use: No  . Sexual activity: Not Currently    Birth control/protection: Post-menopausal  Lifestyle  . Physical activity:    Days per week: Not on file    Minutes per session: Not on file  . Stress: Not on file  Relationships  . Social connections:    Talks on phone: Not on file  Gets together: Not on file    Attends religious service: Not on file    Active member of club or organization: Not on file    Attends meetings of clubs or organizations: Not on file    Relationship status: Not on file  . Intimate partner violence:    Fear of current or ex partner: Not on file    Emotionally abused: Not on file    Physically abused: Not on file    Forced sexual activity: Not on file  Other Topics Concern  . Not on file  Social History Narrative   08/23/17 Lives with daughter, Rosey Bath   Caffeine -coffee occas     PHYSICAL EXAM  GENERAL EXAM/CONSTITUTIONAL: Vitals:  Vitals:   03/08/18 0929  BP: 96/65  Pulse: 82   There is no height or weight on file to calculate BMI. Wt Readings from Last 3 Encounters:  08/23/17 98 lb 6.4 oz (44.6 kg)  02/22/17 102 lb (46.3 kg)  12/30/16 98 lb (44.5 kg)    Patient is in no distress; well developed, nourished and groomed; neck is supple   CARDIOVASCULAR:  Examination of carotid arteries is normal; no carotid bruits  Regular rate and rhythm, no murmurs  Examination of peripheral vascular system by observation and palpation is normal  EYES:  Ophthalmoscopic exam of optic discs and posterior segments is normal; no papilledema or  hemorrhages No exam data present  MUSCULOSKELETAL:  Gait, strength, tone, movements noted in Neurologic exam below  NEUROLOGIC: MENTAL STATUS:  No flowsheet data found.  awake, alert, oriented to person, place and time  recent and remote memory intact  normal attention and concentration  language fluent, comprehension intact, naming intact  fund of knowledge appropriate  CRANIAL NERVE:   2nd - no papilledema on fundoscopic exam  2nd, 3rd, 4th, 6th - pupils equal and reactive to light, visual fields full to confrontation, extraocular muscles intact, no nystagmus  5th - facial sensation symmetric  7th - facial strength symmetric  8th - hearing intact  9th - palate elevates symmetrically, uvula midline  11th - shoulder shrug symmetric  12th - tongue protrusion midline  MASKED FACIES  SEVERE STUTTERING SPEECH; SOFT VOICE  MOTOR:   INCREASED TONE IN BUE AND BLE; MODERATE-SEVERE BRADYKINESIA IN BUE AND BLE  BUE 3/5  BLE 2/5  SENSORY:   normal and symmetric  COORDINATION:   finger-nose-finger, fine finger movements SLOW  REFLEXES:   deep tendon reflexes 2+ and symmetric  GAIT/STATION:   IN TRANSPORT CHAIR; CANNOT STAND     DIAGNOSTIC DATA (LABS, IMAGING, TESTING) - I reviewed patient records, labs, notes, testing and imaging myself where available.  Lab Results  Component Value Date   WBC 5.7 11/21/2016   HGB 11.8 (L) 11/21/2016   HCT 36.2 11/21/2016   MCV 95.5 11/21/2016   PLT 301 11/21/2016      Component Value Date/Time   NA 136 11/21/2016 1115   K 4.1 11/21/2016 1115   CL 100 (L) 11/21/2016 1115   CO2 26 11/21/2016 1115   GLUCOSE 98 11/21/2016 1115   BUN 25 (H) 11/21/2016 1115   CREATININE 0.81 11/21/2016 1115   CALCIUM 9.9 11/21/2016 1115   PROT 5.8 (L) 10/27/2016 0700   ALBUMIN 2.9 (L) 10/27/2016 0700   AST 15 10/27/2016 0700   ALT 8 (L) 10/27/2016 0700   ALKPHOS 71 10/27/2016 0700   BILITOT 0.7 10/27/2016 0700   GFRNONAA  >60 11/21/2016 1115   GFRAA >60 11/21/2016 1115  No results found for: CHOL, HDL, LDLCALC, LDLDIRECT, TRIG, CHOLHDL No results found for: ZOXW9U No results found for: VITAMINB12 Lab Results  Component Value Date   TSH 2.944 10/27/2016     10/31/15 xray lumbar [I reviewed images myself and agree with interpretation. -VRP]  - Posterior lumbar interbody fusion at L4-L5.  06/11/16 MRI brain [I reviewed images myself and agree with interpretation. -VRP]  1.   T2/FLAIR hyperintense foci in the subcortical and deep white matter of the frontal lobes consistent with altered chronic microvascular ischemic changes.   2.   There are no acute findings.  07/06/16 xray lumbar spine and right hip - No acute abnormality. - Status post L4-5 fusion.     ASSESSMENT AND PLAN  73 y.o. year old female here with gradual onset progressive speech, swallowing, gait difficulty. Exam notable for bradykinesia, cogwheel rigidity, short shuffling steps, postural instability, rare resting tremor. Signs and symptoms consistent with idiopathic Parkinson's disease.    Ddx: parkinsonism (idiopathic parkinson's disease) + low back pain + anxiety  1. Parkinson's disease (HCC)      PLAN:   PARKINSON'S DISEASE (est problem; worsening) - CONTINUE carb/levo up to 1 tab three times a day  - continue exercises AT HOME - needs lightweight transport wheelchair and hospital bed - consider home PT evalaution - consider palliative care consult  FACE-FACE EVALUATION  -Patient suffers from Parkinson's disease which impairs ability to perform day-to-day activities over the last 6 months and worsening such as transferring, toileting, feeding, dressing, bathing.  And walker will not resolve the issues performing these activities.  A transport chair allow patient to safely perform daily activities.  Patient not able to propel himself using a standard wheelchair. -Patient also suffers from decreased mobility and needs a  hospital bed for to reduce pain. Patient requires re-positioning of the body in ways that cannot be achieved with an ordinary bed or wedge pillow, to eliminate pain, reduce pressure, or treat existence of pressure sores.  Patient requires the head of the bed to be elevated at least 30 degrees or more most of the time.  Patient requires frequent / immediate changes in body positions which cannot be achieved with a normal bed.   Orders Placed This Encounter  Procedures  . DME Hospital bed  . DME Other see comment   Return in about 1 year (around 03/09/2019).    Suanne Marker, MD 03/08/2018, 10:32 AM Certified in Neurology, Neurophysiology and Neuroimaging  [facility] [address] [place], [place] [address] [phone]

## 2018-03-08 NOTE — Patient Instructions (Signed)
PARKINSON'S DISEASE (est problem; worsening) - CONTINUE carb/levo up to 1 tab three times a day  - continue exercises AT HOME - needs lightweight transport wheelchair and hospital bed - consider home PT evalaution - consider palliative care consult

## 2018-03-14 DIAGNOSIS — R269 Unspecified abnormalities of gait and mobility: Secondary | ICD-10-CM | POA: Diagnosis not present

## 2018-03-14 DIAGNOSIS — S72012A Unspecified intracapsular fracture of left femur, initial encounter for closed fracture: Secondary | ICD-10-CM | POA: Diagnosis not present

## 2018-03-14 DIAGNOSIS — G20 Parkinson's disease: Secondary | ICD-10-CM | POA: Diagnosis not present

## 2018-04-12 DIAGNOSIS — S72012A Unspecified intracapsular fracture of left femur, initial encounter for closed fracture: Secondary | ICD-10-CM | POA: Diagnosis not present

## 2018-04-12 DIAGNOSIS — G20 Parkinson's disease: Secondary | ICD-10-CM | POA: Diagnosis not present

## 2018-04-12 DIAGNOSIS — R269 Unspecified abnormalities of gait and mobility: Secondary | ICD-10-CM | POA: Diagnosis not present

## 2018-05-12 ENCOUNTER — Other Ambulatory Visit: Payer: Self-pay | Admitting: Diagnostic Neuroimaging

## 2018-05-13 DIAGNOSIS — S72012A Unspecified intracapsular fracture of left femur, initial encounter for closed fracture: Secondary | ICD-10-CM | POA: Diagnosis not present

## 2018-05-13 DIAGNOSIS — G20 Parkinson's disease: Secondary | ICD-10-CM | POA: Diagnosis not present

## 2018-05-13 DIAGNOSIS — R269 Unspecified abnormalities of gait and mobility: Secondary | ICD-10-CM | POA: Diagnosis not present

## 2018-05-25 ENCOUNTER — Ambulatory Visit: Payer: Medicare HMO | Admitting: Diagnostic Neuroimaging

## 2018-06-12 DIAGNOSIS — G20 Parkinson's disease: Secondary | ICD-10-CM | POA: Diagnosis not present

## 2018-06-12 DIAGNOSIS — R269 Unspecified abnormalities of gait and mobility: Secondary | ICD-10-CM | POA: Diagnosis not present

## 2018-06-12 DIAGNOSIS — S72012A Unspecified intracapsular fracture of left femur, initial encounter for closed fracture: Secondary | ICD-10-CM | POA: Diagnosis not present

## 2018-07-05 DIAGNOSIS — F419 Anxiety disorder, unspecified: Secondary | ICD-10-CM | POA: Diagnosis not present

## 2018-07-13 DIAGNOSIS — R269 Unspecified abnormalities of gait and mobility: Secondary | ICD-10-CM | POA: Diagnosis not present

## 2018-07-13 DIAGNOSIS — S72012A Unspecified intracapsular fracture of left femur, initial encounter for closed fracture: Secondary | ICD-10-CM | POA: Diagnosis not present

## 2018-07-13 DIAGNOSIS — G20 Parkinson's disease: Secondary | ICD-10-CM | POA: Diagnosis not present

## 2018-08-12 DIAGNOSIS — G20 Parkinson's disease: Secondary | ICD-10-CM | POA: Diagnosis not present

## 2018-08-12 DIAGNOSIS — S72012A Unspecified intracapsular fracture of left femur, initial encounter for closed fracture: Secondary | ICD-10-CM | POA: Diagnosis not present

## 2018-08-12 DIAGNOSIS — R269 Unspecified abnormalities of gait and mobility: Secondary | ICD-10-CM | POA: Diagnosis not present

## 2018-09-12 DIAGNOSIS — R269 Unspecified abnormalities of gait and mobility: Secondary | ICD-10-CM | POA: Diagnosis not present

## 2018-09-12 DIAGNOSIS — S72012A Unspecified intracapsular fracture of left femur, initial encounter for closed fracture: Secondary | ICD-10-CM | POA: Diagnosis not present

## 2018-09-12 DIAGNOSIS — G20 Parkinson's disease: Secondary | ICD-10-CM | POA: Diagnosis not present

## 2018-09-19 DIAGNOSIS — E782 Mixed hyperlipidemia: Secondary | ICD-10-CM | POA: Diagnosis not present

## 2018-09-19 DIAGNOSIS — E039 Hypothyroidism, unspecified: Secondary | ICD-10-CM | POA: Diagnosis not present

## 2018-09-19 DIAGNOSIS — I10 Essential (primary) hypertension: Secondary | ICD-10-CM | POA: Diagnosis not present

## 2018-10-13 DIAGNOSIS — G20 Parkinson's disease: Secondary | ICD-10-CM | POA: Diagnosis not present

## 2018-10-13 DIAGNOSIS — R269 Unspecified abnormalities of gait and mobility: Secondary | ICD-10-CM | POA: Diagnosis not present

## 2018-10-13 DIAGNOSIS — S72012A Unspecified intracapsular fracture of left femur, initial encounter for closed fracture: Secondary | ICD-10-CM | POA: Diagnosis not present

## 2018-11-08 ENCOUNTER — Other Ambulatory Visit: Payer: Self-pay | Admitting: Diagnostic Neuroimaging

## 2018-11-12 DIAGNOSIS — G20 Parkinson's disease: Secondary | ICD-10-CM | POA: Diagnosis not present

## 2018-11-12 DIAGNOSIS — S72012A Unspecified intracapsular fracture of left femur, initial encounter for closed fracture: Secondary | ICD-10-CM | POA: Diagnosis not present

## 2018-11-12 DIAGNOSIS — R269 Unspecified abnormalities of gait and mobility: Secondary | ICD-10-CM | POA: Diagnosis not present

## 2018-12-13 DIAGNOSIS — S72012A Unspecified intracapsular fracture of left femur, initial encounter for closed fracture: Secondary | ICD-10-CM | POA: Diagnosis not present

## 2018-12-13 DIAGNOSIS — G20 Parkinson's disease: Secondary | ICD-10-CM | POA: Diagnosis not present

## 2018-12-13 DIAGNOSIS — R269 Unspecified abnormalities of gait and mobility: Secondary | ICD-10-CM | POA: Diagnosis not present

## 2019-01-12 DIAGNOSIS — S72012A Unspecified intracapsular fracture of left femur, initial encounter for closed fracture: Secondary | ICD-10-CM | POA: Diagnosis not present

## 2019-01-12 DIAGNOSIS — G20 Parkinson's disease: Secondary | ICD-10-CM | POA: Diagnosis not present

## 2019-01-12 DIAGNOSIS — R269 Unspecified abnormalities of gait and mobility: Secondary | ICD-10-CM | POA: Diagnosis not present

## 2019-02-12 DIAGNOSIS — R269 Unspecified abnormalities of gait and mobility: Secondary | ICD-10-CM | POA: Diagnosis not present

## 2019-02-12 DIAGNOSIS — G20 Parkinson's disease: Secondary | ICD-10-CM | POA: Diagnosis not present

## 2019-02-12 DIAGNOSIS — S72012A Unspecified intracapsular fracture of left femur, initial encounter for closed fracture: Secondary | ICD-10-CM | POA: Diagnosis not present

## 2019-02-20 DEATH — deceased

## 2019-03-11 IMAGING — DX DG HIP (WITH OR WITHOUT PELVIS) 2-3V*R*
3 series · 3 of 3 positions shown · non-contrast
Comparison: Right hip radiographs from 09/09/2015

CLINICAL DATA: Status post fall onto right side, with right hip
pain. Initial encounter.

EXAM:
DG HIP (WITH OR WITHOUT PELVIS) 2-3V RIGHT

[pelvis ap]
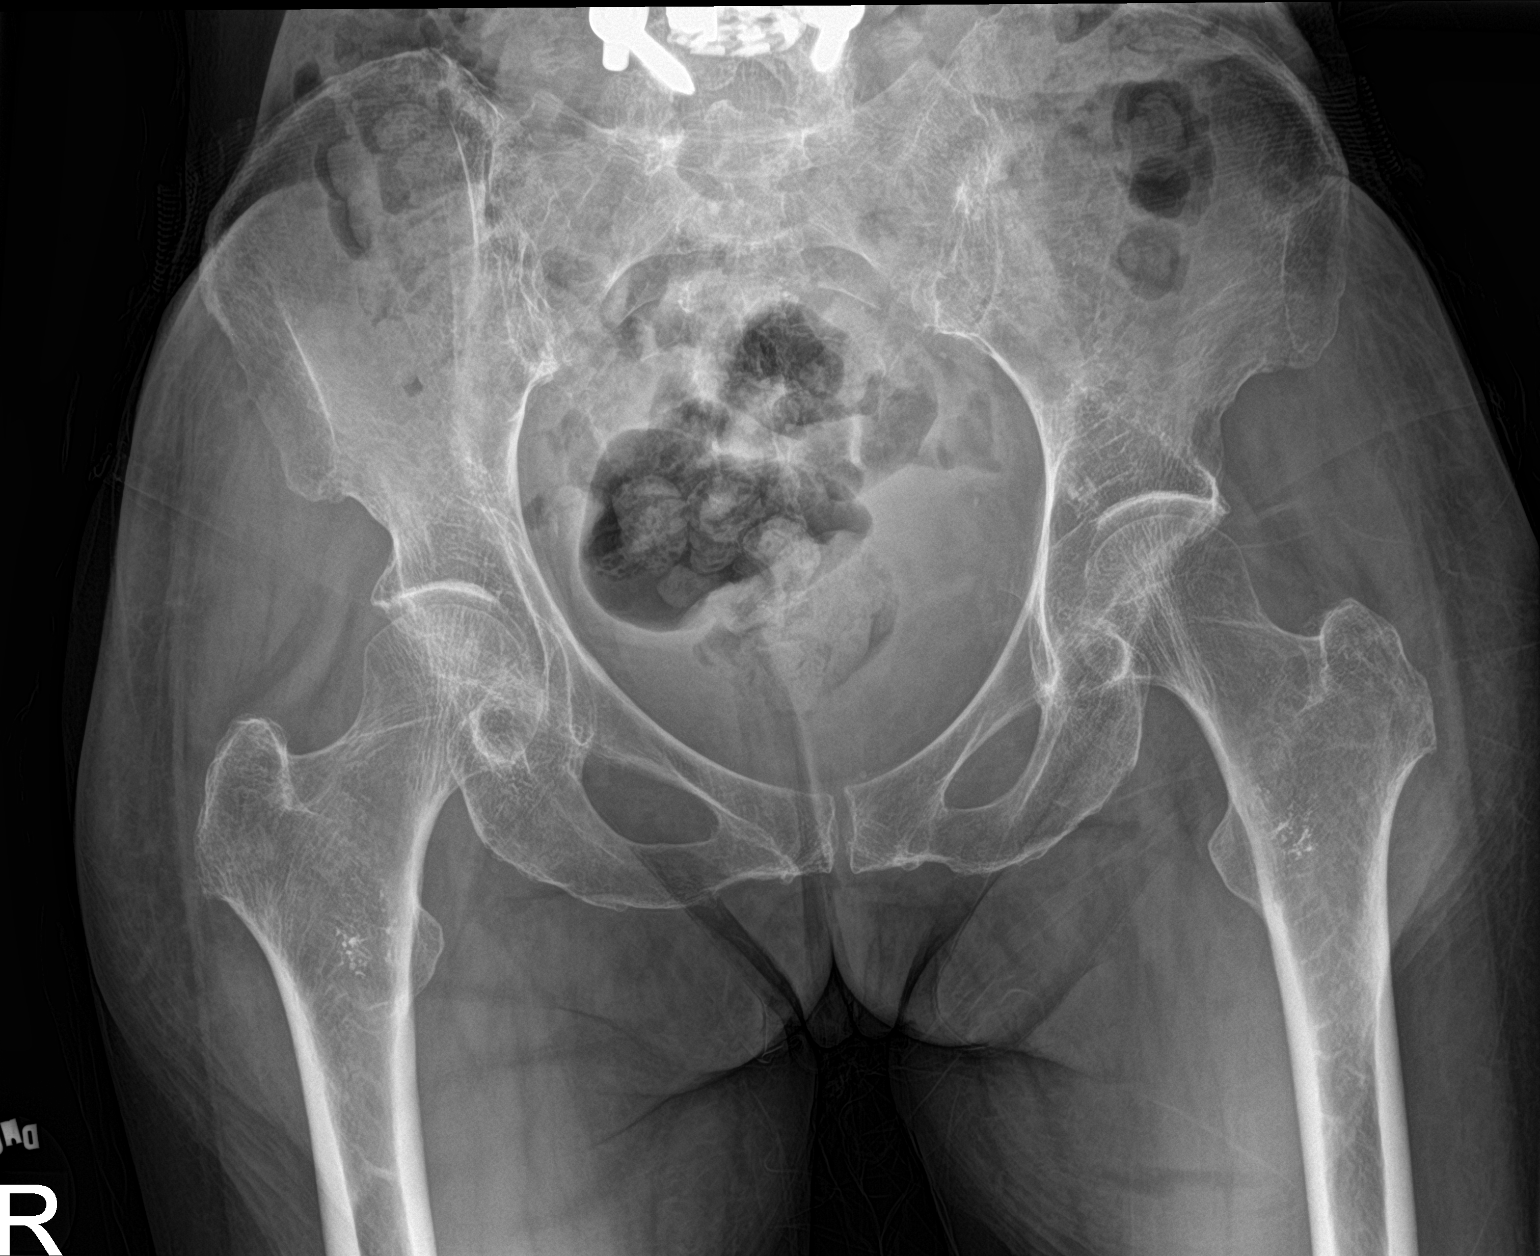

[hip ap]
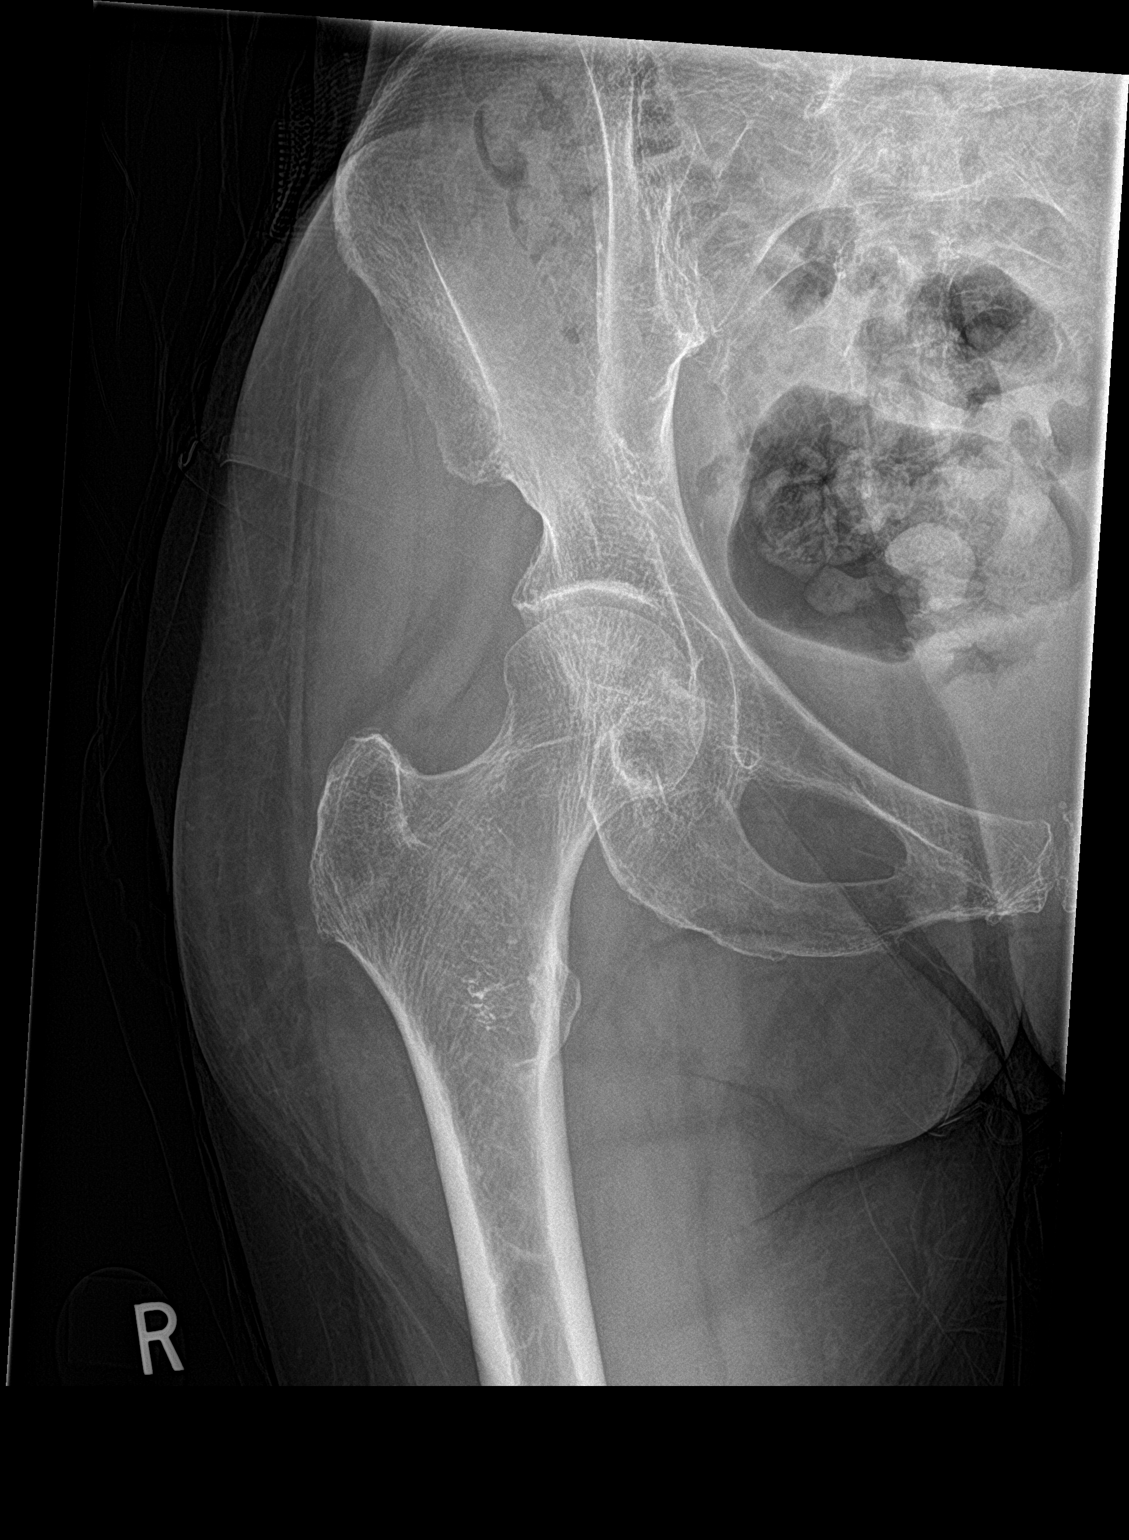

[hip lat]
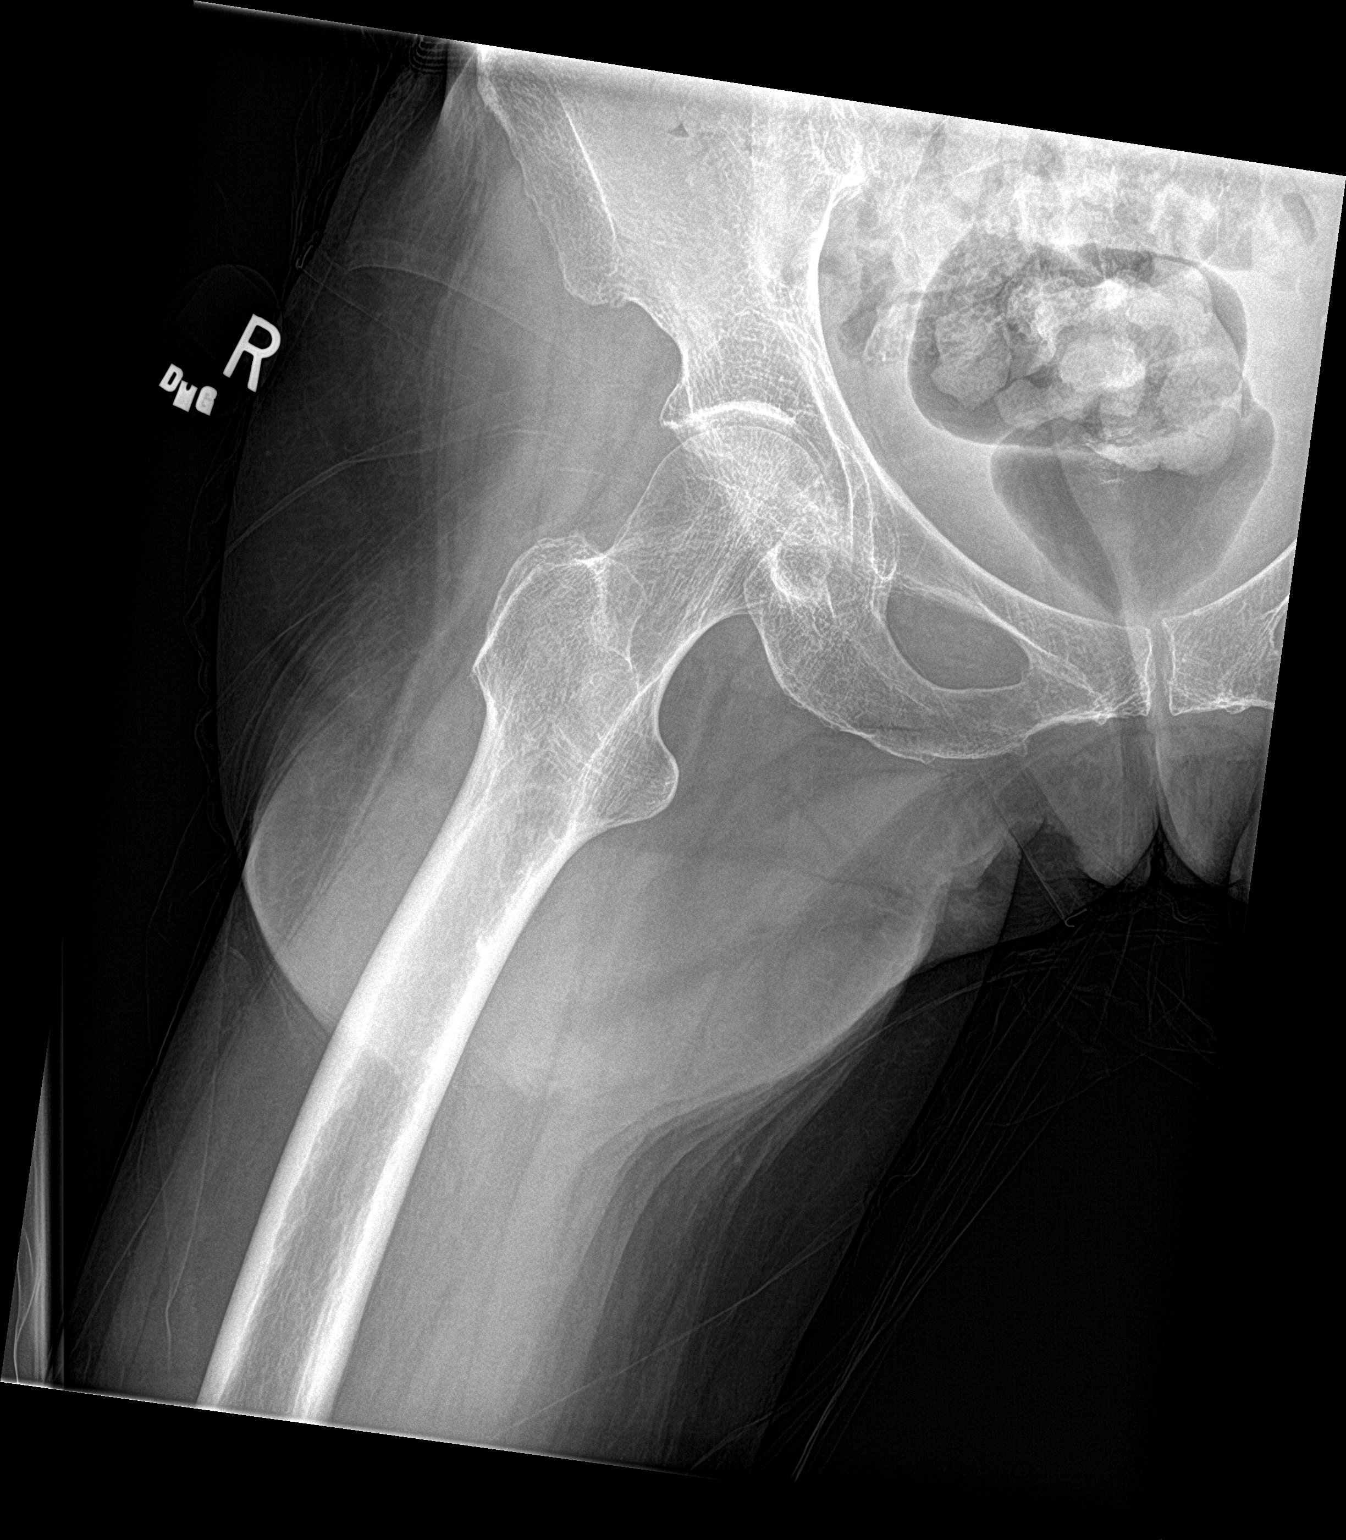

[3 of 3 positions shown; findings below may reference images not displayed]

FINDINGS: There is no evidence of fracture or dislocation. Both femoral heads
are seated normally within their respective acetabula. The proximal
right femur appears intact. No significant degenerative change is
appreciated. The sacroiliac joints are unremarkable in appearance.
Lumbar spinal fusion hardware is partially imaged.

The visualized bowel gas pattern is grossly unremarkable in
appearance.
IMPRESSION: No evidence of fracture or dislocation.

## 2019-03-11 IMAGING — DX DG LUMBAR SPINE 2-3V
3 series · 3 of 3 positions shown · non-contrast
Comparison: CT abdomen and pelvis 06/20/2016. Plain films lumbar
spine performed at [HOSPITAL] [REDACTED] 02/21/2016.

CLINICAL DATA: Low back pain due to a slip and fall doing laundry
today. Initial encounter.

EXAM:
LUMBAR SPINE - 2-3 VIEW

[l-spine ap]
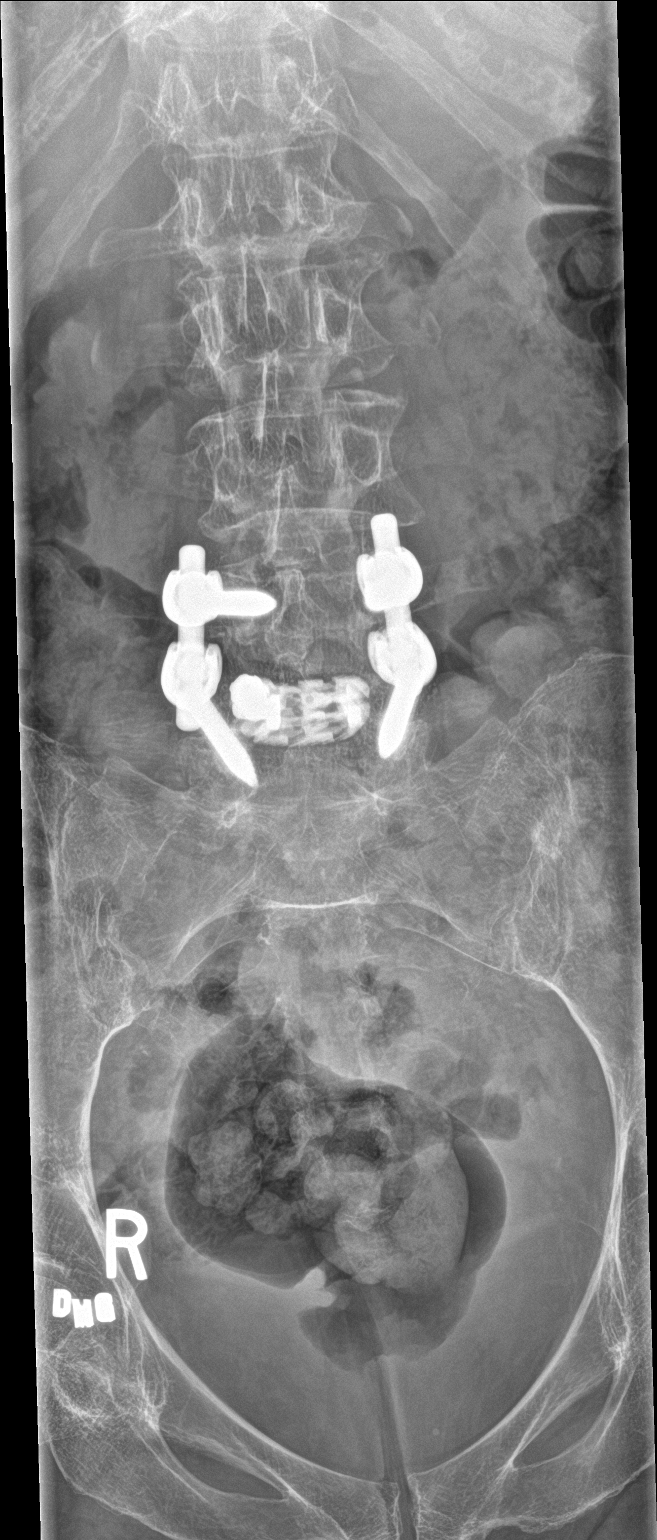

[l-spine lat]
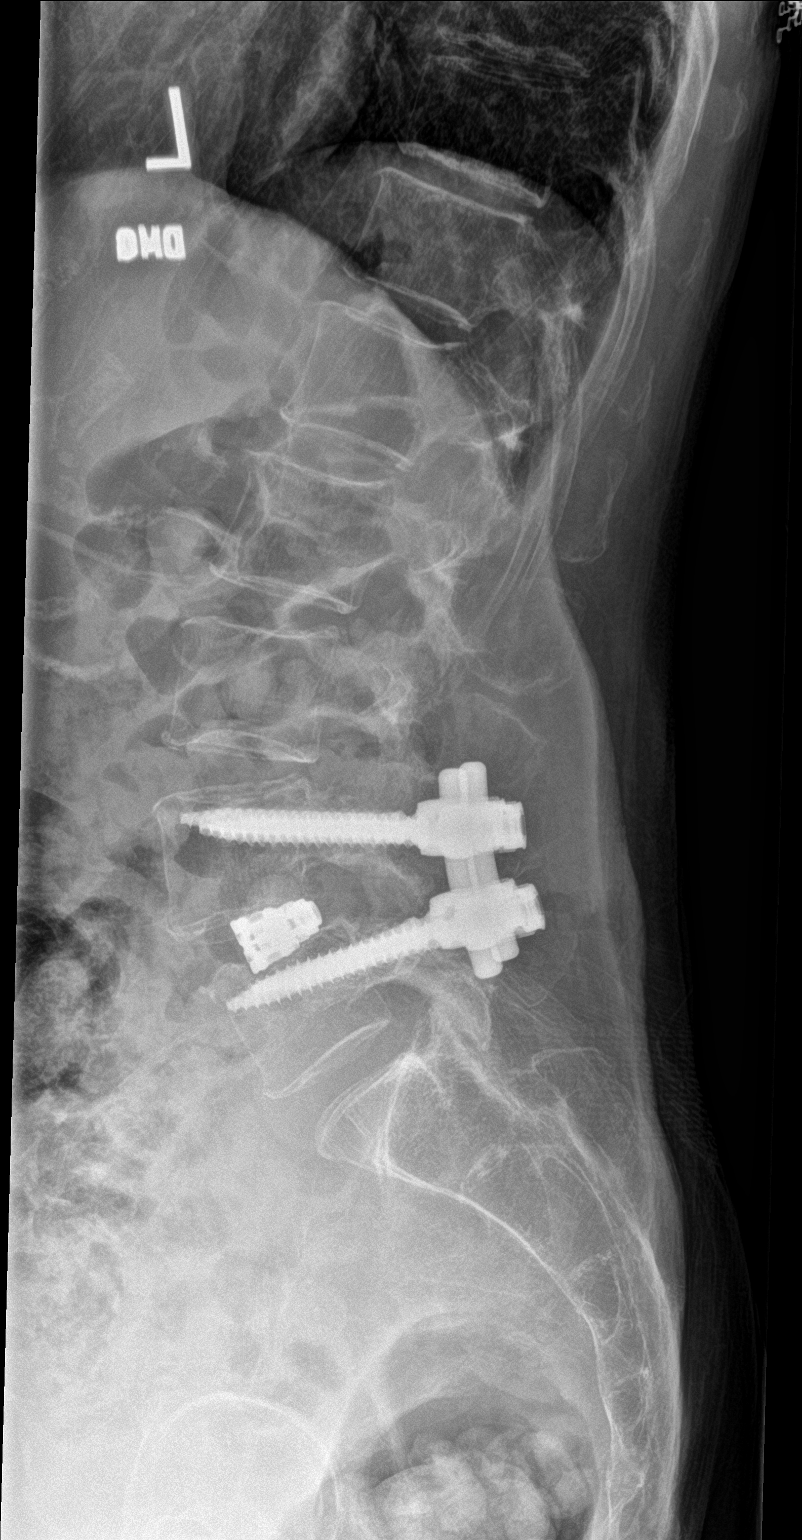

[l-spine spot]
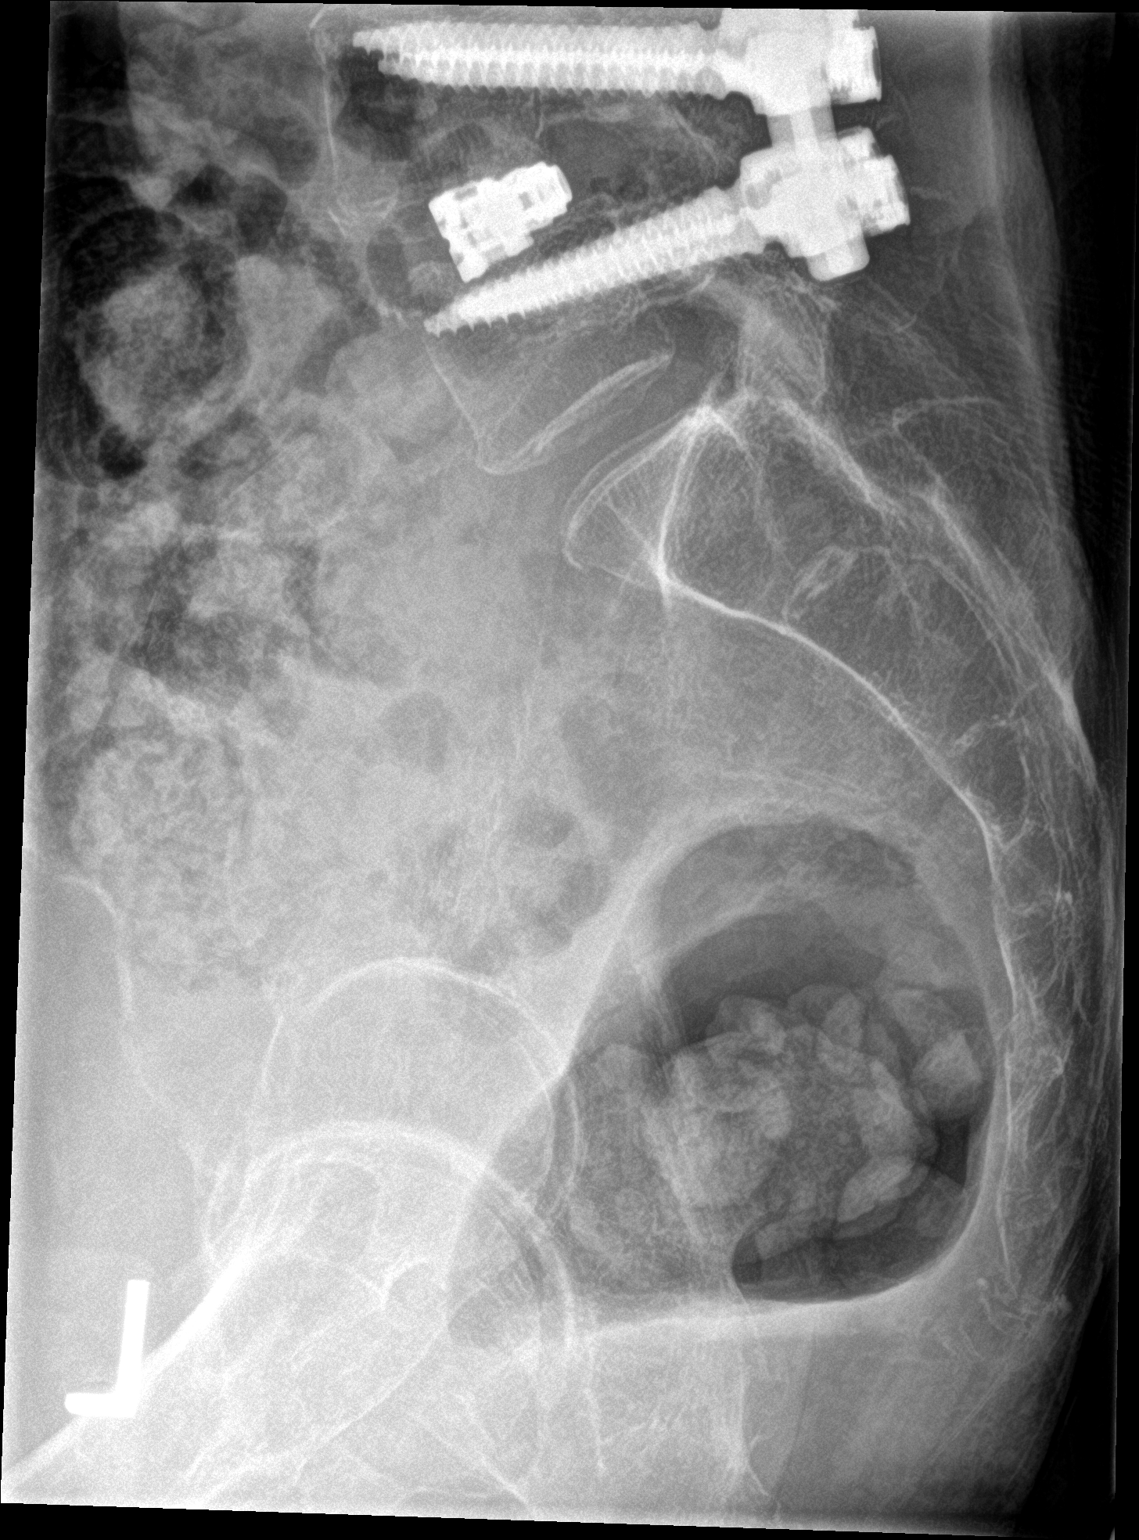

[3 of 3 positions shown; findings below may reference images not displayed]

FINDINGS: No fracture is identified. The patient is status post L4-5 fusion.
Grade 1 anterolisthesis L4 on L5 is unchanged. Alignment is
otherwise unremarkable. Paraspinous structures appear normal.
IMPRESSION: No acute abnormality.

Status post L4-5 fusion.

## 2019-03-14 ENCOUNTER — Ambulatory Visit: Payer: Medicare HMO | Admitting: Diagnostic Neuroimaging
